# Patient Record
Sex: Female | Born: 1960 | Race: Black or African American | Hispanic: No | Marital: Married | Smoking: Former smoker
Health system: Southern US, Community
[De-identification: ages and names within clinical notes are randomized; demographics above are authoritative.]

## PROBLEM LIST (undated history)

## (undated) DIAGNOSIS — M3320 Polymyositis, organ involvement unspecified: Secondary | ICD-10-CM

## (undated) DIAGNOSIS — E785 Hyperlipidemia, unspecified: Secondary | ICD-10-CM

## (undated) DIAGNOSIS — IMO0001 Reserved for inherently not codable concepts without codable children: Secondary | ICD-10-CM

## (undated) DIAGNOSIS — I2510 Atherosclerotic heart disease of native coronary artery without angina pectoris: Secondary | ICD-10-CM

## (undated) DIAGNOSIS — M1990 Unspecified osteoarthritis, unspecified site: Secondary | ICD-10-CM

## (undated) DIAGNOSIS — F32A Depression, unspecified: Secondary | ICD-10-CM

## (undated) DIAGNOSIS — J189 Pneumonia, unspecified organism: Secondary | ICD-10-CM

## (undated) DIAGNOSIS — G7100 Muscular dystrophy, unspecified: Secondary | ICD-10-CM

## (undated) DIAGNOSIS — F329 Major depressive disorder, single episode, unspecified: Secondary | ICD-10-CM

## (undated) DIAGNOSIS — K219 Gastro-esophageal reflux disease without esophagitis: Secondary | ICD-10-CM

## (undated) DIAGNOSIS — K635 Polyp of colon: Secondary | ICD-10-CM

## (undated) DIAGNOSIS — Z8742 Personal history of other diseases of the female genital tract: Secondary | ICD-10-CM

## (undated) DIAGNOSIS — M810 Age-related osteoporosis without current pathological fracture: Secondary | ICD-10-CM

## (undated) DIAGNOSIS — R079 Chest pain, unspecified: Secondary | ICD-10-CM

## (undated) DIAGNOSIS — D649 Anemia, unspecified: Secondary | ICD-10-CM

## (undated) DIAGNOSIS — I10 Essential (primary) hypertension: Secondary | ICD-10-CM

## (undated) DIAGNOSIS — K5790 Diverticulosis of intestine, part unspecified, without perforation or abscess without bleeding: Secondary | ICD-10-CM

## (undated) DIAGNOSIS — R7303 Prediabetes: Secondary | ICD-10-CM

## (undated) HISTORY — DX: Atherosclerotic heart disease of native coronary artery without angina pectoris: I25.10

## (undated) HISTORY — PX: ENDOMETRIAL ABLATION: SHX621

## (undated) HISTORY — PX: LEEP: SHX91

## (undated) HISTORY — PX: COLONOSCOPY W/ BIOPSIES: SHX1374

## (undated) HISTORY — DX: Gastro-esophageal reflux disease without esophagitis: K21.9

## (undated) HISTORY — DX: Diverticulosis of intestine, part unspecified, without perforation or abscess without bleeding: K57.90

## (undated) HISTORY — DX: Hyperlipidemia, unspecified: E78.5

## (undated) HISTORY — DX: Personal history of other diseases of the female genital tract: Z87.42

## (undated) HISTORY — DX: Polyp of colon: K63.5

## (undated) HISTORY — DX: Prediabetes: R73.03

---

## 1998-07-06 ENCOUNTER — Ambulatory Visit (HOSPITAL_COMMUNITY): Admission: RE | Admit: 1998-07-06 | Discharge: 1998-07-06 | Payer: Self-pay | Admitting: *Deleted

## 1998-07-11 ENCOUNTER — Other Ambulatory Visit: Admission: RE | Admit: 1998-07-11 | Discharge: 1998-07-11 | Payer: Self-pay | Admitting: *Deleted

## 1998-09-02 ENCOUNTER — Other Ambulatory Visit: Admission: RE | Admit: 1998-09-02 | Discharge: 1998-09-02 | Payer: Self-pay | Admitting: *Deleted

## 1999-03-09 ENCOUNTER — Other Ambulatory Visit: Admission: RE | Admit: 1999-03-09 | Discharge: 1999-03-09 | Payer: Self-pay | Admitting: *Deleted

## 1999-03-10 ENCOUNTER — Encounter (INDEPENDENT_AMBULATORY_CARE_PROVIDER_SITE_OTHER): Payer: Self-pay

## 1999-03-10 ENCOUNTER — Other Ambulatory Visit: Admission: RE | Admit: 1999-03-10 | Discharge: 1999-03-10 | Payer: Self-pay | Admitting: *Deleted

## 1999-06-12 ENCOUNTER — Encounter (INDEPENDENT_AMBULATORY_CARE_PROVIDER_SITE_OTHER): Payer: Self-pay

## 1999-06-12 ENCOUNTER — Observation Stay (HOSPITAL_COMMUNITY): Admission: RE | Admit: 1999-06-12 | Discharge: 1999-06-13 | Payer: Self-pay | Admitting: *Deleted

## 2000-05-07 ENCOUNTER — Encounter: Admission: RE | Admit: 2000-05-07 | Discharge: 2000-05-28 | Payer: Self-pay | Admitting: Internal Medicine

## 2000-06-21 ENCOUNTER — Encounter (INDEPENDENT_AMBULATORY_CARE_PROVIDER_SITE_OTHER): Payer: Self-pay | Admitting: *Deleted

## 2000-06-21 ENCOUNTER — Ambulatory Visit (HOSPITAL_COMMUNITY): Admission: RE | Admit: 2000-06-21 | Discharge: 2000-06-21 | Payer: Self-pay | Admitting: Gastroenterology

## 2000-11-20 ENCOUNTER — Other Ambulatory Visit: Admission: RE | Admit: 2000-11-20 | Discharge: 2000-11-20 | Payer: Self-pay | Admitting: *Deleted

## 2001-12-10 ENCOUNTER — Other Ambulatory Visit: Admission: RE | Admit: 2001-12-10 | Discharge: 2001-12-10 | Payer: Self-pay | Admitting: *Deleted

## 2002-12-30 ENCOUNTER — Other Ambulatory Visit: Admission: RE | Admit: 2002-12-30 | Discharge: 2002-12-30 | Payer: Self-pay | Admitting: *Deleted

## 2003-04-18 ENCOUNTER — Encounter: Payer: Self-pay | Admitting: Hematology and Oncology

## 2003-04-18 ENCOUNTER — Encounter: Payer: Self-pay | Admitting: Emergency Medicine

## 2003-04-18 ENCOUNTER — Inpatient Hospital Stay (HOSPITAL_COMMUNITY): Admission: EM | Admit: 2003-04-18 | Discharge: 2003-04-21 | Payer: Self-pay | Admitting: Emergency Medicine

## 2003-05-17 ENCOUNTER — Encounter: Payer: Self-pay | Admitting: Emergency Medicine

## 2003-05-17 ENCOUNTER — Emergency Department (HOSPITAL_COMMUNITY): Admission: EM | Admit: 2003-05-17 | Discharge: 2003-05-18 | Payer: Self-pay | Admitting: Emergency Medicine

## 2003-07-07 ENCOUNTER — Ambulatory Visit (HOSPITAL_COMMUNITY): Admission: RE | Admit: 2003-07-07 | Discharge: 2003-07-08 | Payer: Self-pay | Admitting: General Surgery

## 2003-07-07 ENCOUNTER — Encounter (INDEPENDENT_AMBULATORY_CARE_PROVIDER_SITE_OTHER): Payer: Self-pay | Admitting: Specialist

## 2004-03-29 ENCOUNTER — Ambulatory Visit (HOSPITAL_COMMUNITY): Admission: RE | Admit: 2004-03-29 | Discharge: 2004-03-29 | Payer: Self-pay | Admitting: Gastroenterology

## 2004-03-29 ENCOUNTER — Encounter (INDEPENDENT_AMBULATORY_CARE_PROVIDER_SITE_OTHER): Payer: Self-pay | Admitting: *Deleted

## 2004-08-27 HISTORY — PX: LEEP: SHX91

## 2007-04-29 ENCOUNTER — Encounter: Admission: RE | Admit: 2007-04-29 | Discharge: 2007-04-29 | Payer: Self-pay | Admitting: Obstetrics and Gynecology

## 2007-05-04 ENCOUNTER — Encounter: Admission: RE | Admit: 2007-05-04 | Discharge: 2007-05-04 | Payer: Self-pay | Admitting: Interventional Radiology

## 2007-05-27 ENCOUNTER — Encounter: Admission: RE | Admit: 2007-05-27 | Discharge: 2007-05-27 | Payer: Self-pay | Admitting: Interventional Radiology

## 2008-05-19 ENCOUNTER — Ambulatory Visit (HOSPITAL_COMMUNITY): Admission: RE | Admit: 2008-05-19 | Discharge: 2008-05-20 | Payer: Self-pay | Admitting: Interventional Radiology

## 2008-05-19 HISTORY — PX: UTERINE ARTERY EMBOLIZATION: SHX2629

## 2008-06-09 ENCOUNTER — Encounter: Admission: RE | Admit: 2008-06-09 | Discharge: 2008-06-09 | Payer: Self-pay | Admitting: Interventional Radiology

## 2008-12-29 ENCOUNTER — Encounter: Admission: RE | Admit: 2008-12-29 | Discharge: 2008-12-29 | Payer: Self-pay | Admitting: Obstetrics and Gynecology

## 2008-12-29 ENCOUNTER — Encounter: Admission: RE | Admit: 2008-12-29 | Discharge: 2008-12-29 | Payer: Self-pay | Admitting: Interventional Radiology

## 2010-03-17 ENCOUNTER — Encounter: Admission: RE | Admit: 2010-03-17 | Discharge: 2010-03-17 | Payer: Self-pay | Admitting: Obstetrics and Gynecology

## 2010-09-17 ENCOUNTER — Encounter: Payer: Self-pay | Admitting: Interventional Radiology

## 2010-12-18 ENCOUNTER — Other Ambulatory Visit: Payer: Self-pay | Admitting: Obstetrics and Gynecology

## 2010-12-18 DIAGNOSIS — Z1231 Encounter for screening mammogram for malignant neoplasm of breast: Secondary | ICD-10-CM

## 2011-01-12 NOTE — Procedures (Signed)
[place]  Patient:    Jessica Burke, Jessica Burke                    MRN: [id number] Proc. Date: 06/21/00 Adm. Date:  [id number] Attending:  Charna Elizabeth CC:         Jenel Lucks, M.D., [facility].   Procedure Report  DATE OF BIRTH:  08-Apr-1961  PROCEDURE PERFORMED:  Colonoscopy with snare polypectomy x 2 and hot biopsy x 1.  ENDOSCOPIST:  Anselmo Rod, M.D.  INSTRUMENT USED:  Olympus video colonoscope.  INDICATIONS:  Rectal bleeding in a 50 year old black female.  Rule out colonic polyps, masses, hemorrhoids, etc.  PREPROCEDURE PREPARATION:  Informed consent was procured from the patient. The patient was fasted for 8 hours prior to the procedure and prepped with a bottle of magnesium citrate and a gallon of NuLytely the night prior to the procedure.  PREPROCEDURE PHYSICAL:  Patient has stable vital signs.  NECK: Supple.  CHEST:  Clear to auscultation. S1, S2 regular.  ABDOMEN:  Soft with normal abdominal bowel sounds.  DESCRIPTION OF PROCEDURE:  The patient was placed in left lateral decubitus position and sedated with 40 mg of Demerol and 4 mg of Versed intravenously. Once the patient was adequately sedated and maintained on low-flow oxygen and continuous cardiac monitoring, the Olympus video colonoscope was advanced from the rectum to the cecum without difficulty.  The patient has large pedunculated polyp at the hepatic flexure that was snared x 2.  Initially one-half of the polyp was removed and then the second half was removed without difficulty.  There was no bleeding from the stump.  A small sessile polyp was removed by hot biopsy from the right colon distal to the cecum.  There was a large amount of residual stool in the colon.  Very small lesions may have been missed.  IMPRESSION: 1. Large pedunculated polyp at the hepatic flexure. 2. Small sessile polyp in the right colon distal to the  cecum.  RECOMMENDATIONS: 1. Await pathology results. 2. Avoid nonsteroidals for the next two weeks. 3. Outpatient follow-up in the next two weeks. DD:  06/21/00 TD:  06/22/00 Job: [id number]

## 2011-01-12 NOTE — Consult Note (Signed)
NAME:  Jessica Burke, Jessica Burke NO.:  [id number]   MEDICAL RECORD NO.:  [id number]                   PATIENT TYPE:  INP   LOCATION:  0101                                 FACILITY:  [facility]   PHYSICIAN:  Erasmo Leventhal, M.D.         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  She slipped in the  water at home today and suffered a straddle-type injury to her groin; she  basically did a split.  She had pelvic pain with difficulty ambulating, and  was brought to the emergency room by ambulance.  There she was evaluated  initially and admitted to the hospitalist.  Initial x-rays were negative.  CT scan was suggestive of possible fracture, and I was consulted.  There was  no neck pain, loss of consciousness, or head injury with this.  It was a  simple fall.   MEDICAL AND SURGICAL HISTORY:  Please see documented.   PHYSICAL EXAMINATION:  GENERAL:  The patient is awake and alert, answers  questions appropriately, oriented to person, place, time and circumstance.  Denies any head, neck, shoulder, or upper extremity pain.  No pain in the  left lower extremity.  Mostly points to the suprapubic region as being the  area of pain, and also in the inner groin region, both sides.  NECK:  Unremarkable.  Bilateral upper extension moves unremarkable.  Left  lower extension moves well.  Right lower extension moves, but with guarding  somewhat.  PELVIC:  Compression reveals it to be stable.  No obvious ecchymosis or  swelling, with chaperone the entire exam.  The groin ridge was palpated, no  palpable defect.  No significant swelling, but mild tenderness there.  EXTREMITIES:  Distal pulses were intact.  Moves feet and ankles very well.  Good sensation to  light touch.   X-RAYS:  Plain x-rays of the pelvis unremarkable, as was her hip.   CT SCAN:  Suggestive of possible nondisplaced right pubic fracture.  Posterior elements appear to be intact.   IMPRESSION:  Bilateral groin, hip adductor strain, probable nondisplaced  right pubic fracture.   RECOMMENDATIONS:  At this point in time, these are stable injuries - the  strains and the pubic fracture.  She may be weightbearing as tolerated.  She  will be admitted for pain control to the hospitalist, physical therapy, OT  consults, weightbearing as tolerated, pain control.  Also recommend for deep  vein thrombosis prophylaxis while in the bed.  Bilateral knee-high TED hose,  and bilateral pedal PlexiPulse, and also recommend calcium at 500 mg three  times a day with food, and vitamin D 400 international units per day.  Pain  control per hospitalist.  Will follow  her in the hospital.  Once her pain is  relieved, and she can satisfactorily move about, would recommend discharge  to home.  Will follow her in the office.  All questions encouraged and  answered for patient.                                               Erasmo Leventhal, M.D.    RAC/MEDQ  D:  04/18/2003  T:  04/18/2003  Job:  [id number]

## 2011-01-12 NOTE — Op Note (Signed)
NAME:  Jessica Burke, Jessica Burke NO.:  [id number]   MEDICAL RECORD NO.:  [id number]                   PATIENT TYPE:  OIB   LOCATION:  5735                                 FACILITY:  [facility]   PHYSICIAN:  Leonie Man, M.D.                DATE OF BIRTH:  [birth year]   DATE OF PROCEDURE:  07/07/2003  DATE OF DISCHARGE:                                 OPERATIVE REPORT   PREOPERATIVE DIAGNOSIS:  Chronic calculous cholecystitis, symptomatic.   POSTOPERATIVE DIAGNOSIS:  Chronic calculous cholecystitis, symptomatic.   OPERATION PERFORMED:  Laparoscopic cholecystectomy with intraoperative  cholangiogram.   SURGEON:  Leonie Man, M.D.   ASSISTANT:  Joanne Gavel, M.D.   ANESTHESIA:  General.   INDICATIONS FOR PROCEDURE:  The patient is a 50 year old woman who presents  with abdominal pain extending from the epigastrium to the right upper  quadrant associated with nausea and vomiting.  Gallbladder ultrasound shows  stones.  She comes to the operating room now after the risks and potential  benefits of surgery have been fully discussed, all questions answered and  consent obtained.   DESCRIPTION OF PROCEDURE:  Following the induction of satisfactory general  anesthesia, the patient was positioned supinely.  The abdomen was routinely  prepped and draped to be included in the sterile operative field.  Open  laparoscopy created at the umbilicus with insertion of a Hasson cannula and  insufflation of the peritoneal cavity to 15 mmHg using carbon dioxide.  Visual exploration of the abdomen carried out.  Liver surface was smooth.  The liver edge was sharp.  Anterior gastric wall appeared to be normal.  Duodenal sweep appeared to be normal.  There were some adhesions to the  gallbladder from the duodenum.  None of the small or large intestine viewed  appeared to be abnormal.  Under direct vision, epigastric and lateral ports  were placed.  The gallbladder was grasped and  retracted cephalad and  dissection carried down to the region of the ampulla.  A fifth port had to  be placed so as to get better visualization of the ampullary region.  The  cystic duct was then isolated and was noted to be a very large, thick-walled  cystic duct with a stone wedged within it.  The cystic artery was also  identified and in the course of isolating the cystic artery, the cystic  artery was entered and the bleeding was subsequently controlled with clips.  The large cystic duct was then opened.  Multiple stones within the cystic  duct were milked out and the cystic duct was irrigated.  A cystic duct  cholangiogram was carried out by passing a Cook catheter into the abdomen  and placing it into the cystic duct. Since the duct cholangiogram was  carried out with one half strength Hypaque being injected into the  extrahepatic biliary system.  The resulting cholangiogram showed normal  caliber duct with no  filling defects identified.  The cystic duct catheter  was removed and the cystic duct was doubly clipped and then an Endoloop was  placed around the __________ very large cystic duct.  The gallbladder was  then dissected free from the liver bed by using electrocautery and  maintaining hemostasis throughout the entire course of the dissection.  During the course of the dissection, the gallbladder was opened and multiple  stones were extruded, so after the gallbladder was removed and placed in an  EndoCatch, the stones were retrieved using irrigation and stone scooper to  retrieve the stones.  At the end of the procedure, the liver bed was again  inspected and noted to be dry.  The camera was moved to the epigastric port  and the gallbladder was retrieved through the umbilical port without  difficulty.  Sponge, instrument and sharp counts were then verified.  Pneumoperitoneum allowed to deflate and the wounds closed in layers as  follows.  Umbilical wound in two layers with 0  Vicryl and 4-0 Monocryl.  Epigastric and lateral flank wounds as well as the addition ports were  closed with running 4-0 Monocryl sutures.  All wounds were reinforced with  Steri-Strips and sterile dressings applied.  Anesthetic was reversed and the  patient removed from the operating room to the recovery room in stable  condition, having tolerated the procedure well.  She tolerated the procedure  well.                                               Leonie Man, M.D.    PB/MEDQ  D:  07/07/2003  T:  07/08/2003  Job:  [id number]

## 2011-01-12 NOTE — H&P (Signed)
NAME:  Jessica Burke, Jessica Burke                       ACCOUNT NO.:  [id number]   MEDICAL RECORD NO.:  [id number]                   PATIENT TYPE:  INP   LOCATION:  0101                                 FACILITY:  [facility]   PHYSICIAN:  Alfonse Spruce, M.D.               DATE OF BIRTH:  Oct 16, 1960   DATE OF ADMISSION:  04/18/2003  DATE OF DISCHARGE:                                HISTORY & PHYSICAL   PRIMARY CARE PHYSICIAN RHEUMATOLOGIST:  Lemmie Evens, M.D.   HISTORY AND CHIEF COMPLAINT:  The patient admitted through the emergency  department, brought by ambulance to the emergency room after she had a fall  in her kitchen with split legs.  With split legs, landed on her pelvis  anteriorly.   HISTORY OF PRESENT ILLNESS:  Jessica Burke who is a 50 year old African-  American female with a longstanding history of polymyositis, has been  treated by Dr. Jimmy Footman with multiple medications and has been checking on  her every six weeks.  Her current primary care physician is Dr. Juline Patch.  She stated that she went down the stairs to the kitchen and because  of the water from the dishwasher which overflowed, she slipped and landed on  her pelvis with open split legs and she could not move or stand up, with  severe pain, for 20 minutes.  Her husband came down and her daughter helped  her and were able to carry her up to her bed.  They gave her some Tylenol  and she tried to sleep, could not with the severe pain.  She wanted to go to  urinate, and she had significant difficulty going to the bathroom, and  although she had a history of weakness of the muscles of the pelvis with  incontinence associated with longstanding history of polymyositis, for  almost a 20 year history of polymyositis.   CURRENT MEDICATIONS:  1. Medrol 4 mg.  2. Hydrochlorothiazide 25 mg, she takes 1/2 tablet q.a.m. for mild     hypertension.  3. Azathioprine 50 mg, she takes 200 mg a day, equal to 4 tablets.  4. She  also takes Enbrel 25 mg subcu twice a week, the last one was done on     Friday 04/16/2003.   I believe the  polymyositis was diagnosed in 1997.   FAMILY HISTORY:  She is married.  She is gravida 1, para 1.  She has two  brothers who are healthy.  Her mother lived a life and had some knee  problem, mild hypertension.  Her father died of IHSS heart disease at the  age of 29.   SOCIAL HISTORY:  She is married and is not a smoker and nondrinker.  She  retired with disability for the [employer].  She is gravida 1, para 1 with  the bikini incision for cesarean section.  She has one daughter.  She had  prior  to the treatment, she had apparent weakness and frequent falls, but  she went to the therapy and she got a little better.  She has no falls since  last year.  There is no history of diabetes mellitus, no history of peptic  ulcer disease.  She has a mild history of hypertension.  She had no thyroid  disease and no other problem.  Currently she is on her menses from the 2nd  day and usually lasts 3 days, moderate and 2-3 spotting, and history of  endometrial proliferation.  She had a muscle biopsy for polymyositis in the  past, and she had a colonoscopy in 2001, by Dr. Loreta Ave and removed the polyp  which was benign.  History of GERD disease, and she uses Prevacid tablets on  a p.r.n. basis for acid reflux disease.   REVIEW OF SYSTEMS:  NEUROPSYCHIATRIC:  No history of depression or stroke.  GI : She had a history of GERD with heartburn, no nausea, no vomiting, no  diarrhea, on constipation and no bloody stools and no vomiting or  hematemesis or melena.  PULMONARY:  No hemoptysis and no cough or  expectoration, and she had no wheezing, no rhonchi, no history of asthma.  GU:  She had a history of urinary incontinence intermittently.  ENDOCRINE:  No thyroid disease.  No diabetes mellitus.  MUSCULOSKELETAL:  She has  polymyositis.   PHYSICAL EXAMINATION:  GENERAL:  The patient is a  50 year old African-  American female.  She is conscious, alert and oriented x3.  Her husband is  bedside.  No nausea, no vomiting, no acute respiratory distress.  She had  however severe pain 10/10 with minimal movement of her pelvis or hip and she  is extremely tender on the 7th pubis, as well as bilateral right and left  hip.  No tenderness on the 2nd, and no tenderness on the lumbosacral spine.  HEENT:  The head was normocephalic and atraumatic.  The pupils were equal  and reactive.  NECK:  Supple.  No JVD.  No thyromegaly.  No lymphadenopathy.  Trachea  midline.  CHEST:  Clear to auscultation and percussion.  HEART:  PMI in the first intercostal space, normal S1, S2, no S3, no S4 and  no gallop, and the axilla and no lymph nodes, supraclavicular no lymph node,  and the trachea midline.  ABDOMEN:  Soft, positive bowel sounds.  In the upper quadrant no tenderness;  however on the lower quadrant suprapubic, as well as in the inguinal and the  right and left iliac fossa she has some significant tenderness with pain.  She had a bikini cesarean section in the past.  NEUROLOGIC:  Grossly intact, and the cranial nerves, and the deep tendon  reflexes were equal bilaterally.  Deep tendon reflexes 1/2.  Sensation  intact bilaterally.  VITAL SIGNS:  Temperature 98.6, pulse 86, respirations 20, pulse oximetry  100% on room air.   LABORATORY DATA:  Sodium 137, potassium 3.4 mildly below the normal range,  chloride 108, CO2 26, BUN 8, creatinine 0.4, total protein 6, albumin 3,  SGOT 10, SGPT 12, alkaline phosphatase 49, total bilirubin 06 with normal  labs.   FINAL IMPRESSION:  1. Fall with slide on water in her kitchen with splitting of wide open     pelvis with split legs, with severe intractable pain, associated with     inability to walk or carry her weight on her legs.  The initial x-ray was  negative and proceeding with the CAT scan of the pelvis and the hip, and    discussed with  the radiologist to proceed on, and if there is not     finding, we will proceed with MRI.  2. History of polymyositis, has been under treatment with steroid     azathioprine.  3. Rule out aseptic necrosis of the hips.  4. Mild hypertension.  5. Obesity.   PLAN:  The patient will be to hospital with the lower extremity weakness.  The patient will be admitted to the hospital, CAT scan of the hip and the  pelvis, and will consult rheumatology, Dr. Jimmy Footman tomorrow after  stabilization and obtaining the result, as well as obtain the labs tomorrow  as well.  Pain control with morphine as well as Vicodin, and future  rehabilitation as inpatient if needed.  We will start intravenous fluids and  a Foley catheter, and supply with potassium and intravenous fluid, and  recheck the labs in the a.m.  Her EKG also obtained and she had a left  ventricular hypertrophy by voltage only.  No blocks.  No right or left  bundle branch blocks.  Her urinalysis will be requested to be done, as from  the Foley catheter, with the culture and sensitivity.  Continuation of the  current medication with the hypothyroidism.                                                Alfonse Spruce, M.D.   Wynn Maudlin  D:  04/18/2003  T:  04/18/2003  Job:  [id number]

## 2011-01-12 NOTE — Discharge Summary (Signed)
NAME:  Jessica Burke, Jessica Burke                       ACCOUNT NO.:  [id number]   MEDICAL RECORD NO.:  [id number]                   PATIENT TYPE:  INP   LOCATION:  0446                                 FACILITY:  [facility]   PHYSICIAN:  Alfonse Spruce, M.D.               DATE OF BIRTH:  1960/10/11   DATE OF ADMISSION:  04/18/2003  DATE OF DISCHARGE:  04/21/2003                                 DISCHARGE SUMMARY   IDENTIFYING INFORMATION:  Age 50 years old African-American female.   DISCHARGE DIAGNOSES:  1. Acute nondisplaced fracture of the anterior pelvis secondary to acute     fall.  2. Polymyositis with weakness of the lower extremities.  3. Hypertension.  4. Walking disability.  5. Mild anemia secondary to chronic disease.   DISCHARGE MEDICATIONS:  1. Os-Cal 500 mg t.i.d.  2. Vicodin tablet p.o. q.8h. and p.r.n. for pain control.  3. Altace 2.5 mg p.o. b.i.d.  4. Vioxx 25 mg p.o. q. daily.  5. Imuran or azathioprine will be held until the patient is seen by Dr.     Jimmy Footman, the rheumatologist, in one to two weeks.  6. Enbrel 25 mg subcu twice a week will be held until seen by Dr. Jimmy Footman,     rheumatology, in one to two weeks.   HOSPITAL COURSE:  The patient who is a 50 years old African-American female  admitted to the hospital after severe pain in the pelvis and inability to  walk.  The patient was admitted from the emergency room with severe pain,  intractable, with walking disability.  Initially, thought that she may have  a fracture in the hips, however, the CAT scan indicating a non-displaced  acute fracture of the anterior pelvis.  The patient seen by consultation  with Dr. Erasmo Leventhal, the orthopedist, who did see her also in the  ER as well, and followed her through the hospital course.  Patient  recommendation was rehabilitation, and she preferred to go to home rehab as  well as the rehabilitation consultation recommended home rehab.  The pain  control was  improved, first with the use of Dilaudid and subsequently used  Vicodin and Vioxx, and she is gradually improving, and she will have a  limited amount of Vicodin until she will be seen by Dr. Jimmy Footman and Dr.  Ricki Miller, the PCP.  The patient will be continuing her diet, regular, and  activity as tolerated with the physical therapy, will be followed with home  health care and rehab at home.  She will be followed by Dr. Jimmy Footman, the  rheumatologist, and Dr. Ricki Miller in one to two weeks.  On discharge, her vital  signs are stable with a blood pressure of 149/85.  Her last electrolyte was  within normal limits.  The renal function also was normal with a BUN of 6  and creatinine of 0.7.  Her hemoglobin was 11.2 and hematocrit 33.6,  with  mild anemia.  She had a mild elevation of the leukocytes with the increase  of the Medrol as recommended by Dr. Jimmy Footman, rheumatology, to be increased  in her acute distress time with pain.  The patient will continue her current  medications and will be followed with the above physicians as mentioned.                                               Alfonse Spruce, M.D.   Wynn Maudlin  D:  04/21/2003  T:  04/21/2003  Job:  [id number]

## 2011-01-12 NOTE — Op Note (Signed)
NAME:  Jessica Burke, Jessica Burke                       ACCOUNT NO.:  [id number]   MEDICAL RECORD NO.:  [id number]                   PATIENT TYPE:  AMB   LOCATION:  ENDO                                 FACILITY:  [facility]   PHYSICIAN:  Anselmo Rod, M.D.               DATE OF BIRTH:  [birth year]   DATE OF PROCEDURE:  03/29/2004  DATE OF DISCHARGE:                                 OPERATIVE REPORT   PROCEDURE:  Colonoscopy with snare polypectomy x 1.   ENDOSCOPIST:  Charna Elizabeth, M.D.   INSTRUMENT USED:  Olympus video colonoscope.   INDICATIONS FOR PROCEDURE:  50 year old [place] American female with a  history of colon family in her family and personal history of adenomatous  polyps undergoing repeat colonoscopy to rule out recurrent polyps.   PREPROCEDURE PREPARATION:  Informed consent was obtained from the patient.  The patient was fasted for eight hours prior to the procedure and prepped  with a bottle of magnesium citrate and a gallon of GoLYTELY the night prior  to the procedure.   PREPROCEDURE PHYSICAL:  Patient with stable vital signs.  Neck supple.  Chest clear to auscultation.  S1 and S2 regular.  Abdomen soft with normal  bowel sounds.   DESCRIPTION OF PROCEDURE:  The patient was placed in the left lateral  decubitus position, sedated with 75 mg of Demerol and 7.5 mg Versed given in  slow incremental doses.  Once the patient was adequately sedated, maintained  on low flow oxygen and continuous cardiac monitoring, the Olympus video  colonoscope was advanced from the rectum to the cecum.  There was some  residual stool in the right colon, multiple washes were done.  A 4-5 mm  sessile polyp was snared from the distal right colon.  There were a few  early scattered diverticula in the left colon.  Retroflexion in the rectum  revealed no abnormalities.   IMPRESSION:  1. Sessile polyp snared from distal right colon.  2. A few left sided diverticula.  3. Some stool in the right  colon.   RECOMMENDATIONS:  1. Await pathology results.  2. Avoid nonsteroidals including aspirin for the next four weeks.  3. Repeat colonoscopy depending on the pathology results.  4. Outpatient follow up as need arises in the future.                                               Anselmo Rod, M.D.    JNM/MEDQ  D:  03/29/2004  T:  03/29/2004  Job:  [id number]   cc:   Juline Patch, M.D.  [address]  [place], [place] [address]  Fax: [phone]

## 2011-03-19 ENCOUNTER — Ambulatory Visit
Admission: RE | Admit: 2011-03-19 | Discharge: 2011-03-19 | Disposition: A | Discharge status: Home / Self Care | Payer: PRIVATE HEALTH INSURANCE | Source: Ambulatory Visit | Attending: Obstetrics and Gynecology | Admitting: Obstetrics and Gynecology

## 2011-03-19 DIAGNOSIS — Z1231 Encounter for screening mammogram for malignant neoplasm of breast: Secondary | ICD-10-CM

## 2011-05-28 LAB — COMPREHENSIVE METABOLIC PANEL
ALT: 20
AST: 31
Albumin: 3 — ABNORMAL LOW
Alkaline Phosphatase: 47
BUN: 12
CO2: 28
Calcium: 8.5
Chloride: 108
Creatinine, Ser: 0.31 — ABNORMAL LOW
GFR calc Af Amer: >60
GFR calc non Af Amer: >60
Glucose, Bld: 103 — ABNORMAL HIGH
Potassium: 4.5
Sodium: 142
Total Bilirubin: 0.2 — ABNORMAL LOW
Total Protein: 5.9 — ABNORMAL LOW

## 2011-05-28 LAB — CBC
HCT: 34.2 — ABNORMAL LOW
Hemoglobin: 11.2 — ABNORMAL LOW
MCHC: 32.6
MCV: 86.9
Platelets: 225
RBC: 3.93
RDW: 16.1 — ABNORMAL HIGH
WBC: 8.2

## 2011-05-28 LAB — HCG, SERUM, QUALITATIVE: Preg, Serum: NEGATIVE

## 2011-10-18 ENCOUNTER — Encounter (HOSPITAL_COMMUNITY): Payer: Self-pay | Admitting: Internal Medicine

## 2011-10-18 ENCOUNTER — Other Ambulatory Visit: Payer: Self-pay

## 2011-10-18 ENCOUNTER — Emergency Department (HOSPITAL_COMMUNITY)
Admission: EM | Admit: 2011-10-18 | Discharge: 2011-10-18 | Disposition: A | Discharge status: Home Health | Payer: No Typology Code available for payment source | Attending: Emergency Medicine | Admitting: Emergency Medicine

## 2011-10-18 ENCOUNTER — Emergency Department (HOSPITAL_COMMUNITY): Payer: No Typology Code available for payment source

## 2011-10-18 DIAGNOSIS — R6884 Jaw pain: Secondary | ICD-10-CM | POA: Insufficient documentation

## 2011-10-18 DIAGNOSIS — M25519 Pain in unspecified shoulder: Secondary | ICD-10-CM | POA: Insufficient documentation

## 2011-10-18 DIAGNOSIS — R0602 Shortness of breath: Secondary | ICD-10-CM | POA: Insufficient documentation

## 2011-10-18 DIAGNOSIS — R000 Tachycardia, unspecified: Secondary | ICD-10-CM | POA: Insufficient documentation

## 2011-10-18 DIAGNOSIS — I10 Essential (primary) hypertension: Secondary | ICD-10-CM | POA: Insufficient documentation

## 2011-10-18 DIAGNOSIS — M79609 Pain in unspecified limb: Secondary | ICD-10-CM | POA: Insufficient documentation

## 2011-10-18 DIAGNOSIS — R110 Nausea: Secondary | ICD-10-CM | POA: Insufficient documentation

## 2011-10-18 DIAGNOSIS — M542 Cervicalgia: Secondary | ICD-10-CM | POA: Insufficient documentation

## 2011-10-18 DIAGNOSIS — M3320 Polymyositis, organ involvement unspecified: Secondary | ICD-10-CM

## 2011-10-18 DIAGNOSIS — R0789 Other chest pain: Secondary | ICD-10-CM | POA: Insufficient documentation

## 2011-10-18 HISTORY — DX: Polymyositis, organ involvement unspecified: M33.20

## 2011-10-18 HISTORY — DX: Essential (primary) hypertension: I10

## 2011-10-18 LAB — POCT I-STAT, CHEM 8
BUN: 8 mg/dL (ref 6–23)
Calcium, Ion: 1.14 mmol/L (ref 1.12–1.32)
Chloride: 103 mEq/L (ref 96–112)
Creatinine, Ser: 0.3 mg/dL — ABNORMAL LOW (ref 0.50–1.10)
Glucose, Bld: 90 mg/dL (ref 70–99)
HCT: 40 % (ref 36.0–46.0)
Hemoglobin: 13.6 g/dL (ref 12.0–15.0)
Potassium: 3.8 mEq/L (ref 3.5–5.1)
Sodium: 140 mEq/L (ref 135–145)
TCO2: 28 mmol/L (ref 0–100)

## 2011-10-18 LAB — TROPONIN I: Troponin I: <0.3 ng/mL (ref <0.30)

## 2011-10-18 NOTE — ED Notes (Signed)
Patient is dressed and has gather all possessions and is in hospital wheelchair waiting for discharge. RN aware.

## 2011-10-18 NOTE — ED Notes (Signed)
Left sided chest pain, radiating to left jaw--started at 1:30am.

## 2011-10-18 NOTE — Discharge Instructions (Signed)
Chest Pain (Nonspecific) It is often hard to give a specific diagnosis for the cause of chest pain. There is always a chance that your pain could be related to something serious, such as a heart attack or a blood clot in the lungs. You need to follow up with your caregiver for further evaluation. CAUSES   Heartburn.   Pneumonia or bronchitis.   Anxiety and stress.   Inflammation around your heart (pericarditis) or lung (pleuritis or pleurisy).   A blood clot in the lung.   A collapsed lung (pneumothorax). It can develop suddenly on its own (spontaneous pneumothorax) or from injury (trauma) to the chest.  The chest wall is composed of bones, muscles, and cartilage. Any of these can be the source of the pain.  The bones can be bruised by injury.   The muscles or cartilage can be strained by coughing or overwork.   The cartilage can be affected by inflammation and become sore (costochondritis).  DIAGNOSIS  Lab tests or other studies, such as X-rays, an EKG, stress testing, or cardiac imaging, may be needed to find the cause of your pain.  TREATMENT   Treatment depends on what may be causing your chest pain. Treatment may include:   Acid blockers for heartburn.   Anti-inflammatory medicine.   Pain medicine for inflammatory conditions.   Antibiotics if an infection is present.   You may be advised to change lifestyle habits. This includes stopping smoking and avoiding caffeine and chocolate.   You may be advised to keep your head raised (elevated) when sleeping. This reduces the chance of acid going backward from your stomach into your esophagus.   Most of the time, nonspecific chest pain will improve within 2 to 3 days with rest and mild pain medicine.  HOME CARE INSTRUCTIONS   If antibiotics were prescribed, take the full amount even if you start to feel better.   For the next few days, avoid physical activities that bring on chest pain. Continue physical activities as  directed.   Do not smoke cigarettes or drink alcohol until your symptoms are gone.   Only take over-the-counter or prescription medicine for pain, discomfort, or fever as directed by your caregiver.   Follow your caregiver's suggestions for further testing if your chest pain does not go away.   Keep any follow-up appointments you made. If you do not go to an appointment, you could develop lasting (chronic) problems with pain. If there is any problem keeping an appointment, you must call to reschedule.  SEEK MEDICAL CARE IF:   You think you are having problems from the medicine you are taking. Read your medicine instructions carefully.   Your chest pain does not go away, even after treatment.   You develop a rash with blisters on your chest.  SEEK IMMEDIATE MEDICAL CARE IF:   You have increased chest pain or pain that spreads to your arm, neck, jaw, back, or belly (abdomen).   You develop shortness of breath, an increasing cough, or you are coughing up blood.   You have severe back or abdominal pain, feel sick to your stomach (nauseous) or throw up (vomit).   You develop severe weakness, fainting, or chills.   You have an oral temperature above 102 F (38.9 C), not controlled by medicine.  THIS IS AN EMERGENCY. Do not wait to see if the pain will go away. Get medical help at once. Call your local emergency services (911 in [place] Do not drive yourself to   the hospital. MAKE SURE YOU:   Understand these instructions.   Will watch your condition.   Will get help right away if you are not doing well or get worse.  Document Released: 05/23/2005 Document Revised: 04/25/2011 Document Reviewed: 03/18/2008 [employer] Patient Information 2012 [employer]. 

## 2011-10-18 NOTE — ED Provider Notes (Signed)
History     CSN: [id number]  Arrival date & time 10/18/11  1008 PCP: Dr. Hilliard Clark  First MD Initiated Contact with Patient 10/18/11 1016      Chief Complaint  Patient presents with  . Chest Pain  . polymyositis    CC: Neck/Shoulder/Jaw pain   HPI 51 yo woman with PMH significant for polymyositis p/w acute onset, intermittent, 7/10 left crampy shoulder pain that began at 1am when she awoke to urinate, and radiates up jaw and down left arm (which she describes as more achy than baseline), accompanied by nausea but no vomiting, SOB or diaphoresis.  Pain is worse with mvmt.  No alleviating factors. She tried Zantac with no relief.  She came to ED upon probing of family members.  She is currently in no pain or distress.  This episode did feel differently than the muscle pains she has at baseline, which is why this was of concern.  No recent trauma or change in activity.   Past Medical History  Diagnosis Date  . Hypertension   . Polymyositis     Past Surgical History  Procedure Date  . Endometrial ablation   . Cesarean section   . Leep     Family History  Problem Relation Age of Onset  . Hypertension Father     History  Substance Use Topics  . Smoking status: Never Smoker   . Smokeless tobacco: Not on file  . Alcohol Use: No    OB History    Grav Para Term Preterm Abortions TAB SAB Ect Mult Living   1 1              Review of Systems  Constitutional: Negative for fever, chills, diaphoresis, activity change, appetite change and fatigue.  HENT: Positive for neck pain. Negative for ear pain, congestion, facial swelling, rhinorrhea, trouble swallowing, neck stiffness, voice change and tinnitus.   Eyes: Negative for photophobia, pain, discharge, redness and visual disturbance.  Respiratory: Negative for cough, chest tightness, wheezing and stridor.        SOB at baseline, no different  Cardiovascular: Negative for chest pain, palpitations and leg swelling.    Gastrointestinal: Positive for nausea. Negative for vomiting, abdominal pain, diarrhea, constipation, blood in stool and abdominal distention.  Genitourinary: Negative.   Skin: Negative.   Neurological: Negative for dizziness, tremors, syncope, speech difficulty, weakness, light-headedness, numbness and headaches.  Psychiatric/Behavioral: Negative.     Allergies  Review of patient's allergies indicates no known allergies.  Home Medications   Current Outpatient Rx  Name Route Sig Dispense Refill  . ALENDRONATE SODIUM 70 MG PO TABS Oral Take 70 mg by mouth every 7 (seven) days. Take with a full glass of water on an empty stomach.    . AZATHIOPRINE 50 MG PO TABS Oral Take 100 mg by mouth 2 (two) times daily.    Marland Kitchen VIACTIV PO Oral Take 2 Units by mouth daily.    Marland Kitchen PREDNISONE 5 MG PO TABS Oral Take 15 mg by mouth daily.    Marland Kitchen RANITIDINE HCL 150 MG PO TABS Oral Take 150 mg by mouth 2 (two) times daily as needed.    . TELMISARTAN-HCTZ 80-12.5 MG PO TABS Oral Take 1 tablet by mouth daily.      Myocardis, Azathioprine, Prednisone, Zantac are meds that patient had with her.  She takes fosamax every Friday.  BP 145/93  Pulse 113  Temp(Src) 97.9 F (36.6 C) (Oral)  Resp 18  SpO2 100%  Physical  Exam  Vitals reviewed. Constitutional: She is oriented to person, place, and time. She appears well-developed and well-nourished. No distress.  HENT:  Mouth/Throat: Oropharynx is clear and moist. No oropharyngeal exudate.  Eyes: Conjunctivae and EOM are normal. Pupils are equal, round, and reactive to light.  Neck: Normal range of motion. Neck supple. No JVD present. No tracheal deviation present. No thyromegaly present.  Cardiovascular: Regular rhythm, normal heart sounds and intact distal pulses.  Exam reveals no gallop and no friction rub.   No murmur heard.      tachycardic  Pulmonary/Chest: Effort normal and breath sounds normal. No respiratory distress. She has no wheezes. She has no rales. She  exhibits no tenderness.  Abdominal: Soft. Bowel sounds are normal. She exhibits no distension and no mass. There is no tenderness. There is no rebound and no guarding.       obese  Musculoskeletal: Normal range of motion. She exhibits no edema and no tenderness.       Left chest & arm not TTP  Lymphadenopathy:    She has no cervical adenopathy.  Neurological: She is alert and oriented to person, place, and time. No cranial nerve deficit. Coordination normal.  Skin: Skin is warm and dry. No rash noted. She is not diaphoretic. No erythema. No pallor.  Psychiatric: She has a normal mood and affect. Her behavior is normal.    ED Course  Procedures (including critical care time)  Labs Reviewed  POCT I-STAT, CHEM 8 - Abnormal; Notable for the following:    Creatinine, Ser 0.30 (*)    All other components within normal limits  TROPONIN I   Dg Chest 2 View  10/18/2011  *RADIOLOGY REPORT*  Clinical Data: Left-sided chest pressure and tightness, hypertension  CHEST - 2 VIEW  Comparison: None.  Findings: The lungs are not optimally aerated.  No focal infiltrate or effusion is seen.  The heart is mildly enlarged on this suboptimal inspiration film.  No acute bony abnormality is seen.  IMPRESSION: Poor inspiration.  No active lung disease.  Mild cardiomegaly.  Original Report Authenticated By: Juline Patch, M.D.     Date: 10/18/2011  Rate: 99  Rhythm: normal sinus rhythm  QRS Axis: left  Intervals: normal  ST/T Wave abnormalities: normal  Conduction Disutrbances:none  Narrative Interpretation: LVH  Old EKG Reviewed: changes noted, early precordial changes   1. Non-cardiac chest pain   2. Myalgia   3. Polymyositis       MDM  51 yo F with PMH significant for polymyositis and HTN p/w acute onset CP with nausea.  R/o ACS.  EKG does not suggest STEMI.  Order troponin (>6h after onset of symptoms) and iStat to assess Hb causing demand ischemia.  CXR to assess for acute cardiopulmonary  etiology such as dissection.  12:17 PM - No significant findings on CXR or iStat Chem 8.  Troponin still pending, but unlikely cardiac origin.        Vernice Jefferson, MD 10/18/11 1218

## 2011-10-18 NOTE — ED Provider Notes (Signed)
I saw and evaluated the patient, reviewed the resident's note and I agree with the findings and plan.  I personally evaluated the ECG and agree with the interpretation of the resident  1:08 PM The patient is without discomfort at this time.  Her pain is worse with movement and palpation.  This may likely be related to polymyositis.  I doubt this is ACS.  Doubt pulmonary embolism.  EKG and troponin normal.  Facial be referred back to her primary care Dr. as well as cardiology for outpatient evaluation.  My suspicion that this is ACS is very low  1. Chest pain   2. Polymyositis      Dg Chest 2 View  10/18/2011  *RADIOLOGY REPORT*  Clinical Data: Left-sided chest pressure and tightness, hypertension  CHEST - 2 VIEW  Comparison: None.  Findings: The lungs are not optimally aerated.  No focal infiltrate or effusion is seen.  The heart is mildly enlarged on this suboptimal inspiration film.  No acute bony abnormality is seen.  IMPRESSION: Poor inspiration.  No active lung disease.  Mild cardiomegaly.  Original Report Authenticated By: Juline Patch, M.D.   I personally reviewed the x-ray   Lyanne Co, MD 10/18/11 1309

## 2012-04-10 ENCOUNTER — Other Ambulatory Visit: Payer: Self-pay | Admitting: Obstetrics & Gynecology

## 2012-04-10 DIAGNOSIS — Z1231 Encounter for screening mammogram for malignant neoplasm of breast: Secondary | ICD-10-CM

## 2012-05-02 ENCOUNTER — Ambulatory Visit
Admission: RE | Admit: 2012-05-02 | Discharge: 2012-05-02 | Disposition: A | Discharge status: Home / Self Care | Payer: Medicare Other | Source: Ambulatory Visit | Attending: Obstetrics & Gynecology | Admitting: Obstetrics & Gynecology

## 2012-05-02 DIAGNOSIS — Z1231 Encounter for screening mammogram for malignant neoplasm of breast: Secondary | ICD-10-CM

## 2012-06-26 ENCOUNTER — Ambulatory Visit: Payer: Medicare Other | Attending: Internal Medicine | Admitting: Physical Therapy

## 2012-06-26 DIAGNOSIS — R262 Difficulty in walking, not elsewhere classified: Secondary | ICD-10-CM | POA: Insufficient documentation

## 2012-06-26 DIAGNOSIS — R5381 Other malaise: Secondary | ICD-10-CM | POA: Insufficient documentation

## 2012-06-26 DIAGNOSIS — R269 Unspecified abnormalities of gait and mobility: Secondary | ICD-10-CM | POA: Insufficient documentation

## 2012-06-26 DIAGNOSIS — IMO0001 Reserved for inherently not codable concepts without codable children: Secondary | ICD-10-CM | POA: Insufficient documentation

## 2012-06-26 DIAGNOSIS — M6281 Muscle weakness (generalized): Secondary | ICD-10-CM | POA: Insufficient documentation

## 2012-07-08 ENCOUNTER — Ambulatory Visit: Payer: Medicare Other | Attending: Internal Medicine | Admitting: Physical Therapy

## 2012-07-08 ENCOUNTER — Ambulatory Visit: Payer: Medicare Other | Admitting: Occupational Therapy

## 2012-07-08 DIAGNOSIS — M6281 Muscle weakness (generalized): Secondary | ICD-10-CM | POA: Insufficient documentation

## 2012-07-08 DIAGNOSIS — R5381 Other malaise: Secondary | ICD-10-CM | POA: Insufficient documentation

## 2012-07-08 DIAGNOSIS — R269 Unspecified abnormalities of gait and mobility: Secondary | ICD-10-CM | POA: Insufficient documentation

## 2012-07-08 DIAGNOSIS — IMO0001 Reserved for inherently not codable concepts without codable children: Secondary | ICD-10-CM | POA: Insufficient documentation

## 2012-07-08 DIAGNOSIS — R262 Difficulty in walking, not elsewhere classified: Secondary | ICD-10-CM | POA: Insufficient documentation

## 2012-07-11 ENCOUNTER — Ambulatory Visit: Payer: Medicare Other | Admitting: Physical Therapy

## 2012-07-11 ENCOUNTER — Ambulatory Visit: Payer: Medicare Other | Admitting: Occupational Therapy

## 2012-07-15 ENCOUNTER — Ambulatory Visit: Payer: Medicare Other | Admitting: Physical Therapy

## 2012-07-15 ENCOUNTER — Ambulatory Visit: Payer: Medicare Other | Admitting: Occupational Therapy

## 2012-07-17 ENCOUNTER — Ambulatory Visit: Payer: Medicare Other | Admitting: Physical Therapy

## 2012-07-17 ENCOUNTER — Ambulatory Visit: Payer: Medicare Other | Admitting: Occupational Therapy

## 2012-07-21 ENCOUNTER — Ambulatory Visit: Payer: Medicare Other | Admitting: Physical Therapy

## 2012-07-21 ENCOUNTER — Ambulatory Visit: Payer: Medicare Other | Admitting: Occupational Therapy

## 2012-07-22 ENCOUNTER — Encounter: Payer: Medicare Other | Admitting: *Deleted

## 2012-07-22 ENCOUNTER — Ambulatory Visit: Payer: Medicare Other | Admitting: Physical Therapy

## 2012-07-30 ENCOUNTER — Ambulatory Visit: Payer: Medicare Other | Admitting: *Deleted

## 2012-07-30 ENCOUNTER — Ambulatory Visit: Payer: Medicare Other | Attending: Internal Medicine | Admitting: Physical Therapy

## 2012-07-30 DIAGNOSIS — IMO0001 Reserved for inherently not codable concepts without codable children: Secondary | ICD-10-CM | POA: Insufficient documentation

## 2012-07-30 DIAGNOSIS — R262 Difficulty in walking, not elsewhere classified: Secondary | ICD-10-CM | POA: Insufficient documentation

## 2012-07-30 DIAGNOSIS — R269 Unspecified abnormalities of gait and mobility: Secondary | ICD-10-CM | POA: Insufficient documentation

## 2012-07-30 DIAGNOSIS — R5381 Other malaise: Secondary | ICD-10-CM | POA: Insufficient documentation

## 2012-07-30 DIAGNOSIS — M6281 Muscle weakness (generalized): Secondary | ICD-10-CM | POA: Insufficient documentation

## 2012-08-01 ENCOUNTER — Ambulatory Visit: Payer: Medicare Other | Admitting: Physical Therapy

## 2012-08-01 ENCOUNTER — Ambulatory Visit: Payer: Medicare Other | Admitting: Occupational Therapy

## 2012-10-12 ENCOUNTER — Emergency Department (HOSPITAL_COMMUNITY)
Admission: EM | Admit: 2012-10-12 | Discharge: 2012-10-12 | Disposition: A | Discharge status: Home / Self Care | Payer: Medicare Other | Attending: Emergency Medicine | Admitting: Emergency Medicine

## 2012-10-12 ENCOUNTER — Encounter (HOSPITAL_COMMUNITY): Payer: Self-pay | Admitting: *Deleted

## 2012-10-12 DIAGNOSIS — K089 Disorder of teeth and supporting structures, unspecified: Secondary | ICD-10-CM | POA: Insufficient documentation

## 2012-10-12 DIAGNOSIS — R220 Localized swelling, mass and lump, head: Secondary | ICD-10-CM | POA: Insufficient documentation

## 2012-10-12 DIAGNOSIS — IMO0002 Reserved for concepts with insufficient information to code with codable children: Secondary | ICD-10-CM | POA: Insufficient documentation

## 2012-10-12 DIAGNOSIS — R000 Tachycardia, unspecified: Secondary | ICD-10-CM | POA: Insufficient documentation

## 2012-10-12 DIAGNOSIS — K047 Periapical abscess without sinus: Secondary | ICD-10-CM

## 2012-10-12 DIAGNOSIS — K044 Acute apical periodontitis of pulpal origin: Secondary | ICD-10-CM | POA: Insufficient documentation

## 2012-10-12 DIAGNOSIS — K029 Dental caries, unspecified: Secondary | ICD-10-CM | POA: Insufficient documentation

## 2012-10-12 DIAGNOSIS — M3320 Polymyositis, organ involvement unspecified: Secondary | ICD-10-CM | POA: Insufficient documentation

## 2012-10-12 DIAGNOSIS — Z79899 Other long term (current) drug therapy: Secondary | ICD-10-CM | POA: Insufficient documentation

## 2012-10-12 DIAGNOSIS — I10 Essential (primary) hypertension: Secondary | ICD-10-CM | POA: Insufficient documentation

## 2012-10-12 DIAGNOSIS — H9209 Otalgia, unspecified ear: Secondary | ICD-10-CM | POA: Insufficient documentation

## 2012-10-12 DIAGNOSIS — R110 Nausea: Secondary | ICD-10-CM | POA: Insufficient documentation

## 2012-10-12 MED ORDER — PENICILLIN V POTASSIUM 500 MG PO TABS: 500.0000 mg | ORAL_TABLET | Freq: Four times a day (QID) | ORAL | Status: DC

## 2012-10-12 MED ORDER — BUPIVACAINE-EPINEPHRINE PF 0.5-1:200000 % IJ SOLN
1.8000 mL | Freq: Once | INTRAMUSCULAR | Status: DC
  Filled 2012-10-12: qty 1.8

## 2012-10-12 MED ORDER — HYDROCODONE-ACETAMINOPHEN 5-325 MG PO TABS: 2.0000 | ORAL_TABLET | ORAL | Status: DC | PRN

## 2012-10-12 MED ORDER — ONDANSETRON 8 MG PO TBDP
8.0000 mg | ORAL_TABLET | Freq: Once | ORAL | Status: AC
  Administered 2012-10-12: 8 mg via ORAL
  Filled 2012-10-12: qty 1

## 2012-10-12 NOTE — ED Provider Notes (Signed)
History    This chart was scribed for non-physician practitioner working with Leonette Most B. Bernette Mayers, MD by Donne Anon, ED Scribe. This patient was seen in room WTR9/WTR9 and the patient's care was started at 1928.   CSN: [id number]  Arrival date & time 10/12/12  1709   First MD Initiated Contact with Patient 10/12/12 1928      Chief Complaint  Patient presents with  . Dental Pain     The history is provided by the patient and medical records. No language interpreter was used.   Jessica Burke is a 52 y.o. female who presents to the Emergency Department complaining of gradual onset, gradually worsening, non radiating, moderate right sided dental pain which began yesterday and is described as throbbing (8/10). She reports her tooth has been chipping off over the past few days. She reports associated facial swelling (began last night), nausea and right ear pain. She also reports that she hasn't eaten since last night due to the pain. She denies fever, chills, or any other pain. She has tried Tylenol with mild relief.     Past Medical History  Diagnosis Date  . Hypertension   . Polymyositis     Past Surgical History  Procedure Laterality Date  . Endometrial ablation    . Cesarean section    . Leep      Family History  Problem Relation Age of Onset  . Hypertension Father     History  Substance Use Topics  . Smoking status: Never Smoker   . Smokeless tobacco: Not on file  . Alcohol Use: No    OB History   Grav Para Term Preterm Abortions TAB SAB Ect Mult Living   1 1              Review of Systems  Constitutional: Negative for fever, diaphoresis, appetite change, fatigue and unexpected weight change.  HENT: Positive for ear pain, facial swelling and dental problem. Negative for mouth sores and neck stiffness.   Eyes: Negative for visual disturbance.  Respiratory: Negative for cough, chest tightness, shortness of breath and wheezing.   Cardiovascular: Negative for  chest pain.  Gastrointestinal: Positive for nausea. Negative for vomiting, abdominal pain, diarrhea and constipation.  Endocrine: Negative for polydipsia, polyphagia and polyuria.  Genitourinary: Negative for dysuria, urgency, frequency and hematuria.  Musculoskeletal: Negative for back pain.  Skin: Negative for rash.  Allergic/Immunologic: Negative for immunocompromised state.  Neurological: Negative for syncope, light-headedness and headaches.  Hematological: Does not bruise/bleed easily.  Psychiatric/Behavioral: Negative for sleep disturbance. The patient is not nervous/anxious.   All other systems reviewed and are negative.    Allergies  Review of patient's allergies indicates no known allergies.  Home Medications   Current Outpatient Rx  Name  Route  Sig  Dispense  Refill  . azaTHIOprine (IMURAN) 50 MG tablet   Oral   Take 200 mg by mouth daily.          . Calcium-Vitamin D-Vitamin K (VIACTIV PO)   Oral   Take 2 Units by mouth daily.         . predniSONE (DELTASONE) 5 MG tablet   Oral   Take 15 mg by mouth daily.         . ranitidine (ZANTAC) 150 MG tablet   Oral   Take 150 mg by mouth 2 (two) times daily as needed.         Marland Kitchen telmisartan-hydrochlorothiazide (MICARDIS HCT) 80-12.5 MG per tablet   Oral  Take 1 tablet by mouth daily.         Marland Kitchen alendronate (FOSAMAX) 70 MG tablet   Oral   Take 70 mg by mouth every 7 (seven) days. Take with a full glass of water on an empty stomach.         . ergocalciferol (VITAMIN D2) 50000 UNITS capsule   Oral   Take 50,000 Units by mouth once a week.         Marland Kitchen HYDROcodone-acetaminophen (NORCO/VICODIN) 5-325 MG per tablet   Oral   Take 2 tablets by mouth every 4 (four) hours as needed for pain.   15 tablet   0   . penicillin v potassium (VEETID) 500 MG tablet   Oral   Take 1 tablet (500 mg total) by mouth 4 (four) times daily.   40 tablet   0   . tetrahydrozoline-zinc (VISINE-AC) 0.05-0.25 % ophthalmic  solution   Both Eyes   Place 2 drops into both eyes 3 (three) times daily as needed.           Triage Vitals; BP 162/113  Pulse 115  Temp(Src) 98.5 F (36.9 C) (Oral)  Resp 18  SpO2 100%  Physical Exam  Nursing note and vitals reviewed. Constitutional: She appears well-developed and well-nourished. No distress.  HENT:  Head: Normocephalic and atraumatic.    Right Ear: Tympanic membrane, external ear and ear canal normal. Tympanic membrane is not erythematous and not bulging. No middle ear effusion.  Left Ear: Tympanic membrane, external ear and ear canal normal. Tympanic membrane is not erythematous and not bulging.  No middle ear effusion.  Nose: Nose normal. No mucosal edema or rhinorrhea.  Mouth/Throat: Uvula is midline, oropharynx is clear and moist and mucous membranes are normal. Mucous membranes are not dry and not cyanotic. Abnormal dentition. Dental caries present. No oropharyngeal exudate, posterior oropharyngeal edema, posterior oropharyngeal erythema or tonsillar abscesses.    Swelling to right side of face and jaw. Swelling and erythema to the gums surrounding a dental carry and broken tooth. No edema of the floor of the mouth. No gross abscess.  Eyes: Conjunctivae and EOM are normal. Pupils are equal, round, and reactive to light. No scleral icterus.  Neck: Normal range of motion. Neck supple.  Cardiovascular: Regular rhythm, normal heart sounds and intact distal pulses.  Tachycardia present.  Exam reveals no gallop and no friction rub.   No murmur heard. Pulmonary/Chest: Effort normal and breath sounds normal. No respiratory distress. She has no wheezes. She has no rales. She exhibits no tenderness.  Abdominal: Soft. Bowel sounds are normal. She exhibits no distension and no mass. There is no tenderness. There is no rebound and no guarding.  Musculoskeletal: Normal range of motion. She exhibits no edema.  Lymphadenopathy:       Head (right side): Tonsillar  adenopathy present. No submental, no submandibular, no preauricular, no posterior auricular and no occipital adenopathy present.       Head (left side): Tonsillar adenopathy present. No submental, no submandibular, no preauricular, no posterior auricular and no occipital adenopathy present.    She has no cervical adenopathy.  Tonsillar tenderness bilaterally.  Neurological: She is alert.  Speech is clear and goal oriented Moves extremities without ataxia  Skin: Skin is warm and dry. She is not diaphoretic. There is erythema (L lower jaw).  Psychiatric: She has a normal mood and affect.    ED Course  Dental Date/Time: 10/12/2012 9:11 PM Performed by: Dierdre Forth Authorized by: Mardene Sayer,  Suleima Ohlendorf Consent: Verbal consent obtained. Risks and benefits: risks, benefits and alternatives were discussed Consent given by: patient Patient understanding: patient states understanding of the procedure being performed Patient consent: the patient's understanding of the procedure matches consent given Procedure consent: procedure consent matches procedure scheduled Relevant documents: relevant documents present and verified Site marked: the operative site was marked Required items: required blood products, implants, devices, and special equipment available Patient identity confirmed: verbally with patient Time out: Immediately prior to procedure a "time out" was called to verify the correct patient, procedure, equipment, support staff and site/side marked as required. Preparation: Patient was prepped and draped in the usual sterile fashion. Local anesthesia used: yes Local anesthetic: bupivacaine 0.5% with epinephrine Patient sedated: no Patient tolerance: Patient tolerated the procedure well with no immediate complications. Comments: Dental block of tooth #29 without complication   (including critical care time) DIAGNOSTIC STUDIES: Oxygen Saturation is 100% on room air, normal by my  interpretation.    COORDINATION OF CARE: 8:00 PM Discussed treatment plan which includes dental block with pt at bedside and pt agreed to plan.     Labs Reviewed - No data to display No results found.   1. Pain due to dental caries   2. Dental infection       MDM  Ronny Flurry presents with dental pain.  Patient with toothache.  No gross abscess.  Exam unconcerning for Ludwig's angina or spread of infection.  Will treat with penicillin and pain medicine.  Urged patient to follow-up with dentist.    1. Medications: vicodin, penicillin, usual home medications 2. Treatment: rest, drink plenty of fluids, take medications as prescribed 3. Follow Up: Please followup with your primary doctor for discussion of your diagnoses and further evaluation after today's visit; if you do not have a primary care doctor use the resource guide provided to find one; f/u with dentistry as discussed  I personally performed the services described in this documentation, which was scribed in my presence. The recorded information has been reviewed and is accurate.         Dahlia Client Romaine Maciolek, PA-C 10/12/12 2112

## 2012-10-12 NOTE — ED Notes (Signed)
Pt in c/o right sided toothache since yesterday, swelling noted

## 2012-10-12 NOTE — ED Provider Notes (Signed)
Medical screening examination/treatment/procedure(s) were performed by non-physician practitioner and as supervising physician I was immediately available for consultation/collaboration.  Wesson Stith B. Kendra Grissett, MD 10/12/12 2332 

## 2013-03-17 ENCOUNTER — Observation Stay (HOSPITAL_COMMUNITY)
Admission: EM | Admit: 2013-03-17 | Discharge: 2013-03-18 | Disposition: A | Discharge status: Home / Self Care | Payer: Medicare Other | Attending: Internal Medicine | Admitting: Internal Medicine

## 2013-03-17 ENCOUNTER — Emergency Department (HOSPITAL_COMMUNITY): Payer: Medicare Other

## 2013-03-17 ENCOUNTER — Encounter (HOSPITAL_COMMUNITY): Payer: Self-pay | Admitting: Emergency Medicine

## 2013-03-17 DIAGNOSIS — IMO0002 Reserved for concepts with insufficient information to code with codable children: Secondary | ICD-10-CM | POA: Insufficient documentation

## 2013-03-17 DIAGNOSIS — I10 Essential (primary) hypertension: Secondary | ICD-10-CM | POA: Diagnosis present

## 2013-03-17 DIAGNOSIS — R079 Chest pain, unspecified: Principal | ICD-10-CM | POA: Diagnosis present

## 2013-03-17 DIAGNOSIS — R0602 Shortness of breath: Secondary | ICD-10-CM | POA: Insufficient documentation

## 2013-03-17 DIAGNOSIS — D649 Anemia, unspecified: Secondary | ICD-10-CM | POA: Diagnosis present

## 2013-03-17 DIAGNOSIS — E876 Hypokalemia: Secondary | ICD-10-CM | POA: Diagnosis present

## 2013-03-17 DIAGNOSIS — Z79899 Other long term (current) drug therapy: Secondary | ICD-10-CM | POA: Insufficient documentation

## 2013-03-17 DIAGNOSIS — D72829 Elevated white blood cell count, unspecified: Secondary | ICD-10-CM | POA: Diagnosis present

## 2013-03-17 LAB — COMPREHENSIVE METABOLIC PANEL
ALT: 12 U/L (ref 0–35)
AST: 15 U/L (ref 0–37)
Albumin: 3.5 g/dL (ref 3.5–5.2)
Alkaline Phosphatase: 63 U/L (ref 39–117)
BUN: 12 mg/dL (ref 6–23)
CO2: 27 mEq/L (ref 19–32)
Calcium: 9.5 mg/dL (ref 8.4–10.5)
Chloride: 98 mEq/L (ref 96–112)
Creatinine, Ser: 0.5 mg/dL (ref 0.50–1.10)
GFR calc Af Amer: >90 mL/min (ref >90)
GFR calc non Af Amer: >90 mL/min (ref >90)
Glucose, Bld: 108 mg/dL — ABNORMAL HIGH (ref 70–99)
Potassium: 3.2 mEq/L — ABNORMAL LOW (ref 3.5–5.1)
Sodium: 137 mEq/L (ref 135–145)
Total Bilirubin: 0.2 mg/dL — ABNORMAL LOW (ref 0.3–1.2)
Total Protein: 7.3 g/dL (ref 6.0–8.3)

## 2013-03-17 LAB — CBC WITH DIFFERENTIAL/PLATELET
Basophils Absolute: 0 10*3/uL (ref 0.0–0.1)
Basophils Relative: 0 % (ref 0–1)
Eosinophils Absolute: 0.1 10*3/uL (ref 0.0–0.7)
Eosinophils Relative: 1 % (ref 0–5)
HCT: 36.3 % (ref 36.0–46.0)
Hemoglobin: 11.1 g/dL — ABNORMAL LOW (ref 12.0–15.0)
Lymphocytes Relative: 18 % (ref 12–46)
Lymphs Abs: 2.1 10*3/uL (ref 0.7–4.0)
MCH: 26.3 pg (ref 26.0–34.0)
MCHC: 30.6 g/dL (ref 30.0–36.0)
MCV: 86 fL (ref 78.0–100.0)
Monocytes Absolute: 0.9 10*3/uL (ref 0.1–1.0)
Monocytes Relative: 8 % (ref 3–12)
Neutro Abs: 8.5 10*3/uL — ABNORMAL HIGH (ref 1.7–7.7)
Neutrophils Relative %: 73 % (ref 43–77)
Platelets: 296 10*3/uL (ref 150–400)
RBC: 4.22 MIL/uL (ref 3.87–5.11)
RDW: 17.6 % — ABNORMAL HIGH (ref 11.5–15.5)
WBC: 11.7 10*3/uL — ABNORMAL HIGH (ref 4.0–10.5)

## 2013-03-17 LAB — TROPONIN I
Troponin I: <0.3 ng/mL (ref <0.30)
Troponin I: <0.3 ng/mL (ref <0.30)

## 2013-03-17 LAB — LIPID PANEL
Cholesterol: 179 mg/dL (ref 0–200)
HDL: 44 mg/dL (ref >39)
LDL Cholesterol: 105 mg/dL — ABNORMAL HIGH (ref 0–99)
Total CHOL/HDL Ratio: 4.1 RATIO
Triglycerides: 151 mg/dL — ABNORMAL HIGH (ref <150)
VLDL: 30 mg/dL (ref 0–40)

## 2013-03-17 LAB — PRO B NATRIURETIC PEPTIDE: Pro B Natriuretic peptide (BNP): 11.9 pg/mL (ref 0–125)

## 2013-03-17 MED ORDER — NAPHAZOLINE HCL 0.1 % OP SOLN
1.0000 [drp] | Freq: Four times a day (QID) | OPHTHALMIC | Status: DC | PRN
  Filled 2013-03-17: qty 15

## 2013-03-17 MED ORDER — ONDANSETRON HCL 4 MG PO TABS: 4.0000 mg | ORAL_TABLET | Freq: Four times a day (QID) | ORAL | Status: DC | PRN

## 2013-03-17 MED ORDER — ERGOCALCIFEROL 1.25 MG (50000 UT) PO CAPS: 50000.0000 [IU] | ORAL_CAPSULE | ORAL | Status: DC

## 2013-03-17 MED ORDER — ALENDRONATE SODIUM 70 MG PO TABS: 70.0000 mg | ORAL_TABLET | ORAL | Status: DC

## 2013-03-17 MED ORDER — MORPHINE SULFATE 2 MG/ML IJ SOLN: 1.0000 mg | INTRAMUSCULAR | Status: DC | PRN

## 2013-03-17 MED ORDER — SODIUM CHLORIDE 0.9 % IV SOLN
INTRAVENOUS | Status: AC
  Administered 2013-03-17: 21:00:00 via INTRAVENOUS

## 2013-03-17 MED ORDER — POTASSIUM CHLORIDE CRYS ER 20 MEQ PO TBCR
40.0000 meq | EXTENDED_RELEASE_TABLET | Freq: Once | ORAL | Status: AC
  Administered 2013-03-17: 40 meq via ORAL
  Filled 2013-03-17: qty 2

## 2013-03-17 MED ORDER — ASPIRIN 81 MG PO CHEW
324.0000 mg | CHEWABLE_TABLET | Freq: Once | ORAL | Status: AC
  Administered 2013-03-17: 324 mg via ORAL
  Filled 2013-03-17: qty 4

## 2013-03-17 MED ORDER — ACETAMINOPHEN 325 MG PO TABS: 650.0000 mg | ORAL_TABLET | Freq: Four times a day (QID) | ORAL | Status: DC | PRN

## 2013-03-17 MED ORDER — ACETAMINOPHEN 650 MG RE SUPP: 650.0000 mg | Freq: Four times a day (QID) | RECTAL | Status: DC | PRN

## 2013-03-17 MED ORDER — AZATHIOPRINE 50 MG PO TABS
200.0000 mg | ORAL_TABLET | Freq: Every day | ORAL | Status: DC
  Administered 2013-03-17 – 2013-03-18 (×2): 200 mg via ORAL
  Filled 2013-03-17 (×3): qty 4

## 2013-03-17 MED ORDER — ONDANSETRON HCL 4 MG/2ML IJ SOLN: 4.0000 mg | Freq: Four times a day (QID) | INTRAMUSCULAR | Status: DC | PRN

## 2013-03-17 MED ORDER — HYDROCODONE-ACETAMINOPHEN 5-325 MG PO TABS: 1.0000 | ORAL_TABLET | ORAL | Status: DC | PRN

## 2013-03-17 MED ORDER — NITROGLYCERIN 0.4 MG SL SUBL
0.4000 mg | SUBLINGUAL_TABLET | SUBLINGUAL | Status: DC | PRN
  Filled 2013-03-17: qty 25

## 2013-03-17 MED ORDER — VITAMIN D (ERGOCALCIFEROL) 1.25 MG (50000 UNIT) PO CAPS
50000.0000 [IU] | ORAL_CAPSULE | ORAL | Status: DC
  Administered 2013-03-17: 50000 [IU] via ORAL
  Filled 2013-03-17 (×2): qty 1

## 2013-03-17 MED ORDER — SODIUM CHLORIDE 0.9 % IJ SOLN
3.0000 mL | Freq: Two times a day (BID) | INTRAMUSCULAR | Status: DC
  Administered 2013-03-17 – 2013-03-18 (×2): 3 mL via INTRAVENOUS

## 2013-03-17 MED ORDER — FAMOTIDINE 20 MG PO TABS
20.0000 mg | ORAL_TABLET | Freq: Every day | ORAL | Status: DC
  Administered 2013-03-18: 20 mg via ORAL
  Filled 2013-03-17: qty 1

## 2013-03-17 MED ORDER — PREDNISONE 5 MG PO TABS
15.0000 mg | ORAL_TABLET | Freq: Every day | ORAL | Status: DC
  Administered 2013-03-17 – 2013-03-18 (×2): 15 mg via ORAL
  Filled 2013-03-17 (×3): qty 1

## 2013-03-17 NOTE — Progress Notes (Signed)
PHARMACIST - PHYSICIAN COMMUNICATION  CONCERNING: P&T Medication Policy Regarding Oral Bisphosphonates  RECOMMENDATION: Your order for alendronate (Fosamax), ibandronate (Boniva), or risedronate (Actonel) has been discontinued at this time.  If the patient's post-hospital medical condition warrants safe use of this class of drugs, please resume the pre-hospital regimen upon discharge.  DESCRIPTION:  Alendronate (Fosamax), ibandronate (Boniva), and risedronate (Actonel) can cause severe esophageal erosions in patients who are unable to remain upright at least 30 minutes after taking this medication.   Since brief interruptions in therapy are thought to have minimal impact on bone mineral density, the Pharmacy & Therapeutics Committee has established that bisphosphonate orders should be routinely discontinued during hospitalization.   To override this safety policy and permit administration of Boniva, Fosamax, or Actonel in the hospital, prescribers must write "DO NOT HOLD" in the comments section when placing the order for this class of medications.  Darrol Angel, PharmD Pager: [phone] 03/17/2013 8:47 PM

## 2013-03-17 NOTE — ED Provider Notes (Signed)
History    CSN: [id number] Arrival date & time 03/17/13  1647  First MD Initiated Contact with Patient 03/17/13 1651     No chief complaint on file.  (Consider location/radiation/quality/duration/timing/severity/associated sxs/prior Treatment) HPI Comments: Comes to the ER for evaluation of chest discomfort. Patient reports that she has been having intermittent pain on the left side of her neck and jaw for approximately a week. 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 Today, however, she started to notice heaviness and pressure in the left eye her chest in conjunction with these symptoms. She has slight shortness of breath. No nausea or diaphoresis.  Patient reports that her father died at age 66 from IHSS.  Past Medical History  Diagnosis Date  . Hypertension   . Polymyositis    Past Surgical History  Procedure Laterality Date  . Endometrial ablation    . Cesarean section    . Leep     Family History  Problem Relation Age of Onset  . Hypertension Father    History  Substance Use Topics  . Smoking status: Never Smoker   . Smokeless tobacco: Not on file  . Alcohol Use: No   OB History   Grav Para Term Preterm Abortions TAB SAB Ect Mult Living   1 1             Review of Systems  Constitutional: Negative for fever.  Respiratory: Positive for shortness of breath.   Cardiovascular: Positive for chest pain.  All other systems reviewed and are negative.    Allergies  Review of patient's allergies indicates no known allergies.  Home Medications   Current Outpatient Rx  Name  Route  Sig  Dispense  Refill  . alendronate (FOSAMAX) 70 MG tablet   Oral   Take 70 mg by mouth every 7 (seven) days. Take with a full glass of water on an empty stomach.         Marland Kitchen azaTHIOprine (IMURAN) 50 MG tablet   Oral   Take 200 mg by mouth daily.          . Calcium-Vitamin D-Vitamin K (VIACTIV PO)   Oral   Take 2 Units by mouth daily.         .  ergocalciferol (VITAMIN D2) 50000 UNITS capsule   Oral   Take 50,000 Units by mouth once a week.         Marland Kitchen HYDROcodone-acetaminophen (NORCO/VICODIN) 5-325 MG per tablet   Oral   Take 2 tablets by mouth every 4 (four) hours as needed for pain.   15 tablet   0   . penicillin v potassium (VEETID) 500 MG tablet   Oral   Take 1 tablet (500 mg total) by mouth 4 (four) times daily.   40 tablet   0   . predniSONE (DELTASONE) 5 MG tablet   Oral   Take 15 mg by mouth daily.         . ranitidine (ZANTAC) 150 MG tablet   Oral   Take 150 mg by mouth 2 (two) times daily as needed.         Marland Kitchen telmisartan-hydrochlorothiazide (MICARDIS HCT) 80-12.5 MG per tablet   Oral   Take 1 tablet by mouth daily.         Marland Kitchen tetrahydrozoline-zinc (VISINE-AC) 0.05-0.25 % ophthalmic solution   Both Eyes   Place 2 drops into both eyes 3 (three) times daily as needed.  There were no vitals taken for this visit. Physical Exam  Constitutional: She is oriented to person, place, and time. She appears well-developed and well-nourished. No distress.  HENT:  Head: Normocephalic and atraumatic.  Right Ear: Hearing normal.  Left Ear: Hearing normal.  Nose: Nose normal.  Mouth/Throat: Oropharynx is clear and moist and mucous membranes are normal.  Eyes: Conjunctivae and EOM are normal. Pupils are equal, round, and reactive to light.  Neck: Normal range of motion. Neck supple.  Cardiovascular: Regular rhythm, S1 normal and S2 normal.  Exam reveals no gallop and no friction rub.   No murmur heard. Pulmonary/Chest: Effort normal and breath sounds normal. No respiratory distress. She exhibits no tenderness.  Abdominal: Soft. Normal appearance and bowel sounds are normal. There is no hepatosplenomegaly. There is no tenderness. There is no rebound, no guarding, no tenderness at McBurney's point and negative Murphy's sign. No hernia.  Musculoskeletal: Normal range of motion.  Neurological: She is alert  and oriented to person, place, and time. She has normal strength. No cranial nerve deficit or sensory deficit. Coordination normal. GCS eye subscore is 4. GCS verbal subscore is 5. GCS motor subscore is 6.  Skin: Skin is warm, dry and intact. No rash noted. No cyanosis.  Psychiatric: She has a normal mood and affect. Her speech is normal and behavior is normal. Thought content normal.    ED Course  Procedures (including critical care time)  EKG:  Date: 03/17/2013  Rate: 99  Rhythm: normal sinus rhythm  QRS Axis: left  Intervals: normal  ST/T Wave abnormalities: nonspecific ST/T changes  Conduction Disutrbances:none  Narrative Interpretation:   Old EKG Reviewed: unchanged   Labs Reviewed  CBC WITH DIFFERENTIAL - Abnormal; Notable for the following:    WBC 11.7 (*)    Hemoglobin 11.1 (*)    RDW 17.6 (*)    Neutro Abs 8.5 (*)    All other components within normal limits  COMPREHENSIVE METABOLIC PANEL - Abnormal; Notable for the following:    Potassium 3.2 (*)    Glucose, Bld 108 (*)    Total Bilirubin 0.2 (*)    All other components within normal limits  PRO B NATRIURETIC PEPTIDE  TROPONIN I   Dg Chest Port 1 View  03/17/2013   *RADIOLOGY REPORT*  Clinical Data: Chest pain, nausea and shortness of breath.  PORTABLE CHEST - 1 VIEW  Comparison: 10/18/2011  Findings: Lung volumes are very low bilaterally.  No edema, pulmonary consolidation or pleural fluid is identified.  Heart size is stable and at the upper limits of normal.  IMPRESSION: Low lung volumes.  No active disease.   Original Report Authenticated By: Irish Lack, M.D.   Diagnoses: Chest pain   MDM  Patient presents to the ER with complete chest pain. Patient has been having pain in the left side of her neck and jaw for several days, today she has had these symptoms in conjunction with a left sided chest heaviness. Patient's EKG shows LVH but no acute changes. Troponin was negative. Based on her cardiac risk factors,  will be placed in hospital for observation.  Gilda Crease, MD 03/17/13 [phone]

## 2013-03-17 NOTE — H&P (Signed)
[facility] History and Physical  Jessica Burke ZOX:[id number] DOB: 09-29-1960 DOA: 03/17/2013  Referring physician: ER physician PCP: No primary provider on file.   Chief Complaint: chest pain  HPI:  52 year old female with past medical history of hypertension and polymyositis who presented to [facility] ED with left sided chest pain started on the day of this admission radiating to the left jaw, 8/10 in intensity and started at rest. Pain is sharp and constant and was somewhat relieved with nitroglycerin given in ED. Patient also reported associated nausea but no vomiting or sweating. No shortness of breath and no palpitations. No reports of fever or chills or cough. No orthopnea. No reports of blood in stool or urine. No diarrhea or constipation. In ED, vital are stable with BP of 90/62 which recovered to 109/96 with IV fluids. HR was 111. CBC revealed mild leukocytosis of 11.7 and hemoglobin of 11.1 BMP revealed potassium of 3.2 which was repleted with potassium chloride 40 meq PO ine dose given in ED.Marland Kitchen CXR showed low lung volumes.   Assessment and Plan:  Principal Problem:   Chest pain - rule out ACS - cycle cardiac enzymes for total of 3 sets - obtain 2 D ECHO, 12 lead EKG, lipid panel, TSH - analgesia with morphine 1 mg Q 2 hours IV PRN severe pain - nitroglycerin PRN chest pain - continue IV fluids for next 12 hours and then reassess if still needed to give IV fluids  Active Problems:   Hypertension - Micardis on hold due to BP on soft side   Leukocytosis - likely reactive - no obvious source of infection   Anemia - hemoglobin stable at 11.1 - no indications for transfusion   Hypokalemia - repleted in ED with potassium chloride 40 meq one dose PO  Code Status: Full Family Communication: Pt at bedside Disposition Plan: Admit for further evaluation  Manson Passey, MD  [facility] Pager [phone]  If 7PM-7AM, please contact night-coverage [email or web address] Password  [facility] 03/17/2013, 7:43 PM   Review of Systems:  Constitutional: Negative for fever, chills and malaise/fatigue. Negative for diaphoresis.  HENT: Negative for hearing loss, ear pain, nosebleeds, congestion, sore throat, neck pain, tinnitus and ear discharge.   Eyes: Negative for blurred vision, double vision, photophobia, pain, discharge and redness.  Respiratory: Negative for cough, hemoptysis, sputum production, shortness of breath, wheezing and stridor.   Cardiovascular: positive for chest pain, no palpitations, orthopnea, claudication and leg swelling.  Gastrointestinal: positive for nausea, no vomiting and abdominal pain. Negative for heartburn, constipation, blood in stool and melena.  Genitourinary: Negative for dysuria, urgency, frequency, hematuria and flank pain.  Musculoskeletal: Negative for myalgias, back pain, joint pain and falls.  Skin: Negative for itching and rash.  Neurological: Negative for dizziness and weakness. Negative for tingling, tremors, sensory change, speech change, focal weakness, loss of consciousness and headaches.  Endo/Heme/Allergies: Negative for environmental allergies and polydipsia. Does not bruise/bleed easily.  Psychiatric/Behavioral: Negative for suicidal ideas. The patient is not nervous/anxious.      Past Medical History  Diagnosis Date  . Hypertension   . Polymyositis    Past Surgical History  Procedure Laterality Date  . Endometrial ablation    . Cesarean section    . Leep     Social History:  reports that she has never smoked. She does not have any smokeless tobacco history on file. She reports that she does not drink alcohol or use illicit drugs.  No Known Allergies    Family  History  Problem Relation Age of Onset  . Hypertension Father      Prior to Admission medications   Medication Sig Start Date End Date Taking? Authorizing Provider  alendronate (FOSAMAX) 70 MG tablet Take 70 mg by mouth every 7 (seven) days. Take with a full  glass of water on an empty stomach.   Yes Historical Provider, MD  azaTHIOprine (IMURAN) 50 MG tablet Take 200 mg by mouth daily.    Yes Historical Provider, MD  Calcium-Vitamin D-Vitamin K (VIACTIV PO) Take 2 Units by mouth daily.   Yes Historical Provider, MD  ergocalciferol (VITAMIN D2) 50000 UNITS capsule Take 50,000 Units by mouth once a week.   Yes Historical Provider, MD  predniSONE (DELTASONE) 5 MG tablet Take 15 mg by mouth daily.   Yes Historical Provider, MD  ranitidine (ZANTAC) 150 MG tablet Take 150 mg by mouth 2 (two) times daily as needed.   Yes Historical Provider, MD  telmisartan-hydrochlorothiazide (MICARDIS HCT) 80-12.5 MG per tablet Take 1 tablet by mouth daily.   Yes Historical Provider, MD  tetrahydrozoline-zinc (VISINE-AC) 0.05-0.25 % ophthalmic solution Place 2 drops into both eyes 3 (three) times daily as needed.   Yes Historical Provider, MD   Physical Exam: Filed Vitals:   03/17/13 1656 03/17/13 1730 03/17/13 1830 03/17/13 1900  BP: 109/96 90/62 91/50  94/52  Pulse: 111     Temp: 97.8 F (36.6 C)     TempSrc: Oral     Resp: 17 19 13 18   SpO2: 100%       Physical Exam  Constitutional: Appears well-developed and well-nourished. No distress.  HENT: Normocephalic. External right and left ear normal. Oropharynx is clear and moist.  Eyes: Conjunctivae and EOM are normal. PERRLA, no scleral icterus.  Neck: Normal ROM. Neck supple. No JVD. No tracheal deviation. No thyromegaly.  CVS: RRR, S1/S2 +, no murmurs, no gallops, no carotid bruit.  Pulmonary: Effort and breath sounds normal, no stridor, rhonchi, wheezes, rales.  Abdominal: Soft. BS +,  no distension, tenderness, rebound or guarding.  Musculoskeletal: Normal range of motion. No edema and no tenderness.  Lymphadenopathy: No lymphadenopathy noted, cervical, inguinal. Neuro: Alert. Normal reflexes, muscle tone coordination. No cranial nerve deficit. Skin: Skin is warm and dry. No rash noted. Not diaphoretic. No  erythema. No pallor.  Psychiatric: Normal mood and affect. Behavior, judgment, thought content normal.   Labs on Admission:  Basic Metabolic Panel:  Recent Labs Lab 03/17/13 1720  NA 137  K 3.2*  CL 98  CO2 27  GLUCOSE 108*  BUN 12  CREATININE 0.50  CALCIUM 9.5   Liver Function Tests:  Recent Labs Lab 03/17/13 1720  AST 15  ALT 12  ALKPHOS 63  BILITOT 0.2*  PROT 7.3  ALBUMIN 3.5   No results found for this basename: LIPASE, AMYLASE,  in the last 168 hours No results found for this basename: AMMONIA,  in the last 168 hours CBC:  Recent Labs Lab 03/17/13 1720  WBC 11.7*  NEUTROABS 8.5*  HGB 11.1*  HCT 36.3  MCV 86.0  PLT 296   Cardiac Enzymes:  Recent Labs Lab 03/17/13 1720  TROPONINI <0.30   BNP: No components found with this basename: POCBNP,  CBG: No results found for this basename: GLUCAP,  in the last 168 hours  Radiological Exams on Admission: Dg Chest Port 1 View 03/17/2013   * IMPRESSION: Low lung volumes.  No active disease.       Time spent: 75 minutes

## 2013-03-17 NOTE — ED Notes (Signed)
Patient with chest pressure which started today around 1230.  Rates pressure as a 5/10.  Denies SOB, radiation of pain, vomiting, or dizziness.  She does report feeling a little nauseated.  No prior history of heart problems according to patient.

## 2013-03-18 DIAGNOSIS — I517 Cardiomegaly: Secondary | ICD-10-CM

## 2013-03-18 DIAGNOSIS — I10 Essential (primary) hypertension: Secondary | ICD-10-CM

## 2013-03-18 LAB — CBC
HCT: 34.6 % — ABNORMAL LOW (ref 36.0–46.0)
Hemoglobin: 10.5 g/dL — ABNORMAL LOW (ref 12.0–15.0)
MCH: 26.1 pg (ref 26.0–34.0)
MCHC: 30.3 g/dL (ref 30.0–36.0)
MCV: 86.1 fL (ref 78.0–100.0)
Platelets: 272 10*3/uL (ref 150–400)
RBC: 4.02 MIL/uL (ref 3.87–5.11)
RDW: 17.6 % — ABNORMAL HIGH (ref 11.5–15.5)
WBC: 11 10*3/uL — ABNORMAL HIGH (ref 4.0–10.5)

## 2013-03-18 LAB — COMPREHENSIVE METABOLIC PANEL
ALT: 11 U/L (ref 0–35)
AST: 14 U/L (ref 0–37)
Albumin: 3 g/dL — ABNORMAL LOW (ref 3.5–5.2)
Alkaline Phosphatase: 57 U/L (ref 39–117)
BUN: 13 mg/dL (ref 6–23)
CO2: 29 mEq/L (ref 19–32)
Calcium: 9.3 mg/dL (ref 8.4–10.5)
Chloride: 99 mEq/L (ref 96–112)
Creatinine, Ser: 0.43 mg/dL — ABNORMAL LOW (ref 0.50–1.10)
GFR calc Af Amer: >90 mL/min (ref >90)
GFR calc non Af Amer: >90 mL/min (ref >90)
Glucose, Bld: 129 mg/dL — ABNORMAL HIGH (ref 70–99)
Potassium: 3.7 mEq/L (ref 3.5–5.1)
Sodium: 135 mEq/L (ref 135–145)
Total Bilirubin: 0.3 mg/dL (ref 0.3–1.2)
Total Protein: 6.4 g/dL (ref 6.0–8.3)

## 2013-03-18 LAB — GLUCOSE, CAPILLARY: Glucose-Capillary: 117 mg/dL — ABNORMAL HIGH (ref 70–99)

## 2013-03-18 LAB — TROPONIN I: Troponin I: <0.3 ng/mL (ref <0.30)

## 2013-03-18 LAB — TSH: TSH: 0.815 u[IU]/mL (ref 0.350–4.500)

## 2013-03-18 MED ORDER — HYDROCODONE-ACETAMINOPHEN 5-325 MG PO TABS: 1.0000 | ORAL_TABLET | ORAL | Status: DC | PRN

## 2013-03-18 NOTE — Progress Notes (Signed)
  Echocardiogram 2D Echocardiogram has been performed.  Quamir Willemsen 03/18/2013, 9:28 AM 

## 2013-03-18 NOTE — Progress Notes (Signed)
Discharge instructions explained with teach back and prescriptions given. Patient verbalizes understanding of same, stable on discharge.

## 2013-03-18 NOTE — Discharge Summary (Signed)
Physician Discharge Summary  Jessica Burke JXB:[id number] DOB: Dec 12, 1960 DOA: 03/17/2013  PCP: No primary provider on file.  Admit date: 03/17/2013 Discharge date: 03/18/2013  Recommendations for Outpatient Follow-up:  1. Pt will need to follow up with PCP in 2-3 weeks post discharge 2. Please obtain BMP to evaluate electrolytes and kidney function 3. Please also check CBC to evaluate Hg and Hct levels 4. Pt advised to follow up with [facility] cardiology to determine need for stress test in an outpatient setting  5. [facility] called and they will also attempt to call pt to schedule an appointment   Discharge Diagnoses: Chest pain  Principal Problem:   Chest pain Active Problems:   Leukocytosis   Anemia   Hypokalemia   HTN (hypertension)  Discharge Condition: Stable  Diet recommendation: Heart healthy diet discussed in details   History of present illness:  52 year old female with past medical history of hypertension and polymyositis who presented to [facility] ED with left sided chest pain started on the day of this admission radiating to the left jaw, 8/10 in intensity and started at rest. Pain is sharp and constant and was somewhat relieved with nitroglycerin given in ED. Patient also reported associated nausea but no vomiting or sweating. No shortness of breath and no palpitations. No reports of fever or chills or cough. No orthopnea. No reports of blood in stool or urine. No diarrhea or constipation.   In ED, vital are stable with BP of 90/62 which recovered to 109/96 with IV fluids. HR was 111. CBC revealed mild leukocytosis of 11.7 and hemoglobin of 11.1 BMP revealed potassium of 3.2 which was repleted with potassium chloride 40 meq PO ine dose given in ED.Marland Kitchen CXR showed low lung volumes.   Assessment and Plan:  Principal Problem:  Chest pain  - ruled out ACS  - cycled cardiac enzymes for total of 3 sets and all negative - 2 D ECHO unremarkable, 12 lead EKG with no acute  findings, TSH within normal limits   - analgesia with morphine 1 mg Q 2 hours IV PRN severe pain provided and pt has responded well, chest pain resolved  - nitroglycerin PRN chest pain provided inpatient  Active Problems:  Hypertension  - reasonable inpatient control  Leukocytosis  - likely reactive and secondary to Prednisone use  - no obvious source of infection  Anemia  - hemoglobin stable and at pt's baseline  - no indications for transfusion  Hypokalemia  - repleted in ED with potassium chloride 40 meq one dose PO, within normal limits this AM    Procedures/Studies: Dg Chest Port 1 View 03/17/2013  Low lung volumes.  No active disease.  Consultations:  None Antibiotics:  None  Discharge Exam: Filed Vitals:   03/18/13 0512  BP: 105/77  Pulse: 77  Temp: 98 F (36.7 C)  Resp: 28   Filed Vitals:   03/17/13 1900 03/17/13 2051 03/18/13 0218 03/18/13 0512  BP: 94/52 149/97  105/77  Pulse:  103  77  Temp:  97.8 F (36.6 C)  98 F (36.7 C)  TempSrc:  Oral  Oral  Resp: 18 24  28   Height:  5' 3.5" (1.613 m)    Weight:  85.049 kg (187 lb 8 oz) 85.004 kg (187 lb 6.4 oz)   SpO2:  100%  99%    General: Pt is alert, follows commands appropriately, not in acute distress Cardiovascular: Regular rate and rhythm, S1/S2 +, no murmurs, no rubs, no gallops  Respiratory: Clear to auscultation bilaterally, no wheezing, no crackles, no rhonchi Abdominal: Soft, non tender, non distended, bowel sounds +, no guarding Extremities: no edema, no cyanosis, pulses palpable bilaterally DP and PT Neuro: Grossly nonfocal  Discharge Instructions  Discharge Orders   Future Orders Complete By Expires     Diet - low sodium heart healthy  As directed     Increase activity slowly  As directed         Medication List         alendronate 70 MG tablet  Commonly known as:  FOSAMAX  Take 70 mg by mouth every 7 (seven) days. Take with a full glass of water on an empty stomach.      azaTHIOprine 50 MG tablet  Commonly known as:  IMURAN  Take 200 mg by mouth daily.     ergocalciferol 50000 UNITS capsule  Commonly known as:  VITAMIN D2  Take 50,000 Units by mouth once a week.     HYDROcodone-acetaminophen 5-325 MG per tablet  Commonly known as:  NORCO/VICODIN  Take 1-2 tablets by mouth every 4 (four) hours as needed.     predniSONE 5 MG tablet  Commonly known as:  DELTASONE  Take 15 mg by mouth daily.     ranitidine 150 MG tablet  Commonly known as:  ZANTAC  Take 150 mg by mouth 2 (two) times daily as needed.     telmisartan-hydrochlorothiazide 80-12.5 MG per tablet  Commonly known as:  MICARDIS HCT  Take 1 tablet by mouth daily.     tetrahydrozoline-zinc 0.05-0.25 % ophthalmic solution  Commonly known as:  VISINE-AC  Place 2 drops into both eyes 3 (three) times daily as needed.     VIACTIV PO  Take 2 Units by mouth daily.           Follow-up Information   Follow up with MAGICK-Rashanda Magloire, MD. (As needed if symptoms worsen)    Contact information:   [address] [place] [address] [phone] (cell)       Follow up with [facility] In 2 weeks.   Contact information:   [address] [place] [address]        The results of significant diagnostics from this hospitalization (including imaging, microbiology, ancillary and laboratory) are listed below for reference.     Microbiology: No results found for this or any previous visit (from the past 240 hour(s)).   Labs: Basic Metabolic Panel:  Recent Labs Lab 03/17/13 1720 03/18/13 0230  NA 137 135  K 3.2* 3.7  CL 98 99  CO2 27 29  GLUCOSE 108* 129*  BUN 12 13  CREATININE 0.50 0.43*  CALCIUM 9.5 9.3   Liver Function Tests:  Recent Labs Lab 03/17/13 1720 03/18/13 0230  AST 15 14  ALT 12 11  ALKPHOS 63 57  BILITOT 0.2* 0.3  PROT 7.3 6.4  ALBUMIN 3.5 3.0*   CBC:  Recent Labs Lab 03/17/13 1720 03/18/13 0230  WBC 11.7*  11.0*  NEUTROABS 8.5*  --   HGB 11.1* 10.5*  HCT 36.3 34.6*  MCV 86.0 86.1  PLT 296 272   Cardiac Enzymes:  Recent Labs Lab 03/17/13 1720 03/17/13 2109 03/18/13 0230  TROPONINI <0.30 <0.30 <0.30   BNP: BNP (last 3 results)  Recent Labs  03/17/13 1720  PROBNP 11.9   CBG:  Recent Labs Lab 03/18/13 0732  GLUCAP 117*   SIGNED: Time coordinating discharge: Over 30 minutes  MAGICK-Isais Klipfel,  Sherlon Handing, MD  [facility] 03/18/2013, 12:32 PM Pager [phone]  If 7PM-7AM, please contact night-coverage [email or web address] Password [email or web address]

## 2013-03-18 NOTE — Care Management Note (Addendum)
    Page 1 of 1   03/19/2013     11:29:03 AM   CARE MANAGEMENT NOTE 03/19/2013  Patient:  Jessica Burke, Jessica Burke   Account Number:  [id number]  Date Initiated:  03/18/2013  Documentation initiated by:  Lanier Clam  Subjective/Objective Assessment:   ADMITTED W/CHESTPAIN.WU:[id number].     Action/Plan:   FROM HOME ALONE.HAS PCP,PHARMACY.HAS RW.   Anticipated DC Date:  03/19/2013   Anticipated DC Plan:  HOME/SELF CARE      DC Planning Services  CM consult      Choice offered to / List presented to:             Status of service:  Completed, signed off Medicare Important Message given?   (If response is "NO", the following Medicare IM given date fields will be blank) Date Medicare IM given:   Date Additional Medicare IM given:    Discharge Disposition:  HOME/SELF CARE  Per UR Regulation:  Reviewed for med. necessity/level of care/duration of stay  If discussed at Long Length of Stay Meetings, dates discussed:    Comments:  03/19/13 Kejon Feild RN,BSN NCM [phone] D/C HOME YESTERDAY NO ORDERS OR NEEDS.  03/18/13 Sariah Henkin RN,BSN NCM [phone]

## 2013-05-08 ENCOUNTER — Other Ambulatory Visit: Payer: Self-pay

## 2013-05-08 DIAGNOSIS — Z1231 Encounter for screening mammogram for malignant neoplasm of breast: Secondary | ICD-10-CM

## 2013-06-05 ENCOUNTER — Ambulatory Visit
Admission: RE | Admit: 2013-06-05 | Discharge: 2013-06-05 | Disposition: A | Discharge status: Home / Self Care | Payer: Medicare Other | Source: Ambulatory Visit

## 2013-06-05 DIAGNOSIS — Z1231 Encounter for screening mammogram for malignant neoplasm of breast: Secondary | ICD-10-CM

## 2014-06-02 ENCOUNTER — Other Ambulatory Visit: Payer: Self-pay

## 2014-06-02 DIAGNOSIS — Z1231 Encounter for screening mammogram for malignant neoplasm of breast: Secondary | ICD-10-CM

## 2014-06-25 ENCOUNTER — Ambulatory Visit
Admission: RE | Admit: 2014-06-25 | Discharge: 2014-06-25 | Disposition: A | Discharge status: Home / Self Care | Payer: Medicare Other | Source: Ambulatory Visit

## 2014-06-25 DIAGNOSIS — Z1231 Encounter for screening mammogram for malignant neoplasm of breast: Secondary | ICD-10-CM

## 2014-06-28 ENCOUNTER — Encounter (HOSPITAL_COMMUNITY): Payer: Self-pay | Admitting: Emergency Medicine

## 2014-11-19 ENCOUNTER — Inpatient Hospital Stay (HOSPITAL_COMMUNITY)
Admission: EM | Admit: 2014-11-19 | Discharge: 2014-11-22 | DRG: 193 | Disposition: A | Discharge status: Home / Self Care | Payer: Medicare HMO | Attending: Internal Medicine | Admitting: Internal Medicine

## 2014-11-19 ENCOUNTER — Encounter (HOSPITAL_COMMUNITY): Payer: Self-pay | Admitting: Emergency Medicine

## 2014-11-19 DIAGNOSIS — J9601 Acute respiratory failure with hypoxia: Secondary | ICD-10-CM | POA: Diagnosis present

## 2014-11-19 DIAGNOSIS — M359 Systemic involvement of connective tissue, unspecified: Secondary | ICD-10-CM | POA: Diagnosis present

## 2014-11-19 DIAGNOSIS — J159 Unspecified bacterial pneumonia: Secondary | ICD-10-CM | POA: Diagnosis present

## 2014-11-19 DIAGNOSIS — J1008 Influenza due to other identified influenza virus with other specified pneumonia: Secondary | ICD-10-CM | POA: Diagnosis not present

## 2014-11-19 DIAGNOSIS — E876 Hypokalemia: Secondary | ICD-10-CM | POA: Diagnosis present

## 2014-11-19 DIAGNOSIS — Z79899 Other long term (current) drug therapy: Secondary | ICD-10-CM

## 2014-11-19 DIAGNOSIS — M3320 Polymyositis, organ involvement unspecified: Secondary | ICD-10-CM | POA: Diagnosis present

## 2014-11-19 DIAGNOSIS — Z79891 Long term (current) use of opiate analgesic: Secondary | ICD-10-CM

## 2014-11-19 DIAGNOSIS — Z7952 Long term (current) use of systemic steroids: Secondary | ICD-10-CM

## 2014-11-19 DIAGNOSIS — I10 Essential (primary) hypertension: Secondary | ICD-10-CM | POA: Diagnosis present

## 2014-11-19 DIAGNOSIS — R05 Cough: Secondary | ICD-10-CM | POA: Diagnosis not present

## 2014-11-19 DIAGNOSIS — J9600 Acute respiratory failure, unspecified whether with hypoxia or hypercapnia: Secondary | ICD-10-CM

## 2014-11-19 DIAGNOSIS — J189 Pneumonia, unspecified organism: Secondary | ICD-10-CM

## 2014-11-19 DIAGNOSIS — J101 Influenza due to other identified influenza virus with other respiratory manifestations: Secondary | ICD-10-CM | POA: Diagnosis present

## 2014-11-19 DIAGNOSIS — G710 Muscular dystrophy: Secondary | ICD-10-CM | POA: Diagnosis present

## 2014-11-19 DIAGNOSIS — R059 Cough, unspecified: Secondary | ICD-10-CM

## 2014-11-19 DIAGNOSIS — Z8249 Family history of ischemic heart disease and other diseases of the circulatory system: Secondary | ICD-10-CM

## 2014-11-19 HISTORY — DX: Muscular dystrophy, unspecified: G71.00

## 2014-11-19 NOTE — ED Notes (Signed)
Patient arrives c/o flu like symptoms. Patient states she is cold, has a headache, nausea, and generalized body aches. Patient states she did receive a flu shot this season. Patient reports decreased PO intake r/t the nausea. Patient states she took Theraflu at 1500 and 1930, denies any symptom relief.

## 2014-11-20 ENCOUNTER — Emergency Department (HOSPITAL_COMMUNITY): Payer: Medicare HMO

## 2014-11-20 DIAGNOSIS — R05 Cough: Secondary | ICD-10-CM | POA: Diagnosis present

## 2014-11-20 DIAGNOSIS — J101 Influenza due to other identified influenza virus with other respiratory manifestations: Secondary | ICD-10-CM | POA: Diagnosis present

## 2014-11-20 DIAGNOSIS — G710 Muscular dystrophy: Secondary | ICD-10-CM | POA: Diagnosis present

## 2014-11-20 DIAGNOSIS — M359 Systemic involvement of connective tissue, unspecified: Secondary | ICD-10-CM

## 2014-11-20 DIAGNOSIS — Z79899 Other long term (current) drug therapy: Secondary | ICD-10-CM | POA: Diagnosis not present

## 2014-11-20 DIAGNOSIS — J159 Unspecified bacterial pneumonia: Secondary | ICD-10-CM | POA: Diagnosis present

## 2014-11-20 DIAGNOSIS — J1008 Influenza due to other identified influenza virus with other specified pneumonia: Secondary | ICD-10-CM | POA: Diagnosis present

## 2014-11-20 DIAGNOSIS — J111 Influenza due to unidentified influenza virus with other respiratory manifestations: Secondary | ICD-10-CM

## 2014-11-20 DIAGNOSIS — Z7952 Long term (current) use of systemic steroids: Secondary | ICD-10-CM | POA: Diagnosis not present

## 2014-11-20 DIAGNOSIS — J189 Pneumonia, unspecified organism: Secondary | ICD-10-CM | POA: Diagnosis not present

## 2014-11-20 DIAGNOSIS — I10 Essential (primary) hypertension: Secondary | ICD-10-CM | POA: Diagnosis present

## 2014-11-20 DIAGNOSIS — Z8249 Family history of ischemic heart disease and other diseases of the circulatory system: Secondary | ICD-10-CM | POA: Diagnosis not present

## 2014-11-20 DIAGNOSIS — J9601 Acute respiratory failure with hypoxia: Secondary | ICD-10-CM | POA: Diagnosis present

## 2014-11-20 DIAGNOSIS — M3320 Polymyositis, organ involvement unspecified: Secondary | ICD-10-CM | POA: Diagnosis present

## 2014-11-20 DIAGNOSIS — E876 Hypokalemia: Secondary | ICD-10-CM | POA: Diagnosis present

## 2014-11-20 DIAGNOSIS — Z79891 Long term (current) use of opiate analgesic: Secondary | ICD-10-CM | POA: Diagnosis not present

## 2014-11-20 LAB — INFLUENZA PANEL BY PCR (TYPE A & B)
H1N1 flu by pcr: DETECTED — AB
Influenza A By PCR: POSITIVE — AB
Influenza B By PCR: NEGATIVE

## 2014-11-20 LAB — CBC WITH DIFFERENTIAL/PLATELET
Basophils Absolute: 0 10*3/uL (ref 0.0–0.1)
Basophils Relative: 0 % (ref 0–1)
Eosinophils Absolute: 0 10*3/uL (ref 0.0–0.7)
Eosinophils Relative: 0 % (ref 0–5)
HCT: 38.4 % (ref 36.0–46.0)
Hemoglobin: 11.6 g/dL — ABNORMAL LOW (ref 12.0–15.0)
Lymphocytes Relative: 10 % — ABNORMAL LOW (ref 12–46)
Lymphs Abs: 1.1 10*3/uL (ref 0.7–4.0)
MCH: 27 pg (ref 26.0–34.0)
MCHC: 30.2 g/dL (ref 30.0–36.0)
MCV: 89.3 fL (ref 78.0–100.0)
Monocytes Absolute: 0.5 10*3/uL (ref 0.1–1.0)
Monocytes Relative: 4 % (ref 3–12)
Neutro Abs: 9.5 10*3/uL — ABNORMAL HIGH (ref 1.7–7.7)
Neutrophils Relative %: 86 % — ABNORMAL HIGH (ref 43–77)
Platelets: 218 10*3/uL (ref 150–400)
RBC: 4.3 MIL/uL (ref 3.87–5.11)
RDW: 16.7 % — ABNORMAL HIGH (ref 11.5–15.5)
WBC: 11 10*3/uL — ABNORMAL HIGH (ref 4.0–10.5)

## 2014-11-20 LAB — I-STAT CHEM 8, ED
BUN: 9 mg/dL (ref 6–23)
Calcium, Ion: 1.02 mmol/L — ABNORMAL LOW (ref 1.12–1.23)
Chloride: 97 mmol/L (ref 96–112)
Creatinine, Ser: 0.4 mg/dL — ABNORMAL LOW (ref 0.50–1.10)
Glucose, Bld: 102 mg/dL — ABNORMAL HIGH (ref 70–99)
HCT: 41 % (ref 36.0–46.0)
Hemoglobin: 13.9 g/dL (ref 12.0–15.0)
Potassium: 3.3 mmol/L — ABNORMAL LOW (ref 3.5–5.1)
Sodium: 132 mmol/L — ABNORMAL LOW (ref 135–145)
TCO2: 22 mmol/L (ref 0–100)

## 2014-11-20 LAB — STREP PNEUMONIAE URINARY ANTIGEN: Strep Pneumo Urinary Antigen: NEGATIVE

## 2014-11-20 MED ORDER — AZITHROMYCIN 500 MG IV SOLR
500.0000 mg | INTRAVENOUS | Status: DC
  Administered 2014-11-21 – 2014-11-22 (×2): 500 mg via INTRAVENOUS
  Filled 2014-11-20 (×2): qty 500

## 2014-11-20 MED ORDER — IPRATROPIUM BROMIDE 0.02 % IN SOLN
0.5000 mg | Freq: Once | RESPIRATORY_TRACT | Status: AC
  Administered 2014-11-20: 0.5 mg via RESPIRATORY_TRACT
  Filled 2014-11-20: qty 2.5

## 2014-11-20 MED ORDER — CEFTRIAXONE SODIUM 1 G IJ SOLR
1.0000 g | INTRAMUSCULAR | Status: DC
  Administered 2014-11-21 – 2014-11-22 (×2): 1 g via INTRAVENOUS
  Filled 2014-11-20 (×2): qty 10

## 2014-11-20 MED ORDER — CALCIUM-VITAMIN D-VITAMIN K 500-500-40 MG-UNT-MCG PO CHEW: CHEWABLE_TABLET | Freq: Every day | ORAL | Status: DC

## 2014-11-20 MED ORDER — GUAIFENESIN-CODEINE 100-10 MG/5ML PO SOLN
5.0000 mL | Freq: Four times a day (QID) | ORAL | Status: DC | PRN
  Administered 2014-11-20 – 2014-11-21 (×2): 5 mL via ORAL
  Filled 2014-11-20 (×2): qty 5

## 2014-11-20 MED ORDER — AZATHIOPRINE 50 MG PO TABS
200.0000 mg | ORAL_TABLET | Freq: Every day | ORAL | Status: DC
  Administered 2014-11-20 – 2014-11-22 (×3): 200 mg via ORAL
  Filled 2014-11-20 (×3): qty 4

## 2014-11-20 MED ORDER — FAMOTIDINE 20 MG PO TABS
20.0000 mg | ORAL_TABLET | Freq: Two times a day (BID) | ORAL | Status: DC
  Administered 2014-11-20 – 2014-11-22 (×5): 20 mg via ORAL
  Filled 2014-11-20 (×6): qty 1

## 2014-11-20 MED ORDER — HYDROCODONE-ACETAMINOPHEN 5-325 MG PO TABS
1.0000 | ORAL_TABLET | ORAL | Status: DC | PRN
  Administered 2014-11-20: 1 via ORAL
  Filled 2014-11-20: qty 2

## 2014-11-20 MED ORDER — IRBESARTAN 300 MG PO TABS
300.0000 mg | ORAL_TABLET | Freq: Every day | ORAL | Status: DC
  Administered 2014-11-20 – 2014-11-21 (×2): 300 mg via ORAL
  Filled 2014-11-20 (×2): qty 1

## 2014-11-20 MED ORDER — HEPARIN SODIUM (PORCINE) 5000 UNIT/ML IJ SOLN
5000.0000 [IU] | Freq: Three times a day (TID) | INTRAMUSCULAR | Status: DC
  Administered 2014-11-20 – 2014-11-22 (×8): 5000 [IU] via SUBCUTANEOUS
  Filled 2014-11-20 (×10): qty 1

## 2014-11-20 MED ORDER — ALBUTEROL SULFATE (2.5 MG/3ML) 0.083% IN NEBU
5.0000 mg | INHALATION_SOLUTION | Freq: Once | RESPIRATORY_TRACT | Status: AC
Start: 2014-11-20 — End: 2014-11-20
  Administered 2014-11-20: 5 mg via RESPIRATORY_TRACT
  Filled 2014-11-20: qty 6

## 2014-11-20 MED ORDER — DEXTROSE 5 % IV SOLN
500.0000 mg | Freq: Once | INTRAVENOUS | Status: AC
  Administered 2014-11-20: 500 mg via INTRAVENOUS
  Filled 2014-11-20: qty 500

## 2014-11-20 MED ORDER — HYDROCODONE-ACETAMINOPHEN 5-325 MG PO TABS
1.0000 | ORAL_TABLET | Freq: Once | ORAL | Status: AC
  Administered 2014-11-20: 1 via ORAL
  Filled 2014-11-20: qty 1

## 2014-11-20 MED ORDER — ONDANSETRON HCL 4 MG/2ML IJ SOLN
4.0000 mg | Freq: Four times a day (QID) | INTRAMUSCULAR | Status: DC | PRN
  Administered 2014-11-20: 4 mg via INTRAVENOUS
  Filled 2014-11-20: qty 2

## 2014-11-20 MED ORDER — METOCLOPRAMIDE HCL 10 MG PO TABS
10.0000 mg | ORAL_TABLET | Freq: Once | ORAL | Status: AC
  Administered 2014-11-20: 10 mg via ORAL
  Filled 2014-11-20: qty 1

## 2014-11-20 MED ORDER — POTASSIUM CHLORIDE CRYS ER 20 MEQ PO TBCR
40.0000 meq | EXTENDED_RELEASE_TABLET | Freq: Once | ORAL | Status: AC
  Administered 2014-11-20: 40 meq via ORAL
  Filled 2014-11-20: qty 2

## 2014-11-20 MED ORDER — PREDNISONE 10 MG PO TABS
10.0000 mg | ORAL_TABLET | Freq: Every day | ORAL | Status: DC
  Administered 2014-11-20 – 2014-11-22 (×3): 10 mg via ORAL
  Filled 2014-11-20 (×3): qty 1

## 2014-11-20 MED ORDER — NAPROXEN 500 MG PO TABS: 500.0000 mg | ORAL_TABLET | Freq: Once | ORAL | Status: DC

## 2014-11-20 MED ORDER — ACETAMINOPHEN 325 MG PO TABS: 650.0000 mg | ORAL_TABLET | Freq: Four times a day (QID) | ORAL | Status: DC | PRN

## 2014-11-20 MED ORDER — TELMISARTAN-HCTZ 80-12.5 MG PO TABS: 1.0000 | ORAL_TABLET | Freq: Every day | ORAL | Status: DC

## 2014-11-20 MED ORDER — OSELTAMIVIR PHOSPHATE 75 MG PO CAPS
75.0000 mg | ORAL_CAPSULE | Freq: Two times a day (BID) | ORAL | Status: DC
  Administered 2014-11-20 – 2014-11-22 (×5): 75 mg via ORAL
  Filled 2014-11-20 (×6): qty 1

## 2014-11-20 MED ORDER — HYDROCHLOROTHIAZIDE 12.5 MG PO CAPS
12.5000 mg | ORAL_CAPSULE | Freq: Every day | ORAL | Status: DC
  Administered 2014-11-20 – 2014-11-21 (×2): 12.5 mg via ORAL
  Filled 2014-11-20 (×2): qty 1

## 2014-11-20 MED ORDER — POTASSIUM CHLORIDE 10 MEQ/100ML IV SOLN
10.0000 meq | Freq: Once | INTRAVENOUS | Status: AC
  Administered 2014-11-20: 10 meq via INTRAVENOUS
  Filled 2014-11-20: qty 100

## 2014-11-20 MED ORDER — NAPHAZOLINE HCL 0.1 % OP SOLN
2.0000 [drp] | Freq: Four times a day (QID) | OPHTHALMIC | Status: DC | PRN
  Filled 2014-11-20: qty 15

## 2014-11-20 MED ORDER — METHOTREXATE 2.5 MG PO TABS: 10.0000 mg | ORAL_TABLET | ORAL | Status: DC

## 2014-11-20 MED ORDER — DEXTROSE 5 % IV SOLN
1.0000 g | Freq: Once | INTRAVENOUS | Status: AC
  Administered 2014-11-20: 1 g via INTRAVENOUS
  Filled 2014-11-20: qty 10

## 2014-11-20 MED ORDER — FOLIC ACID 1 MG PO TABS
1.0000 mg | ORAL_TABLET | Freq: Every day | ORAL | Status: DC
  Administered 2014-11-20 – 2014-11-22 (×3): 1 mg via ORAL
  Filled 2014-11-20 (×3): qty 1

## 2014-11-20 MED ORDER — CALCIUM CARBONATE-VITAMIN D 500-200 MG-UNIT PO TABS
1.0000 | ORAL_TABLET | Freq: Every day | ORAL | Status: DC
  Administered 2014-11-20 – 2014-11-22 (×3): 1 via ORAL
  Filled 2014-11-20 (×4): qty 1

## 2014-11-20 NOTE — ED Notes (Signed)
Notified RN,Kari pt. I-stat Chem 8 results potassium 3.3

## 2014-11-20 NOTE — Progress Notes (Signed)
This shift pt arrived to unit via stretcher room 1520. Alert and oriented x3. pain 3/10. Oriented to room and callbell w/ no complications. Patient guide at bedside. VS  Taken. Oxygen saturation 85% RA. Placed on 2L, increased to 98%. Initial assessment completed. Will continue to monitor pt.

## 2014-11-20 NOTE — ED Notes (Signed)
Pt ambulated to bathroom w/o O2, O2 sats dropped to 85% on RA.  While at rest pt's O2 sat went between 90-94% on RA.  Claiborne Billings, [place] notified.

## 2014-11-20 NOTE — H&P (Signed)
[facility] History and Physical  KASUMI DITULLIO XTK:[id number] DOB: [birth year] DOA: 11/19/2014  Referring physician: EDP PCP: Tommy Medal, MD   Chief Complaint: ILI   HPI: Jessica Burke is a 54 y.o. female who presents to the ED with 24 hour history of headache, body aches, fever, nausea, non-productive cough, SOB.  Symptoms onset yesterday suddenly and have persisted throughout the day today.  Theraflu she took at home provided minimal relief.  Did have a grandchild with "strep throat" symptoms last week but no other sick contacts.  Review of Systems: Systems reviewed.  As above, otherwise negative  Past Medical History  Diagnosis Date  . Hypertension   . Polymyositis   . MD (muscular dystrophy)    Past Surgical History  Procedure Laterality Date  . Endometrial ablation    . Cesarean section    . Leep     Social History:  reports that she has never smoked. She does not have any smokeless tobacco history on file. She reports that she does not drink alcohol or use illicit drugs.  No Known Allergies  Family History  Problem Relation Age of Onset  . Hypertension Father      Prior to Admission medications   Medication Sig Start Date End Date Taking? Authorizing Provider  alendronate (FOSAMAX) 70 MG tablet Take 70 mg by mouth every 7 (seven) days. Take with a full glass of water on an empty stomach.   Yes Historical Provider, MD  azaTHIOprine (IMURAN) 50 MG tablet Take 200 mg by mouth daily.    Yes Historical Provider, MD  Calcium-Vitamin D-Vitamin K (VIACTIV PO) Take 2 Units by mouth daily.   Yes Historical Provider, MD  ergocalciferol (VITAMIN D2) 50000 UNITS capsule Take 50,000 Units by mouth once a week.    Historical Provider, MD  folic acid (FOLVITE) 1 MG tablet Take 1 mg by mouth daily.   Yes Historical Provider, MD  HYDROcodone-acetaminophen (NORCO/VICODIN) 5-325 MG per tablet Take 1-2 tablets by mouth every 4 (four) hours as needed. Patient not taking:  Reported on 11/20/2014 03/18/13   Theodis Blaze, MD  methotrexate (RHEUMATREX) 2.5 MG tablet Take 10 mg by mouth once a week. Caution:Chemotherapy. Protect from light.   Yes Historical Provider, MD  predniSONE (DELTASONE) 5 MG tablet Take 10 mg by mouth daily.    Yes Historical Provider, MD  ranitidine (ZANTAC) 150 MG tablet Take 150 mg by mouth 2 (two) times daily as needed for heartburn.    Yes Historical Provider, MD  telmisartan-hydrochlorothiazide (MICARDIS HCT) 80-12.5 MG per tablet Take 1 tablet by mouth daily.   Yes Historical Provider, MD  tetrahydrozoline-zinc (VISINE-AC) 0.05-0.25 % ophthalmic solution Place 2 drops into both eyes 3 (three) times daily as needed. For dry eyes    Historical Provider, MD   Physical Exam: Filed Vitals:   11/20/14 0415  BP: 121/66  Pulse: 100  Temp:   Resp:     BP 121/66 mmHg  Pulse 100  Temp(Src) 98.4 F (36.9 C) (Oral)  Resp 22  Ht 5\' 3"  (1.6 m)  Wt 82.555 kg (182 lb)  BMI 32.25 kg/m2  SpO2 98%  General Appearance:    Alert, oriented, no distress, appears stated age  Head:    Normocephalic, atraumatic  Eyes:    PERRL, EOMI, sclera non-icteric        Nose:   Nares without drainage or epistaxis. Mucosa, turbinates normal  Throat:   Moist mucous membranes. Oropharynx without erythema or exudate.  Neck:  Supple. No carotid bruits.  No thyromegaly.  No lymphadenopathy.   Back:     No CVA tenderness, no spinal tenderness  Lungs:     Clear to auscultation bilaterally, without wheezes, rhonchi or rales  Chest wall:    No tenderness to palpitation  Heart:    Regular rate and rhythm without murmurs, gallops, rubs  Abdomen:     Soft, non-tender, nondistended, normal bowel sounds, no organomegaly  Genitalia:    deferred  Rectal:    deferred  Extremities:   No clubbing, cyanosis or edema.  Pulses:   2+ and symmetric all extremities  Skin:   Skin color, texture, turgor normal, no rashes or lesions  Lymph nodes:   Cervical, supraclavicular, and  axillary nodes normal  Neurologic:   CNII-XII intact. Normal strength, sensation and reflexes      throughout    Labs on Admission:  Basic Metabolic Panel:  Recent Labs Lab 11/20/14 0403  NA 132*  K 3.3*  CL 97  GLUCOSE 102*  BUN 9  CREATININE 0.40*   Liver Function Tests: No results for input(s): AST, ALT, ALKPHOS, BILITOT, PROT, ALBUMIN in the last 168 hours. No results for input(s): LIPASE, AMYLASE in the last 168 hours. No results for input(s): AMMONIA in the last 168 hours. CBC:  Recent Labs Lab 11/20/14 0353 11/20/14 0403  WBC 11.0*  --   NEUTROABS 9.5*  --   HGB 11.6* 13.9  HCT 38.4 41.0  MCV 89.3  --   PLT 218  --    Cardiac Enzymes: No results for input(s): CKTOTAL, CKMB, CKMBINDEX, TROPONINI in the last 168 hours.  BNP (last 3 results) No results for input(s): PROBNP in the last 8760 hours. CBG: No results for input(s): GLUCAP in the last 168 hours.  Radiological Exams on Admission: Dg Chest 2 View  11/20/2014   CLINICAL DATA:  Cough, nausea, generalized body aches for several days.  EXAM: CHEST  2 VIEW  COMPARISON:  03/17/2013  FINDINGS: Very low lung volumes. Patchy opacity at both lung bases, atelectasis versus pneumonia. There is bronchial thickening. The heart size is normal for technique. No large pleural effusion or pneumothorax. No acute osseous abnormalities are seen.  IMPRESSION: Hypoventilatory chest. Patchy opacity at both lung bases, atelectasis versus pneumonia.   Electronically Signed   By: Jeb Levering M.D.   On: 11/20/2014 02:25    EKG: Independently reviewed.  Assessment/Plan Principal Problem:   CAP (community acquired pneumonia) Active Problems:   Influenza-like illness   Autoimmune disease   1. CAP with ILI - possible influenza virus vs bacterial CAP 1. PNA pathway 2. Rocephin and azithromycin 3. Cultures pending 4. Flu PCR pending 5. Empiric tamiflu 2. 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 1. Chronic and stable 2. Continue home prednisone, MTX, and imuran    Code Status: Full Code  Family Communication: No family in room Disposition Plan: admit to inpatient   Time spent: 70 min  Brynleigh Sequeira M. [facility] Pager [phone]  If 7AM-7PM, please contact the day team taking care of the patient [email or web address] Password [facility] 11/20/2014, 4:42 AM

## 2014-11-20 NOTE — ED Provider Notes (Signed)
CSN: [id number]     Arrival date & time 11/19/14  2317 History   First MD Initiated Contact with Patient 11/20/14 0112     Chief Complaint  Patient presents with  . flu like symptoms     HA, body aches, "cold", nausea     (Consider location/radiation/quality/duration/timing/severity/associated sxs/prior Treatment) HPI Comments: 54 year old female with a history of hypertension, polymyositis, and muscular dystrophy presents to the emergency department for further evaluation of a cough. Patient states that cough began last night. She describes the cough is dry. Symptoms have been associated with nausea, headaches, myalgias, and dry heaves without emesis. Patient denies fever, but does endorse chills. She had a few episodes of watery diarrhea yesterday. Patient reports that her grandson was sick with a viral infection last week. She has tried taking TheraFlu for symptoms without improvement. Patient does state that she had her flu shot this ear. She denies syncope, vomiting, melena or hematochezia, and urinary symptoms.  The history is provided by the patient. No language interpreter was used.    Past Medical History  Diagnosis Date  . Hypertension   . Polymyositis   . MD (muscular dystrophy)    Past Surgical History  Procedure Laterality Date  . Endometrial ablation    . Cesarean section    . Leep     Family History  Problem Relation Age of Onset  . Hypertension Father    History  Substance Use Topics  . Smoking status: Never Smoker   . Smokeless tobacco: Not on file  . Alcohol Use: No   OB History    Gravida Para Term Preterm AB TAB SAB Ectopic Multiple Living   1 1              Review of Systems  Constitutional: Positive for chills and fatigue. Negative for fever.  Respiratory: Positive for cough.   Gastrointestinal: Positive for nausea.  Musculoskeletal: Positive for myalgias.  Neurological: Positive for headaches.  All other systems reviewed and are  negative.   Allergies  Review of patient's allergies indicates no known allergies.  Home Medications   Prior to Admission medications   Medication Sig Start Date End Date Taking? Authorizing Provider  alendronate (FOSAMAX) 70 MG tablet Take 70 mg by mouth every 7 (seven) days. Take with a full glass of water on an empty stomach.   Yes Historical Provider, MD  azaTHIOprine (IMURAN) 50 MG tablet Take 200 mg by mouth daily.    Yes Historical Provider, MD  Calcium-Vitamin D-Vitamin K (VIACTIV PO) Take 2 Units by mouth daily.   Yes Historical Provider, MD  ergocalciferol (VITAMIN D2) 50000 UNITS capsule Take 50,000 Units by mouth once a week.    Historical Provider, MD  folic acid (FOLVITE) 1 MG tablet Take 1 mg by mouth daily.   Yes Historical Provider, MD  HYDROcodone-acetaminophen (NORCO/VICODIN) 5-325 MG per tablet Take 1-2 tablets by mouth every 4 (four) hours as needed. Patient not taking: Reported on 11/20/2014 03/18/13   Theodis Blaze, MD  methotrexate (RHEUMATREX) 2.5 MG tablet Take 10 mg by mouth once a week. Caution:Chemotherapy. Protect from light.   Yes Historical Provider, MD  predniSONE (DELTASONE) 5 MG tablet Take 10 mg by mouth daily.    Yes Historical Provider, MD  ranitidine (ZANTAC) 150 MG tablet Take 150 mg by mouth 2 (two) times daily as needed for heartburn.    Yes Historical Provider, MD  telmisartan-hydrochlorothiazide (MICARDIS HCT) 80-12.5 MG per tablet Take 1 tablet by mouth daily.  Yes Historical Provider, MD  tetrahydrozoline-zinc (VISINE-AC) 0.05-0.25 % ophthalmic solution Place 2 drops into both eyes 3 (three) times daily as needed. For dry eyes    Historical Provider, MD   BP 109/76 mmHg  Pulse 111  Temp(Src) 98.4 F (36.9 C) (Oral)  Resp 22  Ht 5\' 3"  (1.6 m)  Wt 182 lb (82.555 kg)  BMI 32.25 kg/m2  SpO2 96%   Physical Exam  Constitutional: She is oriented to person, place, and time. She appears well-developed and well-nourished. No distress.   Nontoxic/nonseptic appearing  HENT:  Head: Normocephalic and atraumatic.  Dry mm  Eyes: Conjunctivae and EOM are normal. No scleral icterus.  Neck: Normal range of motion.  Cardiovascular: Normal rate, regular rhythm and intact distal pulses.   Pulmonary/Chest: Effort normal. No respiratory distress. She has wheezes. She has no rales.  Decreased BSs in b/l bases. Faint expiratory wheezing diffusely. No tachypnea or dyspnea.  Abdominal: Soft. She exhibits no distension. There is no tenderness.  Soft, nontender  Musculoskeletal: Normal range of motion.  Neurological: She is alert and oriented to person, place, and time. She exhibits normal muscle tone. Coordination normal.  GCS 15. Speech is goal oriented.  Skin: Skin is warm and dry. No rash noted. She is not diaphoretic. No erythema. No pallor.  Psychiatric: She has a normal mood and affect. Her behavior is normal.  Nursing note and vitals reviewed.   ED Course  Procedures (including critical care time) Labs Review Labs Reviewed  CBC WITH DIFFERENTIAL/PLATELET - Abnormal; Notable for the following:    WBC 11.0 (*)    Hemoglobin 11.6 (*)    RDW 16.7 (*)    Neutrophils Relative % 86 (*)    Neutro Abs 9.5 (*)    Lymphocytes Relative 10 (*)    All other components within normal limits  I-STAT CHEM 8, ED - Abnormal; Notable for the following:    Sodium 132 (*)    Potassium 3.3 (*)    Creatinine, Ser 0.40 (*)    Glucose, Bld 102 (*)    Calcium, Ion 1.02 (*)    All other components within normal limits  CULTURE, BLOOD (ROUTINE X 2)  CULTURE, BLOOD (ROUTINE X 2)  CULTURE, EXPECTORATED SPUTUM-ASSESSMENT  GRAM STAIN  INFLUENZA PANEL BY PCR (TYPE A & B, H1N1)  HIV ANTIBODY (ROUTINE TESTING)  LEGIONELLA ANTIGEN, URINE  STREP PNEUMONIAE URINARY ANTIGEN    Imaging Review Dg Chest 2 View  11/20/2014   CLINICAL DATA:  Cough, nausea, generalized body aches for several days.  EXAM: CHEST  2 VIEW  COMPARISON:  03/17/2013  FINDINGS:  Very low lung volumes. Patchy opacity at both lung bases, atelectasis versus pneumonia. There is bronchial thickening. The heart size is normal for technique. No large pleural effusion or pneumothorax. No acute osseous abnormalities are seen.  IMPRESSION: Hypoventilatory chest. Patchy opacity at both lung bases, atelectasis versus pneumonia.   Electronically Signed   By: Jeb Levering M.D.   On: 11/20/2014 02:25     EKG Interpretation None      MDM   Final diagnoses:  Cough  CAP (community acquired pneumonia)    54 year old female presents to the emergency department for further evaluation of cough and upper respiratory symptoms with myalgias and headache. Patient is on methotrexate and chornic prednisone, though is afebrile with only a mild leukocytosis which is reassuring. CXR shows atelectasis vs PNA. Will cover with abx given immunosuppression. Patient also tx with DuoNeb. She felt improvement in her symptoms  with this, but did desat to 85% on RA with ambulation. Given chronic immunosuppression with hypoxia and upper respiratory symptoms, will admit for further monitoring and management. Flu swab sent. Case discussed with Dr. Alcario Drought who will admit.   Filed Vitals:   11/20/14 0316 11/20/14 0345 11/20/14 0400 11/20/14 0415  BP: 109/76 126/76 131/66 121/66  Pulse: 111 110 106 100  Temp:      TempSrc:      Resp: 22     Height:      Weight:      SpO2: 96% 99% 97% 98%     Antonietta Breach, PA-C 11/20/14 0530  Everlene Balls, MD 11/20/14 [phone]

## 2014-11-20 NOTE — Progress Notes (Signed)
[facility] PROGRESS NOTE  Jessica Burke QMV:[id number] DOB: 10/03/1960 DOA: 11/19/2014 PCP: Tommy Medal, MD  Patient seen and examined. Admission H&P from early this morning reviewed. The 54 year old female with history of hypertension, muscular dystrophy and polymyositis presented to the ED with 1 day history of generalized body aches, fevers, nausea, nonproductive cough headache and shortness of breath.  Chest x-ray showing bilateral lung base opacity. Patient admitted for community-acquired pneumonia and was positive for influenza A.   Assessment/Plan: Community acquired pneumonia Continue empiric Rocephin and azithromycin. Follow blood culture, HIV antibody, urine for strep antigen and Legionella. Supportive care with antitussives and Tylenol. Patient feels her symptoms to have markedly improved since admission.  Influenza A Patient had flulike symptoms on presentation. Placed on empiric Tamiflu. Flu PCR positive. Continue supportive care. Continue droplet precautions.  Polymyositis Continue home dose prednisone, azothioprim and methotrexate.  Essential hypertension Resume home medications.  Hypokalemia Replenish potassium  Diet: Low sodium  DVT prophylaxis: Subcutaneous heparin  Code Status: Full code Family Communication: Husband at bedside Disposition Plan: Possibly tomorrow if symptoms continue to improve   Consultants:  None  Procedures: None    Antibiotics/ antiviral:  IV Rocephin and azithromycin since 3/26  Tamiflu 3/26--  HPI/Subjective: Seen and examined. Reports her headache and body aches to have significantly improved.  Objective: Filed Vitals:   11/20/14 0954  BP: 128/72  Pulse: 100  Temp:   Resp:    No intake or output data in the 24 hours ending 11/20/14 1128 Filed Weights   11/19/14 2332 11/20/14 0554  Weight: 82.555 kg (182 lb) 82.736 kg (182 lb 6.4 oz)    Exam:   General:  Elderly female in no acute  distress  HEENT: Moist oral mucosa  Cardiovascular: S1 and S2, no murmurs  Respiratory: clear b/l  Abdomen:, Nondistended, nontender  Musculoskeletal: warm, no edema  Data Reviewed: Basic Metabolic Panel:  Recent Labs Lab 11/20/14 0403  NA 132*  K 3.3*  CL 97  GLUCOSE 102*  BUN 9  CREATININE 0.40*   Liver Function Tests: No results for input(s): AST, ALT, ALKPHOS, BILITOT, PROT, ALBUMIN in the last 168 hours. No results for input(s): LIPASE, AMYLASE in the last 168 hours. No results for input(s): AMMONIA in the last 168 hours. CBC:  Recent Labs Lab 11/20/14 0353 11/20/14 0403  WBC 11.0*  --   NEUTROABS 9.5*  --   HGB 11.6* 13.9  HCT 38.4 41.0  MCV 89.3  --   PLT 218  --    Cardiac Enzymes: No results for input(s): CKTOTAL, CKMB, CKMBINDEX, TROPONINI in the last 168 hours. BNP (last 3 results) No results for input(s): BNP in the last 8760 hours.  ProBNP (last 3 results) No results for input(s): PROBNP in the last 8760 hours.  CBG: No results for input(s): GLUCAP in the last 168 hours.  No results found for this or any previous visit (from the past 240 hour(s)).   Studies: Dg Chest 2 View  11/20/2014   CLINICAL DATA:  Cough, nausea, generalized body aches for several days.  EXAM: CHEST  2 VIEW  COMPARISON:  03/17/2013  FINDINGS: Very low lung volumes. Patchy opacity at both lung bases, atelectasis versus pneumonia. There is bronchial thickening. The heart size is normal for technique. No large pleural effusion or pneumothorax. No acute osseous abnormalities are seen.  IMPRESSION: Hypoventilatory chest. Patchy opacity at both lung bases, atelectasis versus pneumonia.   Electronically Signed   By: Jeb Levering M.D.   On:  11/20/2014 02:25    Scheduled Meds: . azaTHIOprine  200 mg Oral Daily  . [START ON 11/21/2014] azithromycin  500 mg Intravenous Q24H  . calcium-vitamin D  1 tablet Oral Q breakfast  . [START ON 11/21/2014] cefTRIAXone (ROCEPHIN)  IV  1 g  Intravenous Q24H  . famotidine  20 mg Oral BID  . folic acid  1 mg Oral Daily  . heparin  5,000 Units Subcutaneous 3 times per day  . irbesartan  300 mg Oral Daily   And  . hydrochlorothiazide  12.5 mg Oral Daily  . oseltamivir  75 mg Oral BID  . potassium chloride  40 mEq Oral Once  . predniSONE  10 mg Oral Daily   Continuous Infusions:     Time spent: 20 minutes    [name], [place]  [facility] Pager [phone]. If 7PM-7AM, please contact night-coverage at [email or web address], password [facility] 11/20/2014, 11:28 AM  LOS: 0 days

## 2014-11-21 DIAGNOSIS — J9601 Acute respiratory failure with hypoxia: Secondary | ICD-10-CM

## 2014-11-21 DIAGNOSIS — J101 Influenza due to other identified influenza virus with other respiratory manifestations: Secondary | ICD-10-CM

## 2014-11-21 DIAGNOSIS — J9600 Acute respiratory failure, unspecified whether with hypoxia or hypercapnia: Secondary | ICD-10-CM

## 2014-11-21 LAB — HIV ANTIBODY (ROUTINE TESTING W REFLEX): HIV Screen 4th Generation wRfx: NONREACTIVE

## 2014-11-21 MED ORDER — GUAIFENESIN-CODEINE 100-10 MG/5ML PO SOLN: 5.0000 mL | Freq: Four times a day (QID) | ORAL | Status: DC | PRN

## 2014-11-21 MED ORDER — POTASSIUM CHLORIDE CRYS ER 20 MEQ PO TBCR
40.0000 meq | EXTENDED_RELEASE_TABLET | ORAL | Status: AC
  Administered 2014-11-21 (×2): 40 meq via ORAL
  Filled 2014-11-21 (×2): qty 2

## 2014-11-21 MED ORDER — HYDROCHLOROTHIAZIDE 12.5 MG PO CAPS
12.5000 mg | ORAL_CAPSULE | Freq: Every day | ORAL | Status: DC
  Filled 2014-11-21: qty 1

## 2014-11-21 MED ORDER — LEVOFLOXACIN 750 MG PO TABS: 750.0000 mg | ORAL_TABLET | Freq: Every day | ORAL | Status: DC

## 2014-11-21 MED ORDER — CETYLPYRIDINIUM CHLORIDE 0.05 % MT LIQD
7.0000 mL | Freq: Two times a day (BID) | OROMUCOSAL | Status: DC
Start: 2014-11-21 — End: 2014-11-22
  Administered 2014-11-21 – 2014-11-22 (×3): 7 mL via OROMUCOSAL

## 2014-11-21 MED ORDER — IRBESARTAN 300 MG PO TABS
300.0000 mg | ORAL_TABLET | Freq: Every day | ORAL | Status: DC
  Filled 2014-11-21: qty 1

## 2014-11-21 MED ORDER — OSELTAMIVIR PHOSPHATE 75 MG PO CAPS: 75.0000 mg | ORAL_CAPSULE | Freq: Two times a day (BID) | ORAL | Status: DC

## 2014-11-21 NOTE — Progress Notes (Signed)
Pt ambulated about 50 feet from room to hall. O2 saturation dropped to 82% on room air with ambulation and as high as 86% on room air. Vwilliams,rn.

## 2014-11-21 NOTE — Progress Notes (Addendum)
[facility] Progress Note   Jessica Burke FXT:[id number] DOB: 12-25-1960 DOA: 11/19/2014 PCP: Tommy Medal, MD  Brief narrative: Jessica Burke is a 54 y.o. female with HTN,  presenting with body aches, fevers, nausea, dry cough and found to have influenza and b/l pulmonary infiltrates   Subjective: Feels much better. Wants to go home.   Assessment/Plan: Principal Problem:   Acute respiratory failure--  CAP (community acquired pneumonia), Influenza A in immunocompromised patient - cont Tamiflu, Rocephin and Zithromax - hypoxic on room air  Active Problems: Muscular dystrophy, polymyositis  - Imuran, Prednisone  Hypokalemia - replace- recheck tomorrow  HTN - cont home meds as tolerated    Appt with PCP: Code Status: full code Family Communication:  Disposition Plan:  Home in 1-2 days DVT prophylaxis: heparin Consultants: Procedures:  Antibiotics: Anti-infectives    Start     Dose/Rate Route Frequency Ordered Stop   11/21/14 0400  cefTRIAXone (ROCEPHIN) 1 g in dextrose 5 % 50 mL IVPB     1 g 100 mL/hr over 30 Minutes Intravenous Every 24 hours 11/20/14 0439 11/28/14 0359   11/21/14 0400  azithromycin (ZITHROMAX) 500 mg in dextrose 5 % 250 mL IVPB     500 mg 250 mL/hr over 60 Minutes Intravenous Every 24 hours 11/20/14 0439 11/28/14 0359   11/21/14 0000  oseltamivir (TAMIFLU) 75 MG capsule     75 mg Oral 2 times daily 11/21/14 0831     11/21/14 0000  levofloxacin (LEVAQUIN) 750 MG tablet     750 mg Oral Daily 11/21/14 0831     11/20/14 0445  oseltamivir (TAMIFLU) capsule 75 mg     75 mg Oral 2 times daily 11/20/14 0435 11/25/14 0959   11/20/14 0415  cefTRIAXone (ROCEPHIN) 1 g in dextrose 5 % 50 mL IVPB     1 g 100 mL/hr over 30 Minutes Intravenous  Once 11/20/14 0406 11/20/14 0503   11/20/14 0415  azithromycin (ZITHROMAX) 500 mg in dextrose 5 % 250 mL IVPB     500 mg 250 mL/hr over 60 Minutes Intravenous  Once 11/20/14 0406 11/20/14 0602       Objective: Filed Weights   11/19/14 2332 11/20/14 0554  Weight: 82.555 kg (182 lb) 82.736 kg (182 lb 6.4 oz)    Intake/Output Summary (Last 24 hours) at 11/21/14 1506 Last data filed at 11/21/14 1342  Gross per 24 hour  Intake    480 ml  Output   1100 ml  Net   -620 ml     Vitals Filed Vitals:   11/20/14 0954 11/20/14 1300 11/20/14 2158 11/21/14 0455  BP: 128/72 128/64 107/65 91/57  Pulse: 100 92 88 77  Temp:  99.1 F (37.3 C) 98 F (36.7 C) 98.9 F (37.2 C)  TempSrc:  Oral Oral Oral  Resp:  18 18 18   Height:      Weight:      SpO2:  98% 99% 100%    Exam:  General:  Pt is alert, not in acute distress  HEENT: No icterus, No thrush  Cardiovascular: regular rate and rhythm, S1/S2 No murmur  Respiratory: clear to auscultation bilaterally   Abdomen: Soft, +Bowel sounds, non tender, non distended, no guarding  MSK: No LE edema, cyanosis or clubbing  Data Reviewed: Basic Metabolic Panel:  Recent Labs Lab 11/20/14 0403  NA 132*  K 3.3*  CL 97  GLUCOSE 102*  BUN 9  CREATININE 0.40*   Liver Function Tests: No results for input(s): AST, ALT,  ALKPHOS, BILITOT, PROT, ALBUMIN in the last 168 hours. No results for input(s): LIPASE, AMYLASE in the last 168 hours. No results for input(s): AMMONIA in the last 168 hours. CBC:  Recent Labs Lab 11/20/14 0353 11/20/14 0403  WBC 11.0*  --   NEUTROABS 9.5*  --   HGB 11.6* 13.9  HCT 38.4 41.0  MCV 89.3  --   PLT 218  --    Cardiac Enzymes: No results for input(s): CKTOTAL, CKMB, CKMBINDEX, TROPONINI in the last 168 hours. BNP (last 3 results) No results for input(s): BNP in the last 8760 hours.  ProBNP (last 3 results) No results for input(s): PROBNP in the last 8760 hours.  CBG: No results for input(s): GLUCAP in the last 168 hours.  Recent Results (from the past 240 hour(s))  Culture, blood (routine x 2) Call MD if unable to obtain prior to antibiotics being given     Status: None (Preliminary  result)   Collection Time: 11/20/14  5:04 AM  Result Value Ref Range Status   Specimen Description BLOOD RIGHT ARM  Final   Special Requests BOTTLES DRAWN AEROBIC AND ANAEROBIC 2CC  Final   Culture   Final           BLOOD CULTURE RECEIVED NO GROWTH TO DATE CULTURE WILL BE HELD FOR 5 DAYS BEFORE ISSUING A FINAL NEGATIVE REPORT Performed at [facility]    Report Status PENDING  Incomplete  Culture, blood (routine x 2) Call MD if unable to obtain prior to antibiotics being given     Status: None (Preliminary result)   Collection Time: 11/20/14  6:40 AM  Result Value Ref Range Status   Specimen Description BLOOD RIGHT ARM  Final   Special Requests   Final    BOTTLES DRAWN AEROBIC AND ANAEROBIC 10CC BOTH BOTTLES   Culture   Final           BLOOD CULTURE RECEIVED NO GROWTH TO DATE CULTURE WILL BE HELD FOR 5 DAYS BEFORE ISSUING A FINAL NEGATIVE REPORT Note: Culture results may be compromised due to an excessive volume of blood received in culture bottles. Performed at [facility]    Report Status PENDING  Incomplete     Studies:  Recent x-ray studies have been reviewed in detail by the Attending Physician  Scheduled Meds:  Scheduled Meds: . antiseptic oral rinse  7 mL Mouth Rinse BID  . azaTHIOprine  200 mg Oral Daily  . azithromycin  500 mg Intravenous Q24H  . calcium-vitamin D  1 tablet Oral Q breakfast  . cefTRIAXone (ROCEPHIN)  IV  1 g Intravenous Q24H  . famotidine  20 mg Oral BID  . folic acid  1 mg Oral Daily  . heparin  5,000 Units Subcutaneous 3 times per day  . irbesartan  300 mg Oral Daily   And  . hydrochlorothiazide  12.5 mg Oral Daily  . oseltamivir  75 mg Oral BID  . predniSONE  10 mg Oral Daily   Continuous Infusions:   Time spent on care of this patient: 35 min  [place], MD 11/21/2014, 3:06 PM  LOS: 1 day   [facility] Office  [phone] Pager - Text Page per [email or web address]  If 7PM-7AM, please contact  night-coverage [email or web address]

## 2014-11-22 LAB — BASIC METABOLIC PANEL
Anion gap: 10 (ref 5–15)
BUN: 8 mg/dL (ref 6–23)
CO2: 28 mmol/L (ref 19–32)
Calcium: 8.7 mg/dL (ref 8.4–10.5)
Chloride: 101 mmol/L (ref 96–112)
Creatinine, Ser: 0.34 mg/dL — ABNORMAL LOW (ref 0.50–1.10)
GFR calc Af Amer: >90 mL/min (ref >90)
GFR calc non Af Amer: >90 mL/min (ref >90)
Glucose, Bld: 119 mg/dL — ABNORMAL HIGH (ref 70–99)
Potassium: 4.4 mmol/L (ref 3.5–5.1)
Sodium: 139 mmol/L (ref 135–145)

## 2014-11-22 LAB — CBC
HCT: 37 % (ref 36.0–46.0)
Hemoglobin: 11 g/dL — ABNORMAL LOW (ref 12.0–15.0)
MCH: 26.9 pg (ref 26.0–34.0)
MCHC: 29.7 g/dL — ABNORMAL LOW (ref 30.0–36.0)
MCV: 90.5 fL (ref 78.0–100.0)
Platelets: 236 10*3/uL (ref 150–400)
RBC: 4.09 MIL/uL (ref 3.87–5.11)
RDW: 16.7 % — ABNORMAL HIGH (ref 11.5–15.5)
WBC: 6.8 10*3/uL (ref 4.0–10.5)

## 2014-11-22 LAB — LEGIONELLA ANTIGEN, URINE

## 2014-11-22 MED ORDER — OSELTAMIVIR PHOSPHATE 75 MG PO CAPS: 75.0000 mg | ORAL_CAPSULE | Freq: Two times a day (BID) | ORAL | Status: DC

## 2014-11-22 MED ORDER — LEVOFLOXACIN 750 MG PO TABS: 750.0000 mg | ORAL_TABLET | Freq: Every day | ORAL | Status: DC

## 2014-11-22 NOTE — Care Management Note (Signed)
    Page 1 of 1   11/22/2014     3:10:45 PM CARE MANAGEMENT NOTE 11/22/2014  Patient:  Jessica Burke, Jessica Burke   Account Number:  [id number]  Date Initiated:  11/22/2014  Documentation initiated by:  Sunday Spillers  Subjective/Objective Assessment:   54 yo female admitted with PNA, hypoxia. PTA lived at home with spouse.     Action/Plan:   Home when stable   Anticipated DC Date:  11/23/2014   Anticipated DC Plan:  [place]  CM consult      Choice offered to / List presented to:             Status of service:  Completed, signed off Medicare Important Message given?   (If response is "NO", the following Medicare IM given date fields will be blank) Date Medicare IM given:   Medicare IM given by:   Date Additional Medicare IM given:   Additional Medicare IM given by:    Discharge Disposition:  HOME/SELF CARE  Per UR Regulation:  Reviewed for med. necessity/level of care/duration of stay  If discussed at [place] of Stay Meetings, dates discussed:    Comments:

## 2014-11-22 NOTE — Progress Notes (Addendum)
SATURATION QUALIFICATIONS: (This note is used to comply with regulatory documentation for home oxygen)  Patient Saturations on Room Air at Rest = 100%  Patient Saturations on Room Air while Ambulating = 98-93%  Patient Saturations on 0 Liters of oxygen while Ambulating = 98-93%  Please briefly explain why patient needs home oxygen: Pt stayed above 88% while ambulating  Chimaobi Casebolt, Barbee Shropshire, RN

## 2014-11-22 NOTE — Discharge Summary (Addendum)
Physician Discharge Summary  MONTASIA CHISENHALL IDP:[id number] DOB: July 27, 1961 DOA: 11/19/2014  PCP: Tommy Medal, MD  Admit date: 11/19/2014 Discharge date: 11/22/2014  Time spent: 55 minutes  Follow up - BP in 90s - she checks her BP at home and knows to hold Micardis when low  - needs to see PCP in 1 wk  Discharge Condition: stable Diet recommendation: heart healthy, low sodium  Discharge Diagnoses:  Principal Problem:   Acute respiratory failure Active Problems:   CAP (community acquired pneumonia)   Influenza A (H1N1)   Autoimmune disease   HTN  History of present illness:  Jessica Burke is a 54 y.o. female with HTN, presenting with body aches, fevers, nausea, dry cough and found to have influenza and b/l pulmonary infiltrates  Hospital Course:  Principal Problem:  Acute respiratory failure-- CAP (community acquired pneumonia), Influenza A in immunocompromised patient  - as she is immune compromised, treating for flu and bacterial pneumonia - cont Tamiflu-  Rocephin and Zithromax changed to Levaquin- will complete a 7 day course  - hypoxic on room air yesterday and therefore was kept overnight- today is 93% or greater on rest and exertion on room air and can go home  Active Problems:  Muscular dystrophy, polymyositis  - Imuran, Prednisone   Hypokalemia  -adequately replaced  HTN  - held Micardis today as SBP in 90s  Procedures:  none  Consultations:    Discharge Exam: Filed Weights   11/19/14 2332 11/20/14 0554  Weight: 82.555 kg (182 lb) 82.736 kg (182 lb 6.4 oz)   Filed Vitals:   11/22/14 1435  BP: 91/62  Pulse: 111  Temp: 98.2 F (36.8 C)  Resp: 20    General: AAO x 3, no distress Cardiovascular: RRR, no murmurs  Respiratory: clear to auscultation bilaterally GI: soft, non-tender, non-distended, bowel sound positive  Discharge Instructions You were cared for by a hospitalist during your hospital stay. If you have any questions about  your discharge medications or the care you received while you were in the hospital after you are discharged, you can call the unit and asked to speak with the hospitalist on call if the hospitalist that took care of you is not available. Once you are discharged, your primary care physician will handle any further medical issues. Please note that NO REFILLS for any discharge medications will be authorized once you are discharged, as it is imperative that you return to your primary care physician (or establish a relationship with a primary care physician if you do not have one) for your aftercare needs so that they can reassess your need for medications and monitor your lab values.  Discharge Instructions    Diet - low sodium heart healthy    Complete by:  As directed      Diet - low sodium heart healthy    Complete by:  As directed      Discharge instructions    Complete by:  As directed   Resume Micardis when top number on you BP goes above 130     Increase activity slowly    Complete by:  As directed      Increase activity slowly    Complete by:  As directed             Medication List    STOP taking these medications        telmisartan-hydrochlorothiazide 80-12.5 MG per tablet  Commonly known as:  MICARDIS HCT      TAKE these  medications        alendronate 70 MG tablet  Commonly known as:  FOSAMAX  Take 70 mg by mouth every 7 (seven) days. Take with a full glass of water on an empty stomach.     azaTHIOprine 50 MG tablet  Commonly known as:  IMURAN  Take 200 mg by mouth daily.     ergocalciferol 50000 UNITS capsule  Commonly known as:  VITAMIN D2  Take 50,000 Units by mouth once a week.     folic acid 1 MG tablet  Commonly known as:  FOLVITE  Take 1 mg by mouth daily.     HYDROcodone-acetaminophen 5-325 MG per tablet  Commonly known as:  NORCO/VICODIN  Take 1-2 tablets by mouth every 4 (four) hours as needed.     levofloxacin 750 MG tablet  Commonly known as:   LEVAQUIN  Take 1 tablet (750 mg total) by mouth daily.     methotrexate 2.5 MG tablet  Commonly known as:  RHEUMATREX  Take 10 mg by mouth once a week. Caution:Chemotherapy. Protect from light.     oseltamivir 75 MG capsule  Commonly known as:  TAMIFLU  Take 1 capsule (75 mg total) by mouth 2 (two) times daily.     predniSONE 5 MG tablet  Commonly known as:  DELTASONE  Take 10 mg by mouth daily.     ranitidine 150 MG tablet  Commonly known as:  ZANTAC  Take 150 mg by mouth 2 (two) times daily as needed for heartburn.     tetrahydrozoline-zinc 0.05-0.25 % ophthalmic solution  Commonly known as:  VISINE-AC  Place 2 drops into both eyes 3 (three) times daily as needed. For dry eyes     VIACTIV PO  Take 2 Units by mouth daily.       No Known Allergies    The results of significant diagnostics from this hospitalization (including imaging, microbiology, ancillary and laboratory) are listed below for reference.    Significant Diagnostic Studies: Dg Chest 2 View  11/20/2014   CLINICAL DATA:  Cough, nausea, generalized body aches for several days.  EXAM: CHEST  2 VIEW  COMPARISON:  03/17/2013  FINDINGS: Very low lung volumes. Patchy opacity at both lung bases, atelectasis versus pneumonia. There is bronchial thickening. The heart size is normal for technique. No large pleural effusion or pneumothorax. No acute osseous abnormalities are seen.  IMPRESSION: Hypoventilatory chest. Patchy opacity at both lung bases, atelectasis versus pneumonia.   Electronically Signed   By: Jeb Levering M.D.   On: 11/20/2014 02:25    Microbiology: Recent Results (from the past 240 hour(s))  Culture, blood (routine x 2) Call MD if unable to obtain prior to antibiotics being given     Status: None (Preliminary result)   Collection Time: 11/20/14  5:04 AM  Result Value Ref Range Status   Specimen Description BLOOD RIGHT ARM  Final   Special Requests BOTTLES DRAWN AEROBIC AND ANAEROBIC 2CC  Final    Culture   Final           BLOOD CULTURE RECEIVED NO GROWTH TO DATE CULTURE WILL BE HELD FOR 5 DAYS BEFORE ISSUING A FINAL NEGATIVE REPORT Performed at [facility]    Report Status PENDING  Incomplete  Culture, blood (routine x 2) Call MD if unable to obtain prior to antibiotics being given     Status: None (Preliminary result)   Collection Time: 11/20/14  6:40 AM  Result Value Ref Range Status  Specimen Description BLOOD RIGHT ARM  Final   Special Requests   Final    BOTTLES DRAWN AEROBIC AND ANAEROBIC 10CC BOTH BOTTLES   Culture   Final           BLOOD CULTURE RECEIVED NO GROWTH TO DATE CULTURE WILL BE HELD FOR 5 DAYS BEFORE ISSUING A FINAL NEGATIVE REPORT Note: Culture results may be compromised due to an excessive volume of blood received in culture bottles. Performed at [facility]    Report Status PENDING  Incomplete     Labs: Basic Metabolic Panel:  Recent Labs Lab 11/20/14 0403 11/22/14 0550  NA 132* 139  K 3.3* 4.4  CL 97 101  CO2  --  28  GLUCOSE 102* 119*  BUN 9 8  CREATININE 0.40* 0.34*  CALCIUM  --  8.7   Liver Function Tests: No results for input(s): AST, ALT, ALKPHOS, BILITOT, PROT, ALBUMIN in the last 168 hours. No results for input(s): LIPASE, AMYLASE in the last 168 hours. No results for input(s): AMMONIA in the last 168 hours. CBC:  Recent Labs Lab 11/20/14 0353 11/20/14 0403 11/22/14 0550  WBC 11.0*  --  6.8  NEUTROABS 9.5*  --   --   HGB 11.6* 13.9 11.0*  HCT 38.4 41.0 37.0  MCV 89.3  --  90.5  PLT 218  --  236   Cardiac Enzymes: No results for input(s): CKTOTAL, CKMB, CKMBINDEX, TROPONINI in the last 168 hours. BNP: BNP (last 3 results) No results for input(s): BNP in the last 8760 hours.  ProBNP (last 3 results) No results for input(s): PROBNP in the last 8760 hours.  CBG: No results for input(s): GLUCAP in the last 168 hours.     SignedDebbe Odea, MD [facility] 11/22/2014, 3:47 PM

## 2014-11-26 LAB — CULTURE, BLOOD (ROUTINE X 2)
Culture: NO GROWTH
Culture: NO GROWTH

## 2015-04-17 ENCOUNTER — Encounter (HOSPITAL_COMMUNITY): Payer: Self-pay | Admitting: Emergency Medicine

## 2015-04-17 ENCOUNTER — Emergency Department (HOSPITAL_COMMUNITY): Payer: Medicare HMO

## 2015-04-17 ENCOUNTER — Emergency Department (HOSPITAL_COMMUNITY)
Admission: EM | Admit: 2015-04-17 | Discharge: 2015-04-17 | Disposition: A | Discharge status: Home / Self Care | Payer: Medicare HMO | Attending: Emergency Medicine | Admitting: Emergency Medicine

## 2015-04-17 DIAGNOSIS — Y9389 Activity, other specified: Secondary | ICD-10-CM | POA: Diagnosis not present

## 2015-04-17 DIAGNOSIS — Z8669 Personal history of other diseases of the nervous system and sense organs: Secondary | ICD-10-CM | POA: Insufficient documentation

## 2015-04-17 DIAGNOSIS — Z8739 Personal history of other diseases of the musculoskeletal system and connective tissue: Secondary | ICD-10-CM | POA: Diagnosis not present

## 2015-04-17 DIAGNOSIS — I10 Essential (primary) hypertension: Secondary | ICD-10-CM | POA: Diagnosis not present

## 2015-04-17 DIAGNOSIS — W1839XA Other fall on same level, initial encounter: Secondary | ICD-10-CM | POA: Insufficient documentation

## 2015-04-17 DIAGNOSIS — T148XXA Other injury of unspecified body region, initial encounter: Secondary | ICD-10-CM

## 2015-04-17 DIAGNOSIS — S3992XA Unspecified injury of lower back, initial encounter: Secondary | ICD-10-CM | POA: Insufficient documentation

## 2015-04-17 DIAGNOSIS — Z79899 Other long term (current) drug therapy: Secondary | ICD-10-CM | POA: Insufficient documentation

## 2015-04-17 DIAGNOSIS — Y9289 Other specified places as the place of occurrence of the external cause: Secondary | ICD-10-CM | POA: Insufficient documentation

## 2015-04-17 DIAGNOSIS — Y998 Other external cause status: Secondary | ICD-10-CM | POA: Insufficient documentation

## 2015-04-17 DIAGNOSIS — W19XXXA Unspecified fall, initial encounter: Secondary | ICD-10-CM

## 2015-04-17 LAB — URINALYSIS, ROUTINE W REFLEX MICROSCOPIC
Bilirubin Urine: NEGATIVE
Glucose, UA: NEGATIVE mg/dL
Ketones, ur: NEGATIVE mg/dL
Leukocytes, UA: NEGATIVE
Nitrite: NEGATIVE
Protein, ur: NEGATIVE mg/dL
Specific Gravity, Urine: 1.008 (ref 1.005–1.030)
Urobilinogen, UA: 0.2 mg/dL (ref 0.0–1.0)
pH: 6 (ref 5.0–8.0)

## 2015-04-17 LAB — BASIC METABOLIC PANEL
Anion gap: 9 (ref 5–15)
BUN: 13 mg/dL (ref 6–20)
CO2: 28 mmol/L (ref 22–32)
Calcium: 9 mg/dL (ref 8.9–10.3)
Chloride: 105 mmol/L (ref 101–111)
Creatinine, Ser: <0.3 mg/dL — ABNORMAL LOW (ref 0.44–1.00)
Glucose, Bld: 96 mg/dL (ref 65–99)
Potassium: 3.7 mmol/L (ref 3.5–5.1)
Sodium: 142 mmol/L (ref 135–145)

## 2015-04-17 LAB — CBC WITH DIFFERENTIAL/PLATELET
Basophils Absolute: 0 10*3/uL (ref 0.0–0.1)
Basophils Relative: 0 % (ref 0–1)
Eosinophils Absolute: 0.1 10*3/uL (ref 0.0–0.7)
Eosinophils Relative: 1 % (ref 0–5)
HCT: 38.4 % (ref 36.0–46.0)
Hemoglobin: 11.4 g/dL — ABNORMAL LOW (ref 12.0–15.0)
Lymphocytes Relative: 15 % (ref 12–46)
Lymphs Abs: 1.7 10*3/uL (ref 0.7–4.0)
MCH: 26.6 pg (ref 26.0–34.0)
MCHC: 29.7 g/dL — ABNORMAL LOW (ref 30.0–36.0)
MCV: 89.5 fL (ref 78.0–100.0)
Monocytes Absolute: 0.7 10*3/uL (ref 0.1–1.0)
Monocytes Relative: 6 % (ref 3–12)
Neutro Abs: 8.4 10*3/uL — ABNORMAL HIGH (ref 1.7–7.7)
Neutrophils Relative %: 78 % — ABNORMAL HIGH (ref 43–77)
Platelets: 266 10*3/uL (ref 150–400)
RBC: 4.29 MIL/uL (ref 3.87–5.11)
RDW: 17.5 % — ABNORMAL HIGH (ref 11.5–15.5)
WBC: 10.8 10*3/uL — ABNORMAL HIGH (ref 4.0–10.5)

## 2015-04-17 LAB — URINE MICROSCOPIC-ADD ON

## 2015-04-17 MED ORDER — DIAZEPAM 5 MG PO TABS
5.0000 mg | ORAL_TABLET | Freq: Once | ORAL | Status: AC
  Administered 2015-04-17: 5 mg via ORAL
  Filled 2015-04-17: qty 1

## 2015-04-17 NOTE — ED Notes (Signed)
Went back to collect labs and patient is on her way to xray

## 2015-04-17 NOTE — ED Notes (Signed)
Bed: [facility] Expected date:  Expected time:  Means of arrival:  Comments: 

## 2015-04-17 NOTE — ED Notes (Signed)
Unable to collect labs at this time Nurse Tech working with patient. 

## 2015-04-17 NOTE — Discharge Instructions (Signed)
Contusion °A contusion is a deep bruise. Contusions are the result of an injury that caused bleeding under the skin. The contusion may turn blue, purple, or yellow. Minor injuries will give you a painless contusion, but more severe contusions may stay painful and swollen for a few weeks.  °CAUSES  °A contusion is usually caused by a blow, trauma, or direct force to an area of the body. °SYMPTOMS  °· Swelling and redness of the injured area. °· Bruising of the injured area. °· Tenderness and soreness of the injured area. °· Pain. °DIAGNOSIS  °The diagnosis can be made by taking a history and physical exam. An X-ray, CT scan, or MRI may be needed to determine if there were any associated injuries, such as fractures. °TREATMENT  °Specific treatment will depend on what area of the body was injured. In general, the best treatment for a contusion is resting, icing, elevating, and applying cold compresses to the injured area. Over-the-counter medicines may also be recommended for pain control. Ask your caregiver what the best treatment is for your contusion. °HOME CARE INSTRUCTIONS  °· Put ice on the injured area. °¨ Put ice in a plastic bag. °¨ Place a towel between your skin and the bag. °¨ Leave the ice on for 15-20 minutes, 3-4 times a day, or as directed by your health care provider. °· Only take over-the-counter or prescription medicines for pain, discomfort, or fever as directed by your caregiver. Your caregiver may recommend avoiding anti-inflammatory medicines (aspirin, ibuprofen, and naproxen) for 48 hours because these medicines may increase bruising. °· Rest the injured area. °· If possible, elevate the injured area to reduce swelling. °SEEK IMMEDIATE MEDICAL CARE IF:  °· You have increased bruising or swelling. °· You have pain that is getting worse. °· Your swelling or pain is not relieved with medicines. °MAKE SURE YOU:  °· Understand these instructions. °· Will watch your condition. °· Will get help right  away if you are not doing well or get worse. °Document Released: 05/23/2005 Document Revised: 08/18/2013 Document Reviewed: 06/18/2011 °ExitCare® Patient Information ©2015 [employer]. This information is not intended to replace advice given to you by your health care provider. Make sure you discuss any questions you have with your health care provider. ° °

## 2015-04-17 NOTE — ED Provider Notes (Signed)
CSN: [id number]     Arrival date & time 04/17/15  1033 History   First MD Initiated Contact with Patient 04/17/15 1046     Chief Complaint  Patient presents with  . Fall  . Back Pain     (Consider location/radiation/quality/duration/timing/severity/associated sxs/prior Treatment) Patient is a 54 y.o. female presenting with fall and back pain.  Fall This is a recurrent problem. The current episode started 1 to 2 hours ago. Episode frequency: once. The problem has not changed since onset.Pertinent negatives include no chest pain, no abdominal pain, no headaches and no shortness of breath. Exacerbated by: movement, palpation. Nothing relieves the symptoms.  Back Pain Location:  Lumbar spine (L flank) Quality:  Aching Radiates to:  Does not radiate Pain severity:  Moderate Onset quality:  Sudden Duration:  1 day Timing:  Constant Progression:  Unchanged Chronicity:  New Context: falling   Relieved by:  Nothing Worsened by:  Nothing tried Associated symptoms: no abdominal pain, no chest pain, no headaches, no numbness, no perianal numbness, no tingling and no weakness     Past Medical History  Diagnosis Date  . Hypertension   . Polymyositis   . MD (muscular dystrophy)    Past Surgical History  Procedure Laterality Date  . Endometrial ablation    . Cesarean section    . Leep     Family History  Problem Relation Age of Onset  . Hypertension Father    Social History  Substance Use Topics  . Smoking status: Never Smoker   . Smokeless tobacco: None  . Alcohol Use: No   OB History    Gravida Para Term Preterm AB TAB SAB Ectopic Multiple Living   1 1             Review of Systems  Respiratory: Negative for shortness of breath.   Cardiovascular: Negative for chest pain.  Gastrointestinal: Negative for abdominal pain.  Musculoskeletal: Positive for back pain.  Neurological: Negative for tingling, weakness, numbness and headaches.  All other systems reviewed and are  negative.     Allergies  Review of patient's allergies indicates no known allergies.  Home Medications   Prior to Admission medications   Medication Sig Start Date End Date Taking? Authorizing Provider  azaTHIOprine (IMURAN) 50 MG tablet Take 200 mg by mouth daily.    Yes Historical Provider, MD  Calcium-Vitamin D-Vitamin K (VIACTIV PO) Take 2 Units by mouth daily.   Yes Historical Provider, MD  folic acid (FOLVITE) 1 MG tablet Take 1 mg by mouth daily.   Yes Historical Provider, MD  methotrexate (RHEUMATREX) 2.5 MG tablet Take 10 mg by mouth once a week. Caution:Chemotherapy. Protect from light.   Yes Historical Provider, MD  predniSONE (DELTASONE) 5 MG tablet Take 10 mg by mouth daily.    Yes Historical Provider, MD  ranitidine (ZANTAC) 150 MG tablet Take 150 mg by mouth 2 (two) times daily as needed for heartburn.    Yes Historical Provider, MD  telmisartan-hydrochlorothiazide (MICARDIS HCT) 80-12.5 MG per tablet Take 1 tablet by mouth daily.   Yes Historical Provider, MD  alendronate (FOSAMAX) 70 MG tablet Take 70 mg by mouth every 7 (seven) days. Take with a full glass of water on an empty stomach.    Historical Provider, MD  ergocalciferol (VITAMIN D2) 50000 UNITS capsule Take 50,000 Units by mouth once a week.    Historical Provider, MD  HYDROcodone-acetaminophen (NORCO/VICODIN) 5-325 MG per tablet Take 1-2 tablets by mouth every 4 (four) hours  as needed. Patient not taking: Reported on 11/20/2014 03/18/13   Theodis Blaze, MD  levofloxacin (LEVAQUIN) 750 MG tablet Take 1 tablet (750 mg total) by mouth daily. Patient not taking: Reported on 04/17/2015 11/22/14   Debbe Odea, MD  oseltamivir (TAMIFLU) 75 MG capsule Take 1 capsule (75 mg total) by mouth 2 (two) times daily. Patient not taking: Reported on 04/17/2015 11/22/14   Debbe Odea, MD   BP 143/91 mmHg  Pulse 90  Temp(Src) 97.7 F (36.5 C) (Oral)  Resp 14  SpO2 91% Physical Exam  Constitutional: She is oriented to person,  place, and time. She appears well-developed and well-nourished.  HENT:  Head: Normocephalic and atraumatic.  Right Ear: External ear normal.  Left Ear: External ear normal.  Eyes: Conjunctivae and EOM are normal. Pupils are equal, round, and reactive to light.  Neck: Normal range of motion. Neck supple.  Cardiovascular: Normal rate, regular rhythm, normal heart sounds and intact distal pulses.   Pulmonary/Chest: Effort normal and breath sounds normal.  Abdominal: Soft. Bowel sounds are normal. There is no tenderness.  Musculoskeletal: Normal range of motion.       Lumbar back: She exhibits tenderness. She exhibits no bony tenderness.  Primarily L flank, inferior costal  Neurological: She is alert and oriented to person, place, and time.  Skin: Skin is warm and dry.  Vitals reviewed.   ED Course  Procedures (including critical care time) Labs Review Labs Reviewed  CBC WITH DIFFERENTIAL/PLATELET - Abnormal; Notable for the following:    WBC 10.8 (*)    Hemoglobin 11.4 (*)    MCHC 29.7 (*)    RDW 17.5 (*)    Neutrophils Relative % 78 (*)    Neutro Abs 8.4 (*)    All other components within normal limits  BASIC METABOLIC PANEL - Abnormal; Notable for the following:    Creatinine, Ser <0.30 (*)    All other components within normal limits  URINALYSIS, ROUTINE W REFLEX MICROSCOPIC (NOT AT [facility]) - Abnormal; Notable for the following:    APPearance CLOUDY (*)    Hgb urine dipstick TRACE (*)    All other components within normal limits  URINE MICROSCOPIC-ADD ON    Imaging Review Dg Ribs Unilateral W/chest Left  04/17/2015   CLINICAL DATA:  54 year old with current history of muscular dystrophy who lost her balance while at home earlier today and fell, injuring the posterior lower left ribs and the low back. Initial encounter.  EXAM: LEFT RIBS AND CHEST - 3+ VIEW  COMPARISON:  No prior rib images. Chest x-rays 11/20/2014 dating back to 10/18/2011.  FINDINGS: No fractures identified  involving the left ribs. Prominent costal cartilage calcification.  Included AP semi-erect chest demonstrates a markedly suboptimal inspiration with low lung volumes, accounting for atelectasis in the bases and prominent bronchovascular markings diffusely, unchanged. No new pulmonary parenchymal abnormalities. Cardiac silhouette mildly enlarged but stable.  IMPRESSION: 1. No left rib fractures identified. 2. Markedly suboptimal inspiration accounting for bibasilar atelectasis, unchanged when compared to prior examinations. No new/acute cardiopulmonary disease.   Electronically Signed   By: Evangeline Dakin M.D.   On: 04/17/2015 13:33   Dg Lumbar Spine Complete  04/17/2015   CLINICAL DATA:  Fall today at home. Low back injury and pain. Initial encounter.  EXAM: LUMBAR SPINE - COMPLETE 4+ VIEW  COMPARISON:  None.  FINDINGS: There is no evidence of lumbar spine fracture. Alignment is normal. Mild vertebral endplate osteophyte formation seen at several lumbar levels, however there  is no evidence of disc space narrowing. No focal lytic sclerotic bone lesions identified.  Multiple calcified uterine fibroids noted in the pelvis. A metal coin resembling a dime is seen in the left posterior lower back/buttock subcutaneous tissues.  IMPRESSION: No acute findings.  Multiple calcified uterine fibroids.  Metal coin in left posterior low back/buttock subcutaneous tissues. Recommend clinical correlation to localize foreign body.   Electronically Signed   By: Earle Gell M.D.   On: 04/17/2015 13:32   I have personally reviewed and evaluated these images and lab results as part of my medical decision-making.   EKG Interpretation None      MDM   Final diagnoses:  Fall, initial encounter  Contusion    54 y.o. female with pertinent PMH of Polymyositis, MD presents with MSK back pain after mechanical fall.  Pt has a ho recurrent falls secondary to her polymyositis.  Physical exam with reproduction of symptoms with  palpation and movement.  No historical features of cauda equina.  No recent illness, but potentially more generally weak than normal.    Wu as above unremarkable.  Symptoms improved with valium.  DC home in stable condition  I have reviewed all laboratory and imaging studies if ordered as above  1. Fall, initial encounter   2. Contusion         Debby Freiberg, MD 04/19/15 0700

## 2015-04-17 NOTE — ED Notes (Signed)
PER EMS- pt picked up from home c/o fall x1 day and 4/10 back pain.  Denies LOC, head injury, or any deformities.  Pt arrived to arrived to ED alert and oriented x4.  EMS reports upon arrival pt was lying on r side.

## 2015-06-08 DIAGNOSIS — M8580 Other specified disorders of bone density and structure, unspecified site: Secondary | ICD-10-CM | POA: Diagnosis not present

## 2015-06-08 DIAGNOSIS — M3320 Polymyositis, organ involvement unspecified: Secondary | ICD-10-CM | POA: Diagnosis not present

## 2015-06-08 DIAGNOSIS — E559 Vitamin D deficiency, unspecified: Secondary | ICD-10-CM | POA: Diagnosis not present

## 2015-06-08 DIAGNOSIS — M3322 Polymyositis with myopathy: Secondary | ICD-10-CM | POA: Diagnosis not present

## 2015-06-23 DIAGNOSIS — E7800 Pure hypercholesterolemia, unspecified: Secondary | ICD-10-CM | POA: Diagnosis not present

## 2015-06-23 DIAGNOSIS — I10 Essential (primary) hypertension: Secondary | ICD-10-CM | POA: Diagnosis not present

## 2015-06-23 DIAGNOSIS — E559 Vitamin D deficiency, unspecified: Secondary | ICD-10-CM | POA: Diagnosis not present

## 2015-06-23 DIAGNOSIS — Z79899 Other long term (current) drug therapy: Secondary | ICD-10-CM | POA: Diagnosis not present

## 2015-06-28 DIAGNOSIS — M8580 Other specified disorders of bone density and structure, unspecified site: Secondary | ICD-10-CM | POA: Diagnosis not present

## 2015-06-28 DIAGNOSIS — E559 Vitamin D deficiency, unspecified: Secondary | ICD-10-CM | POA: Diagnosis not present

## 2015-06-28 DIAGNOSIS — Z0000 Encounter for general adult medical examination without abnormal findings: Secondary | ICD-10-CM | POA: Diagnosis not present

## 2015-06-28 DIAGNOSIS — Z23 Encounter for immunization: Secondary | ICD-10-CM | POA: Diagnosis not present

## 2015-06-28 DIAGNOSIS — M3320 Polymyositis, organ involvement unspecified: Secondary | ICD-10-CM | POA: Diagnosis not present

## 2015-07-06 DIAGNOSIS — M3320 Polymyositis, organ involvement unspecified: Secondary | ICD-10-CM | POA: Diagnosis not present

## 2015-07-06 DIAGNOSIS — R6889 Other general symptoms and signs: Secondary | ICD-10-CM | POA: Diagnosis not present

## 2015-07-06 DIAGNOSIS — Z79899 Other long term (current) drug therapy: Secondary | ICD-10-CM | POA: Diagnosis not present

## 2015-07-06 DIAGNOSIS — M8580 Other specified disorders of bone density and structure, unspecified site: Secondary | ICD-10-CM | POA: Diagnosis not present

## 2015-07-27 ENCOUNTER — Ambulatory Visit: Payer: Medicare HMO | Admitting: Neurology

## 2015-08-03 ENCOUNTER — Encounter: Payer: Self-pay | Admitting: Neurology

## 2015-08-03 ENCOUNTER — Ambulatory Visit (INDEPENDENT_AMBULATORY_CARE_PROVIDER_SITE_OTHER): Payer: Medicare HMO | Admitting: Neurology

## 2015-08-03 ENCOUNTER — Encounter (INDEPENDENT_AMBULATORY_CARE_PROVIDER_SITE_OTHER): Payer: Self-pay

## 2015-08-03 VITALS — BP 143/99 | HR 94 | Ht 63.0 in | Wt 182.5 lb

## 2015-08-03 DIAGNOSIS — M3320 Polymyositis, organ involvement unspecified: Secondary | ICD-10-CM | POA: Diagnosis not present

## 2015-08-03 NOTE — Patient Instructions (Signed)
Polymyositis Polymyositis is a disease that causes inflammation and weakness of your muscles. It can also affect your skin tissues. Polymyositis is also known as idiopathic inflammatory myopathy. It is often associated with diseases in which the body attacks its own cells (autoimmune diseases).  CAUSES The cause of polymyositis is not known. RISK FACTORS  Gender. Polymyositis is more common in women.  Age. The disease is more common in adults. SIGNS AND SYMPTOMS Symptoms may include:  Weakness in your:  Neck.  Shoulder.  Upper arm.  Hip.  Thigh.  Having difficulty:  Rising from a seated position.  Climbing stairs.  Lifting objects.  Reaching overhead.  Swallowing (dysphagia).  Sore muscles.  Fatigue.  Fever.  Hard bumps under your skin.  Unexplained weight loss. DIAGNOSIS  To diagnose polymyositis, your health care provider will take your medical history and do a physical exam. Various tests may also be done, including:   Blood tests.  MRI.  A test to check electrical activity of the muscle (electromyography).  An exam of a sample of muscle tissue (biopsy). TREATMENT There is no cure for polymyositis, but treatment can improve muscle strength and function. The earlier treatment that is started, the more effective it is. Treatment may include:  Corticosteroid medicines. These help with inflammation.  Immunoglobulin medicines. These contain healthy antibodies from blood donors.  Immunosuppressive medicines.  Physical therapy.  Speech and language therapy. HOME CARE INSTRUCTIONS  Take medicines only as directed by your health care provider.  Work closely with all of your therapists, if necessary.  Protect your skin from the sun. Wear sunscreen or protective clothes when you are outside.  Stay active. An exercise routine can help you to build and maintain muscle strength. Talk with your health care provider or your physical therapist before you  start any exercise program.  Rest when you are tired.  Keep all follow-up visits as directed by your health care provider. This is important. SEEK MEDICAL CARE IF: You develop new or worsening muscle weakness. SEEK IMMEDIATE MEDICAL CARE IF:  You have trouble swallowing or speaking.  You have shortness of breath.   This information is not intended to replace advice given to you by your health care provider. Make sure you discuss any questions you have with your health care provider.   Document Released: 08/03/2002 Document Revised: 09/03/2014 Document Reviewed: 01/26/2014 Elsevier Interactive Patient Education [employer].

## 2015-08-03 NOTE — Progress Notes (Signed)
Reason for visit: Polymyositis  Referring physician: Dr. Hector Brunswick is a 54 y.o. 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 In the past, she has been seen by Dr. Vallarie Mare at [facility]. The patient indicates that she had a muscle biopsy sometime around 1998, this confirmed the diagnosis of polymyositis. The patient has been on medical therapy since that time, but unfortunately she has continued to progress. The patient indicates that at times during her medical therapy she has had good stability of her muscle strength, but within the last 6-8 months, the strength has declined. The patient has distal and proximal weakness at this time. Muscle enzyme levels have remained elevated. The patient has fallen on occasion, the last fall was in September 2016. The patient can no longer climb stairs, she has difficulty arising from a seated position, she requires assistance with this. She cannot get up off of her bedside commode. The patient does report some shortness of breath, she denies any issues with swallowing. She uses a cane or a walker for ambulation. She reports some incontinence of the bowels and the bladder. She does have some numbness on the anterolateral aspect of the left thigh. In the past she has been treated with Imuran and prednisone, she recently was switched to methotrexate and prednisone. The prednisone dose was increased to 60 mg a day, then tapered down to the 10 mg maintenance dose. Several years ago, she was on Enbrel as she was diagnosed with rheumatoid arthritis. She is off Enbrel at this point. She does have contact with the [facility] clinic at [facility]. The patient is sent to this office for an opinion regarding the myositis process. The patient has had EMG and nerve conduction study done in 2011 by Dr. Vallarie Mare. The report was accessed online, and it appears to reveal  findings consistent with a myositis. The patient denies any family history of myopathy. In the past, the anti-Jo-1 antibody has been negative.  Past Medical History  Diagnosis Date  . Hypertension   . Polymyositis ([place])   . MD (muscular dystrophy) [facility])     Past Surgical History  Procedure Laterality Date  . Endometrial ablation    . Cesarean section    . Leep      Family History  Problem Relation Age of Onset  . Hypertension Father   . Heart disease Father   . Hypertension Mother   . Diabetes Maternal Aunt   . Breast cancer Maternal Aunt     Social history:  reports that she quit smoking about 26 years ago. She has never used smokeless tobacco. She reports that she does not drink alcohol or use illicit drugs.  Medications:  Prior to Admission medications   Medication Sig Start Date End Date Taking? Authorizing Provider  alendronate (FOSAMAX) 70 MG tablet Take 70 mg by mouth every 7 (seven) days. Take with a full glass of water on an empty stomach.   Yes Historical Provider, MD  Calcium-Vitamin D-Vitamin K (VIACTIV PO) Take 2 Units by mouth daily.   Yes Historical Provider, MD  ergocalciferol (VITAMIN D2) 50000 UNITS capsule Take 50,000 Units by mouth once a week.   Yes Historical Provider, MD  folic acid (FOLVITE) 1 MG tablet Take 1 mg by mouth daily.   Yes Historical Provider, MD  methotrexate (RHEUMATREX) 2.5 MG tablet Take 10 mg by mouth once  a week. Caution:Chemotherapy. Protect from light.   Yes Historical Provider, MD  predniSONE (DELTASONE) 5 MG tablet Take 10 mg by mouth daily.    Yes Historical Provider, MD  ranitidine (ZANTAC) 150 MG tablet Take 150 mg by mouth 2 (two) times daily as needed for heartburn.    Yes Historical Provider, MD  telmisartan-hydrochlorothiazide (MICARDIS HCT) 80-12.5 MG per tablet Take 1 tablet by mouth daily.   Yes Historical Provider, MD     No Known Allergies  ROS:  Out of a complete 14 system review of symptoms, the patient complains  only of the following symptoms, and all other reviewed systems are negative.  Itching, moles Shortness of breath Incontinence Achy muscles Numbness, weakness  Blood pressure 143/99, pulse 94, height 5\' 3"  (1.6 m), weight 182 lb 8 oz (82.781 kg).  Physical Exam  General: The patient is alert and cooperative at the time of the examination. The patient is moderately obese.  Eyes: Pupils are equal, round, and reactive to light. Discs are flat bilaterally.  Neck: The neck is supple, no carotid bruits are noted.  Respiratory: The respiratory examination is clear.  Cardiovascular: The cardiovascular examination reveals a regular rate and rhythm, no obvious murmurs or rubs are noted.  Skin: Extremities are without significant edema.  Neurologic Exam  Mental status: The patient is alert and oriented x 3 at the time of the examination. The patient has apparent normal recent and remote memory, with an apparently normal attention span and concentration ability.  Cranial nerves: Facial symmetry is present. There is good sensation of the face to pinprick and soft touch bilaterally. The strength of the facial muscles and the muscles to head turning and shoulder shrug are normal bilaterally. Speech is well enunciated, no aphasia or dysarthria is noted. Extraocular movements are full. Visual fields are full. The tongue is midline, and the patient has symmetric elevation of the soft palate. No obvious hearing deficits are noted.  Motor: The motor testing reveals 4/5 strength of the biceps muscles bilaterally and with neck flexion. There is 4 minus/5 strength with the triceps muscles bilaterally, good distal strength in the hands. 3/5 strength of the deltoid muscles is noted, no facial weakness is seen. With the lower extremities, there is 2/5 strength with the flexion and with dorsiflexion of the feet, 3/5 strength with the hamstrings and 4/5 strength with quadriceps muscles bilaterally. Good symmetry of  strength is seen. Good symmetric motor tone is noted throughout.  Sensory: Sensory testing is intact to pinprick, soft touch, vibration sensation, and position sense on all 4 extremities. No evidence of extinction is noted.  Coordination: Cerebellar testing reveals that the patient has difficulty performing finger-nose-finger and heel-to-shin bilaterally.  Gait and station: The patient requires assistance with standing, once up, she can walk with a cane with some assistance, tandem gait was not attempted.  Reflexes: Deep tendon reflexes are symmetric, but are decreased bilaterally. Toes are downgoing bilaterally.   Assessment/Plan:  1. Polymyositis  2. Gait disorder  The patient has had regression of her underlying myopathic process. CK enzyme elevations have been persistent apparently. The patient currently has been switched to methotrexate, and she is on low-dose prednisone. She has had a relatively recent EMG study confirming a myositis process. The time course and EMG evaluation is not consistent with ALS. The patient has biopsy-proven myositis. I see no indication for a repeat EMG at this time. Given the ongoing progression, I would consider IVIG as adjunctive therapy over the next 12-18  months. If ongoing progression is noted, switching to rituximab may be indicated. The patient will follow-up through this office if needed.  Jill Alexanders MD 08/03/2015 7:45 PM  [facility] [address] [place], [place] [address]  Phone [phone] Fax [phone]

## 2015-09-16 ENCOUNTER — Other Ambulatory Visit: Payer: Self-pay

## 2015-09-16 DIAGNOSIS — Z1231 Encounter for screening mammogram for malignant neoplasm of breast: Secondary | ICD-10-CM

## 2015-10-07 ENCOUNTER — Ambulatory Visit
Admission: RE | Admit: 2015-10-07 | Discharge: 2015-10-07 | Disposition: A | Discharge status: Home / Self Care | Payer: Medicare HMO | Source: Ambulatory Visit

## 2015-10-07 DIAGNOSIS — Z1231 Encounter for screening mammogram for malignant neoplasm of breast: Secondary | ICD-10-CM | POA: Diagnosis not present

## 2015-10-12 ENCOUNTER — Encounter (HOSPITAL_COMMUNITY): Payer: Self-pay

## 2015-10-12 ENCOUNTER — Emergency Department (HOSPITAL_COMMUNITY): Payer: Medicare HMO

## 2015-10-12 ENCOUNTER — Emergency Department (HOSPITAL_COMMUNITY)
Admission: EM | Admit: 2015-10-12 | Discharge: 2015-10-12 | Disposition: A | Discharge status: Home / Self Care | Payer: Medicare HMO | Attending: Emergency Medicine | Admitting: Emergency Medicine

## 2015-10-12 DIAGNOSIS — I10 Essential (primary) hypertension: Secondary | ICD-10-CM | POA: Insufficient documentation

## 2015-10-12 DIAGNOSIS — Z8739 Personal history of other diseases of the musculoskeletal system and connective tissue: Secondary | ICD-10-CM | POA: Insufficient documentation

## 2015-10-12 DIAGNOSIS — S4992XA Unspecified injury of left shoulder and upper arm, initial encounter: Secondary | ICD-10-CM | POA: Diagnosis not present

## 2015-10-12 DIAGNOSIS — Z79899 Other long term (current) drug therapy: Secondary | ICD-10-CM | POA: Diagnosis not present

## 2015-10-12 DIAGNOSIS — Z87891 Personal history of nicotine dependence: Secondary | ICD-10-CM | POA: Insufficient documentation

## 2015-10-12 DIAGNOSIS — Z7952 Long term (current) use of systemic steroids: Secondary | ICD-10-CM | POA: Insufficient documentation

## 2015-10-12 DIAGNOSIS — S42202A Unspecified fracture of upper end of left humerus, initial encounter for closed fracture: Secondary | ICD-10-CM | POA: Diagnosis not present

## 2015-10-12 DIAGNOSIS — R079 Chest pain, unspecified: Secondary | ICD-10-CM | POA: Diagnosis not present

## 2015-10-12 DIAGNOSIS — T148 Other injury of unspecified body region: Secondary | ICD-10-CM | POA: Diagnosis not present

## 2015-10-12 DIAGNOSIS — Y998 Other external cause status: Secondary | ICD-10-CM | POA: Diagnosis not present

## 2015-10-12 DIAGNOSIS — Y9289 Other specified places as the place of occurrence of the external cause: Secondary | ICD-10-CM | POA: Diagnosis not present

## 2015-10-12 DIAGNOSIS — S42292A Other displaced fracture of upper end of left humerus, initial encounter for closed fracture: Secondary | ICD-10-CM | POA: Diagnosis not present

## 2015-10-12 DIAGNOSIS — Y9301 Activity, walking, marching and hiking: Secondary | ICD-10-CM | POA: Diagnosis not present

## 2015-10-12 DIAGNOSIS — Z8669 Personal history of other diseases of the nervous system and sense organs: Secondary | ICD-10-CM | POA: Insufficient documentation

## 2015-10-12 DIAGNOSIS — W1839XA Other fall on same level, initial encounter: Secondary | ICD-10-CM | POA: Insufficient documentation

## 2015-10-12 DIAGNOSIS — S42302A Unspecified fracture of shaft of humerus, left arm, initial encounter for closed fracture: Secondary | ICD-10-CM

## 2015-10-12 MED ORDER — OXYCODONE-ACETAMINOPHEN 5-325 MG PO TABS
1.0000 | ORAL_TABLET | Freq: Once | ORAL | Status: AC
  Administered 2015-10-12: 1 via ORAL
  Filled 2015-10-12: qty 1

## 2015-10-12 MED ORDER — ONDANSETRON 4 MG PO TBDP
4.0000 mg | ORAL_TABLET | Freq: Once | ORAL | Status: AC
  Administered 2015-10-12: 4 mg via ORAL
  Filled 2015-10-12: qty 1

## 2015-10-12 MED ORDER — OXYCODONE-ACETAMINOPHEN 5-325 MG PO TABS
1.0000 | ORAL_TABLET | ORAL | Status: DC | PRN
Start: 2015-10-12 — End: 2019-04-30

## 2015-10-12 MED ORDER — ONDANSETRON HCL 4 MG PO TABS: 4.0000 mg | ORAL_TABLET | Freq: Three times a day (TID) | ORAL | Status: DC | PRN

## 2015-10-12 NOTE — ED Notes (Addendum)
Pt brought from EMS - home.  Pt fell today while walking with walker up incline today.  Someone was helping her.  Knocked her off balance. Pt landed on left shoulder.  Pain radiates from left shoulder down to elbow.  Minimal ability to move.  No obvious deformity noted. No head injury.  No LOC.  Vitals:  138 80, hr 88, resp 18, 102 CBG

## 2015-10-12 NOTE — ED Provider Notes (Signed)
CSN: [id number]     Arrival date & time 10/12/15  1104 History  By signing my name below, I, Evelene Croon, attest that this documentation has been prepared under the direction and in the presence of non-physician practitioner, Margarita Mail, PA-C. Electronically Signed: Evelene Croon, Scribe. 10/12/2015. 1:08 PM.    Chief Complaint  Patient presents with  . Fall  . Shoulder Pain    The history is provided by the patient. No language interpreter was used.     HPI Comments:  Jessica Burke is a 55 y.o. female with a history of polymyositis and muscular dystrophy, who presents to the Emergency Department s/p fall ~ 2 hours PTA complaining of constant moderate LUE pain following the incident. Pt was being assisted going up an incline with her walker when she lost her balance and fell, landing on her left shoulder. She denies LOC and head injury. No alleviating factors noted. Pt has no other acute complaints or symptoms at this time.   Past Medical History  Diagnosis Date  . Hypertension   . Polymyositis ([place])   . MD (muscular dystrophy) [facility])    Past Surgical History  Procedure Laterality Date  . Endometrial ablation    . Cesarean section    . Leep     Family History  Problem Relation Age of Onset  . Hypertension Father   . Heart disease Father   . Hypertension Mother   . Diabetes Maternal Aunt   . Breast cancer Maternal Aunt    Social History  Substance Use Topics  . Smoking status: Former Smoker    Quit date: 12/25/1988  . Smokeless tobacco: Never Used  . Alcohol Use: No   OB History    Gravida Para Term Preterm AB TAB SAB Ectopic Multiple Living   1 1             Review of Systems  Constitutional: Negative for fever and chills.  Respiratory: Negative for shortness of breath.   Cardiovascular: Negative for chest pain.  Musculoskeletal: Positive for myalgias and arthralgias.  Neurological: Negative for syncope, numbness and headaches.    Allergies  Review of  patient's allergies indicates no known allergies.  Home Medications   Prior to Admission medications   Medication Sig Start Date End Date Taking? Authorizing Provider  alendronate (FOSAMAX) 70 MG tablet Take 70 mg by mouth every 7 (seven) days. Take with a full glass of water on an empty stomach.    Historical Provider, MD  Calcium-Vitamin D-Vitamin K (VIACTIV PO) Take 2 Units by mouth daily.    Historical Provider, MD  ergocalciferol (VITAMIN D2) 50000 UNITS capsule Take 50,000 Units by mouth once a week.    Historical Provider, MD  folic acid (FOLVITE) 1 MG tablet Take 1 mg by mouth daily.    Historical Provider, MD  methotrexate (RHEUMATREX) 2.5 MG tablet Take 10 mg by mouth once a week. Caution:Chemotherapy. Protect from light.    Historical Provider, MD  predniSONE (DELTASONE) 5 MG tablet Take 10 mg by mouth daily.     Historical Provider, MD  ranitidine (ZANTAC) 150 MG tablet Take 150 mg by mouth 2 (two) times daily as needed for heartburn.     Historical Provider, MD  telmisartan-hydrochlorothiazide (MICARDIS HCT) 80-12.5 MG per tablet Take 1 tablet by mouth daily.    Historical Provider, MD   BP 121/80 mmHg  Pulse 103  Temp(Src) 97.5 F (36.4 C) (Oral)  Resp 20  SpO2 100% Physical Exam  Constitutional:  She is oriented to person, place, and time. She appears well-developed and well-nourished. No distress.  HENT:  Head: Normocephalic and atraumatic.  Eyes: Conjunctivae are normal.  Cardiovascular: Normal rate.   Pulmonary/Chest: Effort normal.  Abdominal: She exhibits no distension.  Musculoskeletal:  No exquisite tenderness on exam  Pt is unable to move left arm. She is complaing of pain in axila She has strong grip strength and is NVI   Neurological: She is alert and oriented to person, place, and time.  Skin: Skin is warm and dry.  Psychiatric: She has a normal mood and affect.  Nursing note and vitals reviewed.   ED Course  Procedures   DIAGNOSTIC  STUDIES:  Oxygen Saturation is 100% on RA, normal by my interpretation.    COORDINATION OF CARE:  1:14 PM Discussed treatment plan with pt at bedside and pt agreed to plan.  Imaging Review Dg Shoulder Left  10/12/2015  CLINICAL DATA:  [place] in our ago landing on the left side. Shoulder and humeral pain. EXAM: LEFT SHOULDER - 2+ VIEW COMPARISON:  None. FINDINGS: Humeral head is subluxed inferiorly but not apparently completely dislocated. There is a spiral fracture of the proximal humerus, extending up the through the greater tuberosity. This does not appear displaced. Regional ribs appear normal. IMPRESSION: Inferior subluxation of the humeral head. Spiral fracture the proximal humerus extending up through the greater tuberosity. Electronically Signed   By: Nelson Chimes M.D.   On: 10/12/2015 12:39   Dg Humerus Left  10/12/2015  CLINICAL DATA:  Pain owing fall EXAM: LEFT HUMERUS - 2+ VIEW COMPARISON:  None. FINDINGS: Frontal and lateral views were obtained. There is an obliquely oriented fracture in the proximal humeral diaphysis which appears incomplete and nondisplaced. No other evidence of fracture. No dislocation. Joint spaces appear intact. IMPRESSION: Nondisplaced obliquely oriented fracture proximal humeral diaphysis which may be incomplete. No other fracture evident. No dislocation. No appreciable arthropathic change. Electronically Signed   By: Lowella Grip III M.D.   On: 10/12/2015 12:40   Results for orders placed or performed during the hospital encounter of 04/17/15  CBC with Differential  Result Value Ref Range   WBC 10.8 (H) 4.0 - 10.5 K/uL   RBC 4.29 3.87 - 5.11 MIL/uL   Hemoglobin 11.4 (L) 12.0 - 15.0 g/dL   HCT 38.4 36.0 - 46.0 %   MCV 89.5 78.0 - 100.0 fL   MCH 26.6 26.0 - 34.0 pg   MCHC 29.7 (L) 30.0 - 36.0 g/dL   RDW 17.5 (H) 11.5 - 15.5 %   Platelets 266 150 - 400 K/uL   Neutrophils Relative % 78 (H) 43 - 77 %   Neutro Abs 8.4 (H) 1.7 - 7.7 K/uL   Lymphocytes  Relative 15 12 - 46 %   Lymphs Abs 1.7 0.7 - 4.0 K/uL   Monocytes Relative 6 3 - 12 %   Monocytes Absolute 0.7 0.1 - 1.0 K/uL   Eosinophils Relative 1 0 - 5 %   Eosinophils Absolute 0.1 0.0 - 0.7 K/uL   Basophils Relative 0 0 - 1 %   Basophils Absolute 0.0 0.0 - 0.1 K/uL  Basic metabolic panel  Result Value Ref Range   Sodium 142 135 - 145 mmol/L   Potassium 3.7 3.5 - 5.1 mmol/L   Chloride 105 101 - 111 mmol/L   CO2 28 22 - 32 mmol/L   Glucose, Bld 96 65 - 99 mg/dL   BUN 13 6 - 20 mg/dL  Creatinine, Ser <0.30 (L) 0.44 - 1.00 mg/dL   Calcium 9.0 8.9 - 10.3 mg/dL   GFR calc non Af Amer NOT CALCULATED >60 mL/min   GFR calc Af Amer NOT CALCULATED >60 mL/min   Anion gap 9 5 - 15  Urinalysis, Routine w reflex microscopic (not at [facility])  Result Value Ref Range   Color, Urine YELLOW YELLOW   APPearance CLOUDY (A) CLEAR   Specific Gravity, Urine 1.008 1.005 - 1.030   pH 6.0 5.0 - 8.0   Glucose, UA NEGATIVE NEGATIVE mg/dL   Hgb urine dipstick TRACE (A) NEGATIVE   Bilirubin Urine NEGATIVE NEGATIVE   Ketones, ur NEGATIVE NEGATIVE mg/dL   Protein, ur NEGATIVE NEGATIVE mg/dL   Urobilinogen, UA 0.2 0.0 - 1.0 mg/dL   Nitrite NEGATIVE NEGATIVE   Leukocytes, UA NEGATIVE NEGATIVE  Urine microscopic-add on  Result Value Ref Range   RBC / HPF 0-2 <3 RBC/hpf   I have personally reviewed and evaluated these images as part of my medical decision-making.   MDM   Final diagnoses:  Humeral fracture, left, closed, initial encounter      Patient with nondisplaced fracture of the proximal humeral diaphysis, fracture of the humeral head, fracture of the inferior glenoid and a minimally displaced fracture of the coracoid process. I spoke with Dr. Tamera Punt, who is on call for orthopedics who asked for the CT scan. The patient will follow up with Dr. Tamera Punt in an immobilizer. The patient is been seen by care management. She is set up with home health care and left to assist the patient and out of  bed. She is follow-up with her primary care physician as soon as possible. Pt advised to follow up with orthopedics. Patient given I personally performed the services described in this documentation, which was scribed in my presence. The recorded information has been reviewed and is accurate.    while in ED, conservative therapy recommended and discussed. Patient will be discharged home & is agreeable with above plan. Returns precautions discussed. Pt appears safe for discharge.      Margarita Mail, PA-C 10/12/15 1619  Carmin Muskrat, MD 10/13/15 [phone]

## 2015-10-12 NOTE — ED Notes (Signed)
Pt and husband had a sandwich supplied by husband

## 2015-10-12 NOTE — ED Notes (Signed)
Pt advised that [facility] via [facility] was called for transport

## 2015-10-12 NOTE — ED Notes (Signed)
Will call [facility] to transport patient when her husband returns to the room. He needs to arrive at the house before [facility]. Pt advised of delay in transport.

## 2015-10-12 NOTE — Progress Notes (Addendum)
   10/12/15 0000  CM Assessment  Expected Discharge [place]  In-house Referral NA  Discharge Planning Services CM Consult  [facility] Choice Durable Medical Equipment;Home Health  Choice offered to / list presented to  Patient  DME Arranged Other see comment  DME [place] RN;PT;OT;Nurse's Aide;Social Work  [place]  Discharge [place]   Pt with muscular dystrophy, pcp dr Maudie Mercury [facility] Cm spoke with Santiago Glad & Pura Spice of [facility] to provide [facility], HHPT/OT/SW and aide - DME hoyer lift Cm showed pt ED's Clarise Cruz Stedy difference than hoyer lift  Cm discussed motorized DME processes Pt states she is working with an independent company to get a Artist Cm discussed general cost of motor and partial base is paid for by pt for these types of DME items and generally the pcp assist vs EDP/PA/NPs Pt voiced understanding CM reviewed in details medicare guidelines, home health ([place]) (length of stay in home, types of [facility] staff available, coverage, primary caregiver, up to 24 hrs before services may be started) and Private duty nursing (PDN-coverage, length of stay in the home types of staff available). CM reviewed availability of [place] SW to assist pcp to get pt to snf (if desired disposition) from the community level. CM provided pt/family with a list of [place] home health agencies and PDN.  Discussed with pt that [facility] generally only provides standard hoyer lifts not sara stedy nor motorized seat elevators Pt has DMe at home w/c, elevated commode seat, shower chair,

## 2015-10-12 NOTE — Discharge Instructions (Signed)
Humerus Fracture Treated With Immobilization °The humerus is the large bone in your upper arm. You have a broken (fractured) humerus. These fractures are easily diagnosed with X-rays. °TREATMENT  °Simple fractures which will heal without disability are treated with simple immobilization. Immobilization means you will wear a cast, splint, or sling. You have a fracture which will do well with immobilization. The fracture will heal well simply by being held in a good position until it is stable enough to begin range of motion exercises. Do not take part in activities which would further injure your arm.  °HOME CARE INSTRUCTIONS  °· Put ice on the injured area. °¨ Put ice in a plastic bag. °¨ Place a towel between your skin and the bag. °¨ Leave the ice on for 15-20 minutes, 03-04 times a day. °· If you have a cast: °¨ Do not scratch the skin under the cast using sharp or pointed objects. °¨ Check the skin around the cast every day. You may put lotion on any red or sore areas. °¨ Keep your cast dry and clean. °· If you have a splint: °¨ Wear the splint as directed. °¨ Keep your splint dry and clean. °¨ You may loosen the elastic around the splint if your fingers become numb, tingle, or turn cold or blue. °· If you have a sling: °¨ Wear the sling as directed. °· Do not put pressure on any part of your cast or splint until it is fully hardened. °· Your cast or splint can be protected during bathing with a plastic bag. Do not lower the cast or splint into water. °· Only take over-the-counter or prescription medicines for pain, discomfort, or fever as directed by your caregiver. °· Do range of motion exercises as instructed by your caregiver. °· Follow up as directed by your caregiver. This is very important in order to avoid permanent injury or disability and chronic pain. °SEEK IMMEDIATE MEDICAL CARE IF:  °· Your skin or nails in the injured arm turn blue or gray. °· Your arm feels cold or numb. °· You develop severe pain  in the injured arm. °· You are having problems with the medicines you were given. °MAKE SURE YOU:  °· Understand these instructions. °· Will watch your condition. °· Will get help right away if you are not doing well or get worse. °  °This information is not intended to replace advice given to you by your health care provider. Make sure you discuss any questions you have with your health care provider. °  °Document Released: 11/19/2000 Document Revised: 09/03/2014 Document Reviewed: 01/05/2015 °Elsevier Interactive Patient Education ©2016 [employer] Inc[employer] ° °

## 2015-10-12 NOTE — Progress Notes (Deleted)
   10/12/15 0000  CM Assessment  Expected Discharge [place]  In-house Referral NA  Discharge Planning Services CM Consult  [facility] Choice Durable Medical Equipment;Home Health  Choice offered to / list presented to  Patient

## 2015-10-12 NOTE — Progress Notes (Signed)
[facility]  [facility] is providing the following services: Harrel Lemon Lift  If patient discharges after hours, please call [phone].   Linward Headland 10/12/2015, 3:34 PM

## 2015-10-14 DIAGNOSIS — M3320 Polymyositis, organ involvement unspecified: Secondary | ICD-10-CM | POA: Diagnosis not present

## 2015-10-14 DIAGNOSIS — M359 Systemic involvement of connective tissue, unspecified: Secondary | ICD-10-CM | POA: Diagnosis not present

## 2015-10-18 DIAGNOSIS — G710 Muscular dystrophy: Secondary | ICD-10-CM | POA: Diagnosis not present

## 2015-10-18 DIAGNOSIS — I10 Essential (primary) hypertension: Secondary | ICD-10-CM | POA: Diagnosis not present

## 2015-10-18 DIAGNOSIS — M3320 Polymyositis, organ involvement unspecified: Secondary | ICD-10-CM | POA: Diagnosis not present

## 2015-10-18 DIAGNOSIS — S42202D Unspecified fracture of upper end of left humerus, subsequent encounter for fracture with routine healing: Secondary | ICD-10-CM | POA: Diagnosis not present

## 2015-10-19 DIAGNOSIS — G710 Muscular dystrophy: Secondary | ICD-10-CM | POA: Diagnosis not present

## 2015-10-19 DIAGNOSIS — I10 Essential (primary) hypertension: Secondary | ICD-10-CM | POA: Diagnosis not present

## 2015-10-19 DIAGNOSIS — S42202D Unspecified fracture of upper end of left humerus, subsequent encounter for fracture with routine healing: Secondary | ICD-10-CM | POA: Diagnosis not present

## 2015-10-19 DIAGNOSIS — M3320 Polymyositis, organ involvement unspecified: Secondary | ICD-10-CM | POA: Diagnosis not present

## 2015-10-21 DIAGNOSIS — G710 Muscular dystrophy: Secondary | ICD-10-CM | POA: Diagnosis not present

## 2015-10-21 DIAGNOSIS — I10 Essential (primary) hypertension: Secondary | ICD-10-CM | POA: Diagnosis not present

## 2015-10-21 DIAGNOSIS — M3320 Polymyositis, organ involvement unspecified: Secondary | ICD-10-CM | POA: Diagnosis not present

## 2015-10-21 DIAGNOSIS — S42202D Unspecified fracture of upper end of left humerus, subsequent encounter for fracture with routine healing: Secondary | ICD-10-CM | POA: Diagnosis not present

## 2015-10-24 DIAGNOSIS — M3320 Polymyositis, organ involvement unspecified: Secondary | ICD-10-CM | POA: Diagnosis not present

## 2015-10-24 DIAGNOSIS — G710 Muscular dystrophy: Secondary | ICD-10-CM | POA: Diagnosis not present

## 2015-10-24 DIAGNOSIS — I10 Essential (primary) hypertension: Secondary | ICD-10-CM | POA: Diagnosis not present

## 2015-10-24 DIAGNOSIS — S42202D Unspecified fracture of upper end of left humerus, subsequent encounter for fracture with routine healing: Secondary | ICD-10-CM | POA: Diagnosis not present

## 2015-10-25 DIAGNOSIS — I10 Essential (primary) hypertension: Secondary | ICD-10-CM | POA: Diagnosis not present

## 2015-10-25 DIAGNOSIS — G710 Muscular dystrophy: Secondary | ICD-10-CM | POA: Diagnosis not present

## 2015-10-25 DIAGNOSIS — S42202D Unspecified fracture of upper end of left humerus, subsequent encounter for fracture with routine healing: Secondary | ICD-10-CM | POA: Diagnosis not present

## 2015-10-25 DIAGNOSIS — M3320 Polymyositis, organ involvement unspecified: Secondary | ICD-10-CM | POA: Diagnosis not present

## 2015-10-27 DIAGNOSIS — M3320 Polymyositis, organ involvement unspecified: Secondary | ICD-10-CM | POA: Diagnosis not present

## 2015-10-27 DIAGNOSIS — G710 Muscular dystrophy: Secondary | ICD-10-CM | POA: Diagnosis not present

## 2015-10-27 DIAGNOSIS — I10 Essential (primary) hypertension: Secondary | ICD-10-CM | POA: Diagnosis not present

## 2015-10-27 DIAGNOSIS — S42202D Unspecified fracture of upper end of left humerus, subsequent encounter for fracture with routine healing: Secondary | ICD-10-CM | POA: Diagnosis not present

## 2015-10-28 DIAGNOSIS — S42202D Unspecified fracture of upper end of left humerus, subsequent encounter for fracture with routine healing: Secondary | ICD-10-CM | POA: Diagnosis not present

## 2015-10-28 DIAGNOSIS — M3320 Polymyositis, organ involvement unspecified: Secondary | ICD-10-CM | POA: Diagnosis not present

## 2015-10-28 DIAGNOSIS — G710 Muscular dystrophy: Secondary | ICD-10-CM | POA: Diagnosis not present

## 2015-10-28 DIAGNOSIS — I10 Essential (primary) hypertension: Secondary | ICD-10-CM | POA: Diagnosis not present

## 2015-11-04 DIAGNOSIS — S42295A Other nondisplaced fracture of upper end of left humerus, initial encounter for closed fracture: Secondary | ICD-10-CM | POA: Diagnosis not present

## 2015-11-08 DIAGNOSIS — M3320 Polymyositis, organ involvement unspecified: Secondary | ICD-10-CM | POA: Diagnosis not present

## 2015-11-08 DIAGNOSIS — I10 Essential (primary) hypertension: Secondary | ICD-10-CM | POA: Diagnosis not present

## 2015-11-08 DIAGNOSIS — S42202D Unspecified fracture of upper end of left humerus, subsequent encounter for fracture with routine healing: Secondary | ICD-10-CM | POA: Diagnosis not present

## 2015-11-08 DIAGNOSIS — G710 Muscular dystrophy: Secondary | ICD-10-CM | POA: Diagnosis not present

## 2015-11-11 DIAGNOSIS — I10 Essential (primary) hypertension: Secondary | ICD-10-CM | POA: Diagnosis not present

## 2015-11-11 DIAGNOSIS — M3320 Polymyositis, organ involvement unspecified: Secondary | ICD-10-CM | POA: Diagnosis not present

## 2015-11-11 DIAGNOSIS — S42202D Unspecified fracture of upper end of left humerus, subsequent encounter for fracture with routine healing: Secondary | ICD-10-CM | POA: Diagnosis not present

## 2015-11-11 DIAGNOSIS — G710 Muscular dystrophy: Secondary | ICD-10-CM | POA: Diagnosis not present

## 2015-11-11 DIAGNOSIS — M359 Systemic involvement of connective tissue, unspecified: Secondary | ICD-10-CM | POA: Diagnosis not present

## 2015-11-12 ENCOUNTER — Emergency Department (HOSPITAL_COMMUNITY): Payer: Medicare HMO

## 2015-11-12 ENCOUNTER — Inpatient Hospital Stay (HOSPITAL_COMMUNITY)
Admission: EM | Admit: 2015-11-12 | Discharge: 2015-11-15 | DRG: 193 | Disposition: A | Discharge status: Home Health | Payer: Medicare HMO | Attending: Internal Medicine | Admitting: Internal Medicine

## 2015-11-12 ENCOUNTER — Encounter (HOSPITAL_COMMUNITY): Payer: Self-pay | Admitting: Emergency Medicine

## 2015-11-12 DIAGNOSIS — Z8249 Family history of ischemic heart disease and other diseases of the circulatory system: Secondary | ICD-10-CM

## 2015-11-12 DIAGNOSIS — Z7952 Long term (current) use of systemic steroids: Secondary | ICD-10-CM

## 2015-11-12 DIAGNOSIS — J9601 Acute respiratory failure with hypoxia: Secondary | ICD-10-CM | POA: Diagnosis present

## 2015-11-12 DIAGNOSIS — R079 Chest pain, unspecified: Secondary | ICD-10-CM | POA: Diagnosis not present

## 2015-11-12 DIAGNOSIS — R0789 Other chest pain: Secondary | ICD-10-CM | POA: Diagnosis not present

## 2015-11-12 DIAGNOSIS — J1100 Influenza due to unidentified influenza virus with unspecified type of pneumonia: Principal | ICD-10-CM | POA: Diagnosis present

## 2015-11-12 DIAGNOSIS — M3320 Polymyositis, organ involvement unspecified: Secondary | ICD-10-CM | POA: Diagnosis not present

## 2015-11-12 DIAGNOSIS — Z79899 Other long term (current) drug therapy: Secondary | ICD-10-CM

## 2015-11-12 DIAGNOSIS — G710 Muscular dystrophy: Secondary | ICD-10-CM | POA: Diagnosis present

## 2015-11-12 DIAGNOSIS — R296 Repeated falls: Secondary | ICD-10-CM | POA: Diagnosis present

## 2015-11-12 DIAGNOSIS — I10 Essential (primary) hypertension: Secondary | ICD-10-CM | POA: Diagnosis not present

## 2015-11-12 DIAGNOSIS — Z833 Family history of diabetes mellitus: Secondary | ICD-10-CM | POA: Diagnosis not present

## 2015-11-12 DIAGNOSIS — J189 Pneumonia, unspecified organism: Secondary | ICD-10-CM

## 2015-11-12 DIAGNOSIS — Z87891 Personal history of nicotine dependence: Secondary | ICD-10-CM | POA: Diagnosis not present

## 2015-11-12 DIAGNOSIS — J111 Influenza due to unidentified influenza virus with other respiratory manifestations: Secondary | ICD-10-CM | POA: Diagnosis not present

## 2015-11-12 DIAGNOSIS — R0600 Dyspnea, unspecified: Secondary | ICD-10-CM | POA: Diagnosis not present

## 2015-11-12 DIAGNOSIS — Z803 Family history of malignant neoplasm of breast: Secondary | ICD-10-CM

## 2015-11-12 HISTORY — DX: Depression, unspecified: F32.A

## 2015-11-12 HISTORY — DX: Anemia, unspecified: D64.9

## 2015-11-12 HISTORY — DX: Unspecified osteoarthritis, unspecified site: M19.90

## 2015-11-12 HISTORY — DX: Major depressive disorder, single episode, unspecified: F32.9

## 2015-11-12 HISTORY — DX: Reserved for inherently not codable concepts without codable children: IMO0001

## 2015-11-12 HISTORY — DX: Pneumonia, unspecified organism: J18.9

## 2015-11-12 LAB — BLOOD GAS, VENOUS
Acid-base deficit: 0.7 mmol/L (ref 0.0–2.0)
Bicarbonate: 23.5 mEq/L (ref 20.0–24.0)
O2 Saturation: 79.1 %
Patient temperature: 98.6
TCO2: 21.3 mmol/L (ref 0–100)
pCO2, Ven: 38.9 mmHg — ABNORMAL LOW (ref 45.0–50.0)
pH, Ven: 7.398 — ABNORMAL HIGH (ref 7.250–7.300)
pO2, Ven: 45.7 mmHg — ABNORMAL HIGH (ref 31.0–45.0)

## 2015-11-12 LAB — CBC
HCT: 40.4 % (ref 36.0–46.0)
Hemoglobin: 12.6 g/dL (ref 12.0–15.0)
MCH: 27.7 pg (ref 26.0–34.0)
MCHC: 31.2 g/dL (ref 30.0–36.0)
MCV: 88.8 fL (ref 78.0–100.0)
Platelets: 270 10*3/uL (ref 150–400)
RBC: 4.55 MIL/uL (ref 3.87–5.11)
RDW: 17.5 % — ABNORMAL HIGH (ref 11.5–15.5)
WBC: 10.8 10*3/uL — ABNORMAL HIGH (ref 4.0–10.5)

## 2015-11-12 LAB — BASIC METABOLIC PANEL
Anion gap: 12 (ref 5–15)
BUN: 12 mg/dL (ref 6–20)
CO2: 26 mmol/L (ref 22–32)
Calcium: 9.1 mg/dL (ref 8.9–10.3)
Chloride: 98 mmol/L — ABNORMAL LOW (ref 101–111)
Creatinine, Ser: 0.45 mg/dL (ref 0.44–1.00)
GFR calc Af Amer: >60 mL/min (ref >60)
GFR calc non Af Amer: >60 mL/min (ref >60)
Glucose, Bld: 100 mg/dL — ABNORMAL HIGH (ref 65–99)
Potassium: 4.2 mmol/L (ref 3.5–5.1)
Sodium: 136 mmol/L (ref 135–145)

## 2015-11-12 LAB — HEPATIC FUNCTION PANEL
ALT: 34 U/L (ref 14–54)
AST: 40 U/L (ref 15–41)
Albumin: 4.1 g/dL (ref 3.5–5.0)
Alkaline Phosphatase: 80 U/L (ref 38–126)
Bilirubin, Direct: 0.1 mg/dL (ref 0.1–0.5)
Indirect Bilirubin: 0.6 mg/dL (ref 0.3–0.9)
Total Bilirubin: 0.7 mg/dL (ref 0.3–1.2)
Total Protein: 7.7 g/dL (ref 6.5–8.1)

## 2015-11-12 LAB — CK: Total CK: 536 U/L — ABNORMAL HIGH (ref 38–234)

## 2015-11-12 LAB — I-STAT TROPONIN, ED: Troponin i, poc: 0.08 ng/mL (ref 0.00–0.08)

## 2015-11-12 LAB — I-STAT CG4 LACTIC ACID, ED: Lactic Acid, Venous: 1.87 mmol/L (ref 0.5–2.0)

## 2015-11-12 LAB — CBG MONITORING, ED: Glucose-Capillary: 92 mg/dL (ref 65–99)

## 2015-11-12 MED ORDER — ACETAMINOPHEN 325 MG PO TABS
650.0000 mg | ORAL_TABLET | Freq: Once | ORAL | Status: AC | PRN
  Administered 2015-11-12: 650 mg via ORAL
  Filled 2015-11-12 (×2): qty 2

## 2015-11-12 MED ORDER — SODIUM CHLORIDE 0.9 % IV BOLUS (SEPSIS)
500.0000 mL | INTRAVENOUS | Status: AC
  Administered 2015-11-13: 500 mL via INTRAVENOUS

## 2015-11-12 MED ORDER — SODIUM CHLORIDE 0.9 % IV BOLUS (SEPSIS)
1000.0000 mL | INTRAVENOUS | Status: AC
  Administered 2015-11-12 (×2): 1000 mL via INTRAVENOUS

## 2015-11-12 MED ORDER — DEXTROSE 5 % IV SOLN
500.0000 mg | Freq: Once | INTRAVENOUS | Status: AC
  Administered 2015-11-12: 500 mg via INTRAVENOUS
  Filled 2015-11-12: qty 500

## 2015-11-12 MED ORDER — METHYLPREDNISOLONE SODIUM SUCC 125 MG IJ SOLR
125.0000 mg | Freq: Once | INTRAMUSCULAR | Status: AC
  Administered 2015-11-12: 125 mg via INTRAVENOUS
  Filled 2015-11-12: qty 2

## 2015-11-12 MED ORDER — DEXTROSE 5 % IV SOLN
1.0000 g | Freq: Once | INTRAVENOUS | Status: AC
  Administered 2015-11-12: 1 g via INTRAVENOUS
  Filled 2015-11-12: qty 10

## 2015-11-12 NOTE — ED Notes (Addendum)
Notified EDP,Rees,MD., pt. i-stat Troponin results 0.08 and RN,Christina.

## 2015-11-12 NOTE — ED Provider Notes (Signed)
CSN: [id number]     Arrival date & time 11/12/15  1957 History   First MD Initiated Contact with Patient 11/12/15 2049     Chief Complaint  Patient presents with  . Cough  . Nausea  . Chest Pain  . Shortness of Breath     The history is provided by the patient. No language interpreter was used.   SHENELLE HAGER is a 55 y.o. female who presents to the Emergency Department complaining of cough, sob.  Sxs started last night with dry cough and mild increased SOB.  Today her SOB significantly worsened and she has some central chest discomfort and right sided headache.  No known fevers at home.  She felt poorly today and has not taken any of her home medications.  Symptoms are severe, constant, worsening.  Past Medical History  Diagnosis Date  . Hypertension   . Polymyositis ([place])   . MD (muscular dystrophy) [facility])    Past Surgical History  Procedure Laterality Date  . Endometrial ablation    . Cesarean section    . Leep     Family History  Problem Relation Age of Onset  . Hypertension Father   . Heart disease Father   . Hypertension Mother   . Diabetes Maternal Aunt   . Breast cancer Maternal Aunt    Social History  Substance Use Topics  . Smoking status: Former Smoker    Quit date: 12/25/1988  . Smokeless tobacco: Never Used  . Alcohol Use: No   OB History    Gravida Para Term Preterm AB TAB SAB Ectopic Multiple Living   1 1             Review of Systems  All other systems reviewed and are negative.     Allergies  Review of patient's allergies indicates no known allergies.  Home Medications   Prior to Admission medications   Medication Sig Start Date End Date Taking? Authorizing Provider  alendronate (FOSAMAX) 70 MG tablet Take 70 mg by mouth every 7 (seven) days. Take with a full glass of water on an empty stomach.   Yes Historical Provider, MD  Calcium-Vitamin D-Vitamin K (VIACTIV PO) Take 2 Units by mouth daily.   Yes Historical Provider, MD   ergocalciferol (VITAMIN D2) 50000 UNITS capsule Take 50,000 Units by mouth once a week.   Yes Historical Provider, MD  folic acid (FOLVITE) 1 MG tablet Take 1 mg by mouth daily.   Yes Historical Provider, MD  methotrexate (RHEUMATREX) 2.5 MG tablet Take 10 mg by mouth once a week. Caution:Chemotherapy. Protect from light.   Yes Historical Provider, MD  ondansetron (ZOFRAN) 4 MG tablet Take 1 tablet (4 mg total) by mouth every 8 (eight) hours as needed for nausea or vomiting. 10/12/15  Yes Margarita Mail, PA-C  oxyCODONE-acetaminophen (PERCOCET) 5-325 MG tablet Take 1-2 tablets by mouth every 4 (four) hours as needed. Patient taking differently: Take 1-2 tablets by mouth every 4 (four) hours as needed for moderate pain or severe pain.  10/12/15  Yes Margarita Mail, PA-C  predniSONE (DELTASONE) 5 MG tablet Take 10 mg by mouth daily.    Yes Historical Provider, MD  ranitidine (ZANTAC) 150 MG tablet Take 150 mg by mouth 2 (two) times daily as needed for heartburn.    Yes Historical Provider, MD  telmisartan-hydrochlorothiazide (MICARDIS HCT) 80-25 MG tablet Take 1 tablet by mouth daily.   Yes Historical Provider, MD   BP 131/95 mmHg  Pulse 99  Temp(Src)  102.8 F (39.3 C) (Oral)  Resp 35  SpO2 99% Physical Exam  Constitutional: She is oriented to person, place, and time. She appears well-developed and well-nourished.  HENT:  Head: Normocephalic and atraumatic.  Cardiovascular: Regular rhythm.   No murmur heard. tachycardic  Pulmonary/Chest: She is in respiratory distress.  Rhonchi bilaterally, left greater than right  Abdominal: Soft. There is no tenderness. There is no rebound and no guarding.  Musculoskeletal: She exhibits no edema or tenderness.  Neurological: She is alert and oriented to person, place, and time.  Generalized weakness, greatest in the lower extremities.   Skin: Skin is warm and dry.  Psychiatric: She has a normal mood and affect. Her behavior is normal.  Nursing note and  vitals reviewed.   ED Course  Procedures (including critical care time) Labs Review Labs Reviewed  BASIC METABOLIC PANEL - Abnormal; Notable for the following:    Chloride 98 (*)    Glucose, Bld 100 (*)    All other components within normal limits  CBC - Abnormal; Notable for the following:    WBC 10.8 (*)    RDW 17.5 (*)    All other components within normal limits  CK - Abnormal; Notable for the following:    Total CK 536 (*)    All other components within normal limits  BLOOD GAS, VENOUS - Abnormal; Notable for the following:    pH, Ven 7.398 (*)    pCO2, Ven 38.9 (*)    pO2, Ven 45.7 (*)    All other components within normal limits  CULTURE, BLOOD (ROUTINE X 2)  CULTURE, BLOOD (ROUTINE X 2)  URINE CULTURE  HEPATIC FUNCTION PANEL  URINALYSIS, ROUTINE W REFLEX MICROSCOPIC (NOT AT [facility])  INFLUENZA PANEL BY PCR (TYPE A & B, H1N1)  I-STAT CG4 LACTIC ACID, ED  I-STAT TROPOININ, ED  I-STAT VENOUS BLOOD GAS, ED  CBG MONITORING, ED  I-STAT CG4 LACTIC ACID, ED    Imaging Review Dg Chest Port 1 View  11/12/2015  CLINICAL DATA:  Chest pain for several hours EXAM: PORTABLE CHEST 1 VIEW COMPARISON:  04/17/2015 FINDINGS: Cardiac shadow is stable in appearance. The overall inspiratory effort is poor with crowding of the vascular markings although no focal infiltrate or sizable effusion is seen. No bony abnormality is noted. IMPRESSION: Poor inspiratory effort without acute abnormality. Electronically Signed   By: Inez Catalina M.D.   On: 11/12/2015 21:41   I have personally reviewed and evaluated these images and lab results as part of my medical decision-making.   EKG Interpretation   Date/Time:  Saturday November 12 2015 20:40:07 EDT Ventricular Rate:  126 PR Interval:  123 QRS Duration: 75 QT Interval:  291 QTC Calculation: 421 R Axis:   -25 Text Interpretation:  Sinus tachycardia Paired ventricular premature  complexes Aberrant complex Left ventricular hypertrophy Inferior  infarct,  old Anterior Q waves, possibly due to LVH Baseline wander in lead(s) V2  Confirmed by Hazle Coca [phone]) on 11/12/2015 8:49:44 PM      MDM   Final diagnoses:  Acute respiratory failure with hypoxia [facility])   Patient here for fever, shortness breath, cough. She is very ill appearing on initial evaluation with respiratory distress and generalized weakness, hypoxia on ED arrival. On repeat evaluation following Tylenol, IV fluids, nasal cannula oxygen she appears significantly improved. She did miss her chronic dose of steroids today, provided Solu-Medrol for potential adrenal insufficiency. Treating for community-acquired pneumonia given her history, presentation. Plan to admit to the medicine  service for further treatment. Discussed with patient findings of studies and critical nature of illness and patient is in agreement with plan.  Quintella Reichert, MD 11/12/15 [phone]

## 2015-11-12 NOTE — ED Notes (Signed)
Notified EDP,Rees,MD., pt. i-stat CG4 Lactic acid results 1.87.

## 2015-11-12 NOTE — ED Notes (Signed)
Bed: WA17 Expected date:  Expected time:  Means of arrival:  Comments: Triage 3 

## 2015-11-12 NOTE — H&P (Signed)
History and Physical  Patient Name: Jessica Burke     [id number]    DOB: 1960/10/10    DOA: 11/12/2015 Referring physician: Quintella Reichert, MD PCP: Jani Gravel, MD      Chief Complaint: Cough and dyspnea  HPI: Jessica Burke is a 55 y.o. female with a past medical history significant for polymyositis who presents with cough and dyspnea.  The patient was in her usual state of health until around the last 2-3 days when she started developing symptoms of URI with cough, chest congestion, coryza.  Today, this abruptly worsened, and she felt that she couldn't catch her breath, and further she developed a fever, so she came to the ER.  In the ED, she was febrile, hypoxic, tachycardic, and tachypneic.  CXR without opacity.  Na 136, K 4.2, Cr 0.4, WBC 10.8K, Hgb 12, CK pending, TNI negative, lactate normal.  There was concern for need to intubate initially in the ER, but she did not require this or intubation and stabilized with supplemental oxygen.  Antibiotics and fluids were admininstered after collection of culture data per CODE sepsis protocol, and TRH were asked to evaluate for admission.     Review of Systems:  All other systems negative except as just noted or noted in the history of present illness.  No Known Allergies  Prior to Admission medications   Medication Sig Start Date End Date Taking? Authorizing Provider  alendronate (FOSAMAX) 70 MG tablet Take 70 mg by mouth every 7 (seven) days. Take with a full glass of water on an empty stomach.   Yes Historical Provider, MD  Calcium-Vitamin D-Vitamin K (VIACTIV PO) Take 2 Units by mouth daily.   Yes Historical Provider, MD  ergocalciferol (VITAMIN D2) 50000 UNITS capsule Take 50,000 Units by mouth once a week.   Yes Historical Provider, MD  folic acid (FOLVITE) 1 MG tablet Take 1 mg by mouth daily.   Yes Historical Provider, MD  methotrexate (RHEUMATREX) 2.5 MG tablet Take 10 mg by mouth once a week. Caution:Chemotherapy.  Protect from light.   Yes Historical Provider, MD  ondansetron (ZOFRAN) 4 MG tablet Take 1 tablet (4 mg total) by mouth every 8 (eight) hours as needed for nausea or vomiting. 10/12/15  Yes Margarita Mail, PA-C  oxyCODONE-acetaminophen (PERCOCET) 5-325 MG tablet Take 1-2 tablets by mouth every 4 (four) hours as needed. Patient taking differently: Take 1-2 tablets by mouth every 4 (four) hours as needed for moderate pain or severe pain.  10/12/15  Yes Margarita Mail, PA-C  predniSONE (DELTASONE) 5 MG tablet Take 10 mg by mouth daily.    Yes Historical Provider, MD  ranitidine (ZANTAC) 150 MG tablet Take 150 mg by mouth 2 (two) times daily as needed for heartburn.    Yes Historical Provider, MD  telmisartan-hydrochlorothiazide (MICARDIS HCT) 80-25 MG tablet Take 1 tablet by mouth daily.   Yes Historical Provider, MD    Past Medical History  Diagnosis Date  . Hypertension   . Polymyositis ([place])   . MD (muscular dystrophy) [facility])     Past Surgical History  Procedure Laterality Date  . Endometrial ablation    . Cesarean section    . Leep      Family history: family history includes Breast cancer in her maternal aunt; Diabetes in her maternal aunt; Heart disease in her father; Hypertension in her father and mother.  Social History: Patient lives with her husband.  She does not smoke.  She is unable to rise from  a chair without assistance, and uses a cane or a walker exclusively.  She has SOB at baseline but swallowing is okay.       Physical Exam: BP 105/75 mmHg  Pulse 100  Temp(Src) 102.8 F (39.3 C) (Oral)  Resp 31  SpO2 100% General appearance: Well-developed, adult female, awake, but ill appearing and in moderate distress from dyspnea.   Eyes: Anicteric, conjunctiva pink, lids and lashes normal.     ENT: No nasal deformity, discharge, or epistaxis.  OP moist without lesions.   Lymph: No cervical or supraclavicular lymphadenopathy. Skin: Warm and moist, and flushed. Cardiac:  Tachycardic, regular, nl S1-S2, no murmurs appreciated.  Capillary refill is brisk.  No JVD, no LE edema.  Radial and DP pulses 2+ and symmetric. Respiratory: Tachypnea.  Poor inspiratory effort.  Expiratory wheezes diffusely.  Rales, bilateral bases, worse on R. Abdomen: Abdomen soft without rigidity.  No TTP. No ascites, distension.   MSK: No deformities or effusions. Neuro: Sensorium intact and responding to questions, attention normal.  Oriented to person, place and situation.  Speech is fluent.  The left arm she cannot lift for pain.  The right arm has proximal weakness.  She is globally weak, and has trouble lifting her head and repositioning in bed.    Psych: Behavior appropriate.  Affect normal.  No evidence of aural or visual hallucinations or delusions.       Labs on Admission:  The metabolic panel shows normal electrolytes and renal function. CK pending.  UA pending. TNI negative. Lactate normal. The complete blood count shows mild leukocytosis without anemia or thrombocytopenia.   Radiological Exams on Admission: Personally reviewed: Dg Chest Port 1 View  11/12/2015  CLINICAL DATA:  Chest pain for several hours EXAM: PORTABLE CHEST 1 VIEW COMPARISON:  04/17/2015 FINDINGS: Cardiac shadow is stable in appearance. The overall inspiratory effort is poor with crowding of the vascular markings although no focal infiltrate or sizable effusion is seen. No bony abnormality is noted. IMPRESSION: Poor inspiratory effort without acute abnormality. Electronically Signed   By: Inez Catalina M.D.   On: 11/12/2015 21:41    EKG: Independently reviewed. Sinus, rate 126, QTc 421.  LVH.      Assessment/Plan 1. Acute hypoxic respiratory failure, possible sepsis:  This appears to be a respiratory infection, viral vs CAP, superimposed on baseline poor respiratory function from #2.  No urinary symptoms.   Possible sepsis, source lung, organism unknown.  Meets criteria given fever, tachycardia,  tachypnea.  Lactate and BP normal.  qSOFA 1 for respiratory rate.   -Follow influenza panel -Ceftriaxone and azithromycin empirically for CAP -Follow blood and urine cultures -Procalcitonin not available given prednisone -NIF testing -ABG and BiPAP if worsening respiratory status -Albuterol PRN for wheezing   2. Polymyositis:  This has progressed, and patient is now unable to climb stairs, get out of a chair unassisted, or walk without cane/walker.  She has baseline SOB, much worse in the context of the present illness. -Continue methotrexate -Hold prednisone and stress dose with HC 50 q8hrs for now  3. HTN:  -Hold home antihypertensives for now  4. Chest pain:  The patient is clear that she has mild chest discomfort, in contrast to nursing triage note, mostly vague tightness from respiratory effort. -Repeat troponin once      DVT PPx: Lovenox Diet: Regular Consultants: None Code Status: FULL Family Communication: Husband, present at bedside  Medical decision making: What exists of the patient's previous chart was reviewed in depth  and the case was discussed with Dr. Ralene Bathe. Patient seen 11:20 PM on 11/12/2015.  Disposition Plan:  I recommend admission to stepdown, inaptient status.  Clinical condition: guarded, given BP at presentation, tachypnea, hypoxia and underlying myositis.  Anticipate stepdown treatment with antibiotics and fluids tonight, possible respiratory support, transfer to floor when respiratory status stable, and dsicharge within 4-5 days.      Edwin Dada [facility] Pager [phone]

## 2015-11-12 NOTE — ED Notes (Signed)
Patient here from home with complaints of central chest pain that started at 2pm today. Cough, dry heaving x2 days. Pain 10/10.

## 2015-11-13 ENCOUNTER — Encounter (HOSPITAL_COMMUNITY): Payer: Self-pay | Admitting: Family Medicine

## 2015-11-13 DIAGNOSIS — J189 Pneumonia, unspecified organism: Secondary | ICD-10-CM | POA: Insufficient documentation

## 2015-11-13 LAB — COMPREHENSIVE METABOLIC PANEL
ALT: 30 U/L (ref 14–54)
AST: 34 U/L (ref 15–41)
Albumin: 3.1 g/dL — ABNORMAL LOW (ref 3.5–5.0)
Alkaline Phosphatase: 67 U/L (ref 38–126)
Anion gap: 9 (ref 5–15)
BUN: 9 mg/dL (ref 6–20)
CO2: 23 mmol/L (ref 22–32)
Calcium: 8 mg/dL — ABNORMAL LOW (ref 8.9–10.3)
Chloride: 109 mmol/L (ref 101–111)
Creatinine, Ser: <0.3 mg/dL — ABNORMAL LOW (ref 0.44–1.00)
Glucose, Bld: 115 mg/dL — ABNORMAL HIGH (ref 65–99)
Potassium: 4 mmol/L (ref 3.5–5.1)
Sodium: 141 mmol/L (ref 135–145)
Total Bilirubin: 0.6 mg/dL (ref 0.3–1.2)
Total Protein: 6.2 g/dL — ABNORMAL LOW (ref 6.5–8.1)

## 2015-11-13 LAB — MRSA PCR SCREENING: MRSA by PCR: NEGATIVE

## 2015-11-13 LAB — URINALYSIS, ROUTINE W REFLEX MICROSCOPIC
Bilirubin Urine: NEGATIVE
Glucose, UA: NEGATIVE mg/dL
Hgb urine dipstick: NEGATIVE
Ketones, ur: NEGATIVE mg/dL
Leukocytes, UA: NEGATIVE
Nitrite: NEGATIVE
Protein, ur: NEGATIVE mg/dL
Specific Gravity, Urine: 1.002 — ABNORMAL LOW (ref 1.005–1.030)
pH: 7 (ref 5.0–8.0)

## 2015-11-13 LAB — CBC
HCT: 40.5 % (ref 36.0–46.0)
Hemoglobin: 11.9 g/dL — ABNORMAL LOW (ref 12.0–15.0)
MCH: 27.4 pg (ref 26.0–34.0)
MCHC: 29.4 g/dL — ABNORMAL LOW (ref 30.0–36.0)
MCV: 93.3 fL (ref 78.0–100.0)
Platelets: 183 10*3/uL (ref 150–400)
RBC: 4.34 MIL/uL (ref 3.87–5.11)
RDW: 17.6 % — ABNORMAL HIGH (ref 11.5–15.5)
WBC: 6.9 10*3/uL (ref 4.0–10.5)

## 2015-11-13 LAB — CREATININE, SERUM: Creatinine, Ser: <0.3 mg/dL — ABNORMAL LOW (ref 0.44–1.00)

## 2015-11-13 LAB — INFLUENZA PANEL BY PCR (TYPE A & B)
H1N1 flu by pcr: NOT DETECTED
Influenza A By PCR: NEGATIVE
Influenza B By PCR: POSITIVE — AB

## 2015-11-13 LAB — TROPONIN I: Troponin I: 0.07 ng/mL — ABNORMAL HIGH (ref <0.031)

## 2015-11-13 LAB — I-STAT CG4 LACTIC ACID, ED: Lactic Acid, Venous: 0.35 mmol/L — ABNORMAL LOW (ref 0.5–2.0)

## 2015-11-13 MED ORDER — FOLIC ACID 1 MG PO TABS
1.0000 mg | ORAL_TABLET | Freq: Every day | ORAL | Status: DC
  Administered 2015-11-13 – 2015-11-15 (×3): 1 mg via ORAL
  Filled 2015-11-13 (×3): qty 1

## 2015-11-13 MED ORDER — SODIUM CHLORIDE 0.9% FLUSH
3.0000 mL | Freq: Two times a day (BID) | INTRAVENOUS | Status: DC
  Administered 2015-11-13 – 2015-11-14 (×5): 3 mL via INTRAVENOUS

## 2015-11-13 MED ORDER — METHOTREXATE 2.5 MG PO TABS: 10.0000 mg | ORAL_TABLET | ORAL | Status: DC

## 2015-11-13 MED ORDER — ACETAMINOPHEN 325 MG PO TABS: 650.0000 mg | ORAL_TABLET | Freq: Four times a day (QID) | ORAL | Status: DC | PRN

## 2015-11-13 MED ORDER — ONDANSETRON HCL 4 MG/2ML IJ SOLN: 4.0000 mg | Freq: Four times a day (QID) | INTRAMUSCULAR | Status: DC | PRN

## 2015-11-13 MED ORDER — ACETAMINOPHEN 650 MG RE SUPP
650.0000 mg | Freq: Four times a day (QID) | RECTAL | Status: DC | PRN
Start: 2015-11-13 — End: 2015-11-15

## 2015-11-13 MED ORDER — CETYLPYRIDINIUM CHLORIDE 0.05 % MT LIQD
7.0000 mL | Freq: Two times a day (BID) | OROMUCOSAL | Status: DC
  Administered 2015-11-13 – 2015-11-14 (×4): 7 mL via OROMUCOSAL

## 2015-11-13 MED ORDER — DEXTROSE 5 % IV SOLN
2.0000 g | INTRAVENOUS | Status: DC
  Filled 2015-11-13: qty 2

## 2015-11-13 MED ORDER — ALBUTEROL SULFATE (2.5 MG/3ML) 0.083% IN NEBU: 2.5000 mg | INHALATION_SOLUTION | RESPIRATORY_TRACT | Status: DC | PRN

## 2015-11-13 MED ORDER — OSELTAMIVIR PHOSPHATE 75 MG PO CAPS
75.0000 mg | ORAL_CAPSULE | Freq: Two times a day (BID) | ORAL | Status: DC
  Administered 2015-11-13 – 2015-11-15 (×5): 75 mg via ORAL
  Filled 2015-11-13 (×7): qty 1

## 2015-11-13 MED ORDER — CHLORHEXIDINE GLUCONATE 0.12 % MT SOLN
15.0000 mL | Freq: Two times a day (BID) | OROMUCOSAL | Status: DC
  Administered 2015-11-13 – 2015-11-15 (×5): 15 mL via OROMUCOSAL
  Filled 2015-11-13 (×5): qty 15

## 2015-11-13 MED ORDER — ONDANSETRON HCL 4 MG PO TABS: 4.0000 mg | ORAL_TABLET | Freq: Four times a day (QID) | ORAL | Status: DC | PRN

## 2015-11-13 MED ORDER — ONDANSETRON HCL 4 MG PO TABS: 4.0000 mg | ORAL_TABLET | Freq: Three times a day (TID) | ORAL | Status: DC | PRN

## 2015-11-13 MED ORDER — OXYCODONE-ACETAMINOPHEN 5-325 MG PO TABS: 1.0000 | ORAL_TABLET | ORAL | Status: DC | PRN

## 2015-11-13 MED ORDER — ENOXAPARIN SODIUM 40 MG/0.4ML ~~LOC~~ SOLN
40.0000 mg | SUBCUTANEOUS | Status: DC
  Administered 2015-11-13 – 2015-11-15 (×3): 40 mg via SUBCUTANEOUS
  Filled 2015-11-13 (×3): qty 0.4

## 2015-11-13 MED ORDER — DEXTROSE 5 % IV SOLN: 500.0000 mg | INTRAVENOUS | Status: DC

## 2015-11-13 MED ORDER — HYDROCORTISONE NA SUCCINATE PF 100 MG IJ SOLR
50.0000 mg | Freq: Three times a day (TID) | INTRAMUSCULAR | Status: DC
  Administered 2015-11-13 – 2015-11-15 (×7): 50 mg via INTRAVENOUS
  Filled 2015-11-13 (×7): qty 2

## 2015-11-13 NOTE — Progress Notes (Signed)
[facility] PROGRESS NOTE  Jessica Burke [id number] DOB: 05-25-1961 DOA: 11/12/2015 PCP: Jani Gravel, MD  Assessment/Plan: 1. Possible community acquired pneumonia versus influenza -Jessica. Jessica Burke presenting to the emergency department with complaints of shortness of breath sounds have a temperature of 102.8 on presentation. Initial x-ray did not reveal acute infiltrate. -She was started on empiric IV antibiotic therapy with ceftriaxone and azithromycin. -Flu swab pending at the time of this dictation. -Plan to treat empirically with Tamiflu until flu swab is available -On my evaluation she appears hemodynamically stable, will transfer to telemetry later on today and she remained stable.  2.  Polymyositis, biopsy-proven -She currently follows Dr. Jannifer Franklin of neurology, and his family being treated with methotrexate and prednisone. -At baseline he requires a walker to get around however lately has been having recurrent falls. -We'll consult physical therapy.  Code Status: Full code Family Communication: Family not present Disposition Plan: Transfer the patient will require greater than 2 nights hospitalization   Antibiotics:  Azithromycin  Ceftriaxone  Tamiflu  HPI/Subjective: Jessica Burke is a 55 year old female with a past medical history of polymyositis currently following Dr.WIllis of neurology, on chronic methotrexate and prednisone. Presented to the emergency department with complaints of shortness of breath. At baseline patient reporting utilizing a walker to get around however lately she has become increasingly weak and has had several falls. On presentation she had a temperature 102.8 with a respiratory rate of 35. Chest x-ray did not reveal acute infiltrate.  Objective: Filed Vitals:   11/13/15 0145 11/13/15 0200  BP: 105/74 106/81  Pulse: 91 82  Temp:    Resp: 20 17    Intake/Output Summary (Last 24 hours) at 11/13/15 0551 Last data filed at 11/13/15  0539  Gross per 24 hour  Intake     10 ml  Output      0 ml  Net     10 ml   Filed Weights   11/13/15 0423  Weight: 82.1 kg (181 lb)    Exam:   General:  No acute distress, reports feeling better on supplemental oxygen  Cardiovascular: Regular rate rhythm normal S1-S2  Respiratory: Few bibasilar crackles, normal respiratory effort, good air movement no wheezing or crackles or rales  Abdomen: Soft nontender nondistended  Musculoskeletal: No edema  Data Reviewed: Basic Metabolic Panel:  Recent Labs Lab 11/12/15 2056 11/13/15 0357 11/13/15 0359  NA 136  --  141  K 4.2  --  4.0  CL 98*  --  109  CO2 26  --  23  GLUCOSE 100*  --  115*  BUN 12  --  9  CREATININE 0.45 <0.30* <0.30*  CALCIUM 9.1  --  8.0*   Liver Function Tests:  Recent Labs Lab 11/12/15 2056 11/13/15 0359  AST 40 34  ALT 34 30  ALKPHOS 80 67  BILITOT 0.7 0.6  PROT 7.7 6.2*  ALBUMIN 4.1 3.1*   No results for input(s): LIPASE, AMYLASE in the last 168 hours. No results for input(s): AMMONIA in the last 168 hours. CBC:  Recent Labs Lab 11/12/15 2056 11/13/15 0357  WBC 10.8* 6.9  HGB 12.6 11.9*  HCT 40.4 40.5  MCV 88.8 93.3  PLT 270 183   Cardiac Enzymes:  Recent Labs Lab 11/12/15 2056 11/13/15 0357  CKTOTAL 536*  --   TROPONINI  --  0.07*   BNP (last 3 results) No results for input(s): BNP in the last 8760 hours.  ProBNP (last 3 results) No results for input(s):  PROBNP in the last 8760 hours.  CBG:  Recent Labs Lab 11/12/15 2122  GLUCAP 92    Recent Results (from the past 240 hour(s))  MRSA PCR Screening     Status: None   Collection Time: 11/13/15  3:16 AM  Result Value Ref Range Status   MRSA by PCR NEGATIVE NEGATIVE Final    Comment:        The GeneXpert MRSA Assay (FDA approved for NASAL specimens only), is one component of a comprehensive MRSA colonization surveillance program. It is not intended to diagnose MRSA infection nor to guide or monitor  treatment for MRSA infections.      Studies: Dg Chest Port 1 View  11/12/2015  CLINICAL DATA:  Chest pain for several hours EXAM: PORTABLE CHEST 1 VIEW COMPARISON:  04/17/2015 FINDINGS: Cardiac shadow is stable in appearance. The overall inspiratory effort is poor with crowding of the vascular markings although no focal infiltrate or sizable effusion is seen. No bony abnormality is noted. IMPRESSION: Poor inspiratory effort without acute abnormality. Electronically Signed   By: Inez Catalina M.D.   On: 11/12/2015 21:41    Scheduled Meds: . antiseptic oral rinse  7 mL Mouth Rinse q12n4p  . azithromycin  500 mg Intravenous Q24H  . cefTRIAXone (ROCEPHIN)  IV  2 g Intravenous Q24H  . chlorhexidine  15 mL Mouth Rinse BID  . enoxaparin (LOVENOX) injection  40 mg Subcutaneous Q24H  . folic acid  1 mg Oral Daily  . hydrocortisone sod succinate (SOLU-CORTEF) inj  50 mg Intravenous Q8H  . methotrexate  10 mg Oral Weekly  . sodium chloride flush  3 mL Intravenous Q12H   Continuous Infusions:   Principal Problem:   Acute hypoxemic respiratory failure (HCC) Active Problems:   HTN (hypertension)   Polymyositis ([place])    Time spent:     Kelvin Cellar  [facility] Pager [phone]. If 7PM-7AM, please contact night-coverage at [email or web address], password [facility] 11/13/2015, 5:51 AM  LOS: 1 day

## 2015-11-13 NOTE — Progress Notes (Signed)
MEDICATION RELATED CONSULT NOTE - INITIAL   Pharmacy Consult for Methotrexate Indication: polymyositis   No Known Allergies  Patient Measurements: Height: 5\' 3"  (160 cm) Weight: 181 lb (82.1 kg) IBW/kg (Calculated) : 52.4 Adjusted Body Weight:   Vital Signs: Temp: 97.6 F (36.4 C) (03/19 0423) Temp Source: Oral (03/19 0423) BP: 106/81 mmHg (03/19 0200) Pulse Rate: 82 (03/19 0200) Intake/Output from previous day: 03/18 0701 - 03/19 0700 In: 10 [I.V.:10] Out: -  Intake/Output from this shift: Total I/O In: 10 [I.V.:10] Out: -   Labs:  Recent Labs  11/12/15 2056 11/13/15 0357 11/13/15 0359  WBC 10.8* 6.9  --   HGB 12.6 11.9*  --   HCT 40.4 40.5  --   PLT 270 183  --   CREATININE 0.45 <0.30* <0.30*  ALBUMIN 4.1  --  3.1*  PROT 7.7  --  6.2*  AST 40  --  34  ALT 34  --  30  ALKPHOS 80  --  67  BILITOT 0.7  --  0.6  BILIDIR 0.1  --   --   IBILI 0.6  --   --    CrCl cannot be calculated (Patient has no serum creatinine result on file.).   Microbiology: Recent Results (from the past 720 hour(s))  MRSA PCR Screening     Status: None   Collection Time: 11/13/15  3:16 AM  Result Value Ref Range Status   MRSA by PCR NEGATIVE NEGATIVE Final    Comment:        The GeneXpert MRSA Assay (FDA approved for NASAL specimens only), is one component of a comprehensive MRSA colonization surveillance program. It is not intended to diagnose MRSA infection nor to guide or monitor treatment for MRSA infections.     Medical History: Past Medical History  Diagnosis Date  . Hypertension   . Polymyositis ([place])   . 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[place])   . Shortness of breath dyspnea   . Pneumonia     2016  . Depression     2003  . Arthritis   . Anemia     Medications:  Prescriptions prior to admission  Medication Sig Dispense Refill Last Dose  . alendronate (FOSAMAX) 70 MG tablet Take 70 mg by mouth every 7 (seven) days. Take with a full glass of water on an empty  stomach.   11/06/2015  . Calcium-Vitamin D-Vitamin K (VIACTIV PO) Take 2 Units by mouth daily.   11/11/2015 at Unknown time  . ergocalciferol (VITAMIN D2) 50000 UNITS capsule Take 50,000 Units by mouth once a week.   [id number]  . folic acid (FOLVITE) 1 MG tablet Take 1 mg by mouth daily.   11/11/2015 at Unknown time  . methotrexate (RHEUMATREX) 2.5 MG tablet Take 10 mg by mouth once a week. Caution:Chemotherapy. Protect from light.   11/06/2015  . ondansetron (ZOFRAN) 4 MG tablet Take 1 tablet (4 mg total) by mouth every 8 (eight) hours as needed for nausea or vomiting. 10 tablet 0 11/12/2015 at Unknown time  . oxyCODONE-acetaminophen (PERCOCET) 5-325 MG tablet Take 1-2 tablets by mouth every 4 (four) hours as needed. (Patient taking differently: Take 1-2 tablets by mouth every 4 (four) hours as needed for moderate pain or severe pain. ) 20 tablet 0 11/12/2015 at Unknown time  . predniSONE (DELTASONE) 5 MG tablet Take 10 mg by mouth daily.    11/11/2015 at Unknown time  . ranitidine (ZANTAC) 150 MG tablet Take 150 mg by mouth 2 (two) times  daily as needed for heartburn.    Past Week at Unknown time  . telmisartan-hydrochlorothiazide (MICARDIS HCT) 80-25 MG tablet Take 1 tablet by mouth daily.   11/11/2015 at Unknown time    Assessment: Patient on methotrexate PTA.  However, patient with noted active infection.  Goal of Therapy:  Safe use of methotrexate  Plan:  D/C methotrexate at this time.  Tyler Deis, Shea Stakes [place] 11/13/2015,6:54 AM

## 2015-11-13 NOTE — Progress Notes (Signed)
NIF -32. Best of several attempts. Complications obtaining accurate results due to nasal leakage and patient poor effort. Once able to maintain tight seal, occlude nares and give adequate effort; results are within normal range. Productive cough noted between attempts. Tolerated well. RT will continue to follow.

## 2015-11-13 NOTE — Progress Notes (Signed)
PHARMACY NOTE -   Pharmacy has been assisting with dosing of Rocephin/Zithromax for CAP. Dosage remains stable and need for further dosage adjustment appears unlikely at present.    Will sign off at this time.  Please reconsult if a change in clinical status warrants re-evaluation of dosage.  Netta Cedars, PharmD, BCPS Pager: [phone] 11/13/2015@9 :08 AM

## 2015-11-13 NOTE — Progress Notes (Signed)
Utilization Review Completed.Patti Shorb T3/19/2017  

## 2015-11-13 NOTE — Progress Notes (Signed)
Chaplain the result of a Spiritual Consult in [place]. Ms Kostiuk is discouraged about seemingly always having medical problems. Her spiritual pain is centered in her requests for God to heal her of those illnesses that can be healed. In this vein, the discussion included working hard to prevent health problems through wise and holy living. She reports that her immune system is less effective than others, so preventing contact with people who have the flu when possible, or at least not allowing vanity to keep from wearing a mask when in contact with those with flu, is wise and holy living. Prayers for her husband, who is her caregiver, and those in the hospital who are caring for her were voiced.   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 Her world view and her decision making is keyed by both logic and her faith traditions. It is important to allow her both when being asked to render a medical decision for herself.  Ms Rudloff would be helped physically, mentally and spiritually by further visits by chaplains.  The [place] for this patient as been marked completed.  Sallee Lange. Shekita Boyden, DMin, MDiv Chaplain

## 2015-11-13 NOTE — Progress Notes (Signed)
Pharmacy Antibiotic Note  GALE ANDREASSEN is a 55 y.o. female admitted on 11/12/2015 with pneumonia.  Pharmacy has been consulted for Azithromycin, ceftriaxone dosing.  Plan: Ceftriaxone 2gm iv q24hr Azithromycin 500mg  iv q24hr  Height: 5\' 3"  (160 cm) Weight: 181 lb (82.1 kg) IBW/kg (Calculated) : 52.4  Temp (24hrs), Avg:100.2 F (37.9 C), Min:97.6 F (36.4 C), Max:102.8 F (39.3 C)   Recent Labs Lab 11/12/15 2056 11/12/15 2123 11/13/15 0009 11/13/15 0357 11/13/15 0359  WBC 10.8*  --   --  6.9  --   CREATININE 0.45  --   --  <0.30* <0.30*  LATICACIDVEN  --  1.87 0.35*  --   --     CrCl cannot be calculated (Patient has no serum creatinine result on file.).    No Known Allergies  Antimicrobials this admission: Azithromycin 3/18 >>  Ceftriaxone 3/18 >>    Thank you for allowing pharmacy to be a part of this patient's care.  Nani Skillern Crowford 11/13/2015 6:58 AM

## 2015-11-14 DIAGNOSIS — J111 Influenza due to unidentified influenza virus with other respiratory manifestations: Secondary | ICD-10-CM

## 2015-11-14 LAB — CBC
HCT: 36.2 % (ref 36.0–46.0)
Hemoglobin: 10.6 g/dL — ABNORMAL LOW (ref 12.0–15.0)
MCH: 27.7 pg (ref 26.0–34.0)
MCHC: 29.3 g/dL — ABNORMAL LOW (ref 30.0–36.0)
MCV: 94.5 fL (ref 78.0–100.0)
Platelets: 238 10*3/uL (ref 150–400)
RBC: 3.83 MIL/uL — ABNORMAL LOW (ref 3.87–5.11)
RDW: 17.3 % — ABNORMAL HIGH (ref 11.5–15.5)
WBC: 5.2 10*3/uL (ref 4.0–10.5)

## 2015-11-14 LAB — URINE CULTURE: Culture: 2000

## 2015-11-14 LAB — BASIC METABOLIC PANEL
Anion gap: 7 (ref 5–15)
BUN: 11 mg/dL (ref 6–20)
CO2: 28 mmol/L (ref 22–32)
Calcium: 8.5 mg/dL — ABNORMAL LOW (ref 8.9–10.3)
Chloride: 109 mmol/L (ref 101–111)
Creatinine, Ser: <0.3 mg/dL — ABNORMAL LOW (ref 0.44–1.00)
Glucose, Bld: 135 mg/dL — ABNORMAL HIGH (ref 65–99)
Potassium: 4 mmol/L (ref 3.5–5.1)
Sodium: 144 mmol/L (ref 135–145)

## 2015-11-14 NOTE — Progress Notes (Addendum)
[facility] PROGRESS NOTE  Jessica Burke [id number] DOB: 1961-01-01 DOA: 11/12/2015 PCP: Jessica Gravel, MD  Assessment/Plan: 1. Possible community acquired pneumonia versus influenza -Jessica Burke presenting to the emergency department with complaints of shortness of breath sounds have a temperature of 102.8 on presentation. Initial x-ray did not reveal acute infiltrate. -She was started on empiric IV antibiotic therapy with ceftriaxone and azithromycin. -Flu swab comming back positive. Chest x-ray on presentation did not reveal acute infiltrate, will discontinue ceftriaxone and azithromycin.  -Continue supportive care, anticipate discharge in the next 24 hours or she remains stable  2.  Acute hypoxemic respiratory failure -Evidence by an O2 sat of 87% on room air on presentation. -Secondary to influenza -Showing clinical improvement, will wean off supplemental oxygen tolerated  3.  Polymyositis, biopsy-proven -She currently follows Jessica Burke of neurology, and his family being treated with methotrexate and prednisone. -At baseline he requires a walker to get around however lately has been having recurrent falls. -Will consult physical therapy.  Code Status: Full code Family Communication: Family not present Disposition Plan: Transfer the patient will require greater than 2 nights hospitalization   Antibiotics:  Azithromycin  Ceftriaxone  Tamiflu  HPI/Subjective: Jessica Burke is a 55 year old female with a past medical history of polymyositis currently following Jessica Burke of neurology, on chronic methotrexate and prednisone. Presented to the emergency department with complaints of shortness of breath. At baseline patient reporting utilizing a walker to get around however lately she has become increasingly weak and has had several falls. On presentation she had a temperature 102.8 with a respiratory rate of 35. Chest x-ray did not reveal acute  infiltrate.  Objective: Filed Vitals:   11/13/15 1500 11/14/15 0645  BP: 114/78 121/71  Pulse: 81 70  Temp: 97.9 F (36.6 C) 98.2 F (36.8 C)  Resp: 185 18    Intake/Output Summary (Last 24 hours) at 11/14/15 0749 Last data filed at 11/14/15 0700  Gross per 24 hour  Intake    480 ml  Output      0 ml  Net    480 ml   Filed Weights   11/13/15 0423  Weight: 82.1 kg (181 lb)    Exam:   General:  No acute distress, reports feeling better on supplemental oxygen, nontoxic appearing  Cardiovascular: Regular rate rhythm normal S1-S2  Respiratory: Improved lung exam, good air movement, clear to auscultation bilaterally  Abdomen: Soft nontender nondistended  Musculoskeletal: No edema  Data Reviewed: Basic Metabolic Panel:  Recent Labs Lab 11/12/15 2056 11/13/15 0357 11/13/15 0359 11/14/15 0443  NA 136  --  141 144  K 4.2  --  4.0 4.0  CL 98*  --  109 109  CO2 26  --  23 28  GLUCOSE 100*  --  115* 135*  BUN 12  --  9 11  CREATININE 0.45 <0.30* <0.30* <0.30*  CALCIUM 9.1  --  8.0* 8.5*   Liver Function Tests:  Recent Labs Lab 11/12/15 2056 11/13/15 0359  AST 40 34  ALT 34 30  ALKPHOS 80 67  BILITOT 0.7 0.6  PROT 7.7 6.2*  ALBUMIN 4.1 3.1*   No results for input(s): LIPASE, AMYLASE in the last 168 hours. No results for input(s): AMMONIA in the last 168 hours. CBC:  Recent Labs Lab 11/12/15 2056 11/13/15 0357 11/14/15 0443  WBC 10.8* 6.9 5.2  HGB 12.6 11.9* 10.6*  HCT 40.4 40.5 36.2  MCV 88.8 93.3 94.5  PLT 270 183 238   Cardiac Enzymes:  Recent Labs Lab 11/12/15 2056 11/13/15 0357  CKTOTAL 536*  --   TROPONINI  --  0.07*   BNP (last 3 results) No results for input(s): BNP in the last 8760 hours.  ProBNP (last 3 results) No results for input(s): PROBNP in the last 8760 hours.  CBG:  Recent Labs Lab 11/12/15 2122  GLUCAP 92    Recent Results (from the past 240 hour(s))  MRSA PCR Screening     Status: None   Collection Time:  11/13/15  3:16 AM  Result Value Ref Range Status   MRSA by PCR NEGATIVE NEGATIVE Final    Comment:        The GeneXpert MRSA Assay (FDA approved for NASAL specimens only), is one component of a comprehensive MRSA colonization surveillance program. It is not intended to diagnose MRSA infection nor to guide or monitor treatment for MRSA infections.      Studies: Dg Chest Port 1 View  11/12/2015  CLINICAL DATA:  Chest pain for several hours EXAM: PORTABLE CHEST 1 VIEW COMPARISON:  04/17/2015 FINDINGS: Cardiac shadow is stable in appearance. The overall inspiratory effort is poor with crowding of the vascular markings although no focal infiltrate or sizable effusion is seen. No bony abnormality is noted. IMPRESSION: Poor inspiratory effort without acute abnormality. Electronically Signed   By: Inez Catalina M.D.   On: 11/12/2015 21:41    Scheduled Meds: . antiseptic oral rinse  7 mL Mouth Rinse q12n4p  . chlorhexidine  15 mL Mouth Rinse BID  . enoxaparin (LOVENOX) injection  40 mg Subcutaneous Q24H  . folic acid  1 mg Oral Daily  . hydrocortisone sod succinate (SOLU-CORTEF) inj  50 mg Intravenous Q8H  . [START ON 11/20/2015] methotrexate  10 mg Oral Weekly  . oseltamivir  75 mg Oral BID  . sodium chloride flush  3 mL Intravenous Q12H   Continuous Infusions:   Principal Problem:   Acute hypoxemic respiratory failure (HCC) Active Problems:   HTN (hypertension)   Polymyositis ([place])   CAP (community acquired pneumonia)    Time spent: 43 min    Kelvin Cellar  [facility] Pager [phone]. If 7PM-7AM, please contact night-coverage at [email or web address], password [facility] 11/14/2015, 7:49 AM  LOS: 2 days

## 2015-11-14 NOTE — Progress Notes (Signed)
Best after several attempts, NIF -24.

## 2015-11-14 NOTE — Progress Notes (Signed)
NIF -26. Best of several attempts and multiple teaching efforts.  Not clear if this is pt's best effort.

## 2015-11-15 DIAGNOSIS — J1100 Influenza due to unidentified influenza virus with unspecified type of pneumonia: Principal | ICD-10-CM

## 2015-11-15 MED ORDER — OSELTAMIVIR PHOSPHATE 75 MG PO CAPS: 75.0000 mg | ORAL_CAPSULE | Freq: Two times a day (BID) | ORAL | Status: DC

## 2015-11-15 NOTE — Progress Notes (Signed)
Pt selected [facility] for HH needs. Referral given to in house rep.  

## 2015-11-15 NOTE — Evaluation (Signed)
Physical Therapy Evaluation Patient Details Name: Jessica Burke MRN: [id number] DOB: 1960/09/30 Today's Date: 11/15/2015   History of Present Illness  55 y.o. female with h/o polymyositis and  L humerus fx (conservative management) 09/2015 2* fall admitted with acute respiratory failure, flu.   Clinical Impression  Pt reports that at baseline she requires physical assist for supine to sit and sit to stand, she can ambulate independently with 4 wheeled RW but has had increasingly frequent falls. She was receiving HHPT and OT prior to admission. She declined mobility today as she only trusts her family to help her and is understandably quite nervous about falling. We performed BLE and LUE exercises at bed level. She feels she and family will be able to manage her care with the equipment they have. Resumption of HHPT/OT is recommended.     Follow Up Recommendations Home health PT    Equipment Recommendations  None recommended by PT (HHPT was ordering hospital bed prior to this admission per pt)   Recommendations for Other Services       Precautions / Restrictions Precautions Precautions: Fall Precaution Comments: pt reports multiple falls in past year, she requires physical assist to get up from floor Restrictions Weight Bearing Restrictions: No      Mobility  Bed Mobility               General bed mobility comments: pt declined mobility, due to having multiple recent falls she only trusts her close family to assist her  Transfers                    Ambulation/Gait                Stairs            Wheelchair Mobility    Modified Rankin (Stroke Patients Only)       Balance                                             Pertinent Vitals/Pain Pain Assessment: No/denies pain    Home Living Family/patient expects to be discharged to:: Private residence Living Arrangements: Spouse/significant other Available Help at  Discharge: Family;Available PRN/intermittently (husband works 12 hr shifts) Type of Home: House Home Access: Level entry     Home Layout: Two level;Able to live on main level with bedroom/bathroom Home Equipment: Walker - 4 wheels;Bedside commode;Wheelchair - manual Additional Comments: PTA had HHPT, OT, hospital bed being ordered; family and friends come when her husband is working but has some periods of time alone    Prior Function Level of Independence: Needs assistance   Gait / Transfers Assistance Needed: assist for OOB and for sit to stand  ADL's / Homemaking Assistance Needed: assist for dressing/bathing        Hand Dominance        Extremity/Trunk Assessment   Upper Extremity Assessment: Generalized weakness (R shoulder elevation approx 60* AROM, L shoulder elevation 40* AROM, 110* AAROM, B elbow/wrist/hand WFL)           Lower Extremity Assessment: Generalized weakness (bed level muscle testing, B hip ABD 4/5, hip ADD -3/5, hip flexion -3/5, hip ext 4/5, ankle DF 4/5,)         Communication   Communication: No difficulties  Cognition Arousal/Alertness: Awake/alert Behavior During Therapy: WFL for tasks assessed/performed Overall Cognitive Status: Within  Functional Limits for tasks assessed                      General Comments      Exercises General Exercises - Upper Extremity Shoulder Flexion: AAROM;Left;10 reps;Supine General Exercises - Lower Extremity Ankle Circles/Pumps: AROM;Both;10 reps;Supine Heel Slides: AAROM;AROM;10 reps;Both;Supine (manually resisted hip extension, physical assist for hip flexion) Hip ABduction/ADduction: AROM;AAROM;Both;10 reps;Supine (manually resisted hip ABD, physical assist for ADD)      Assessment/Plan    PT Assessment All further PT needs can be met in the next venue of care (pt to DC home today)  PT Diagnosis Generalized weakness;Difficulty walking   PT Problem List Decreased strength;Decreased range  of motion;Decreased mobility  PT Treatment Interventions     PT Goals (Current goals can be found in the Care Plan section) Acute Rehab PT Goals Patient Stated Goal: to 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, participate at church and support group PT Goal Formulation: With patient    Frequency     Barriers to discharge        Co-evaluation               End of Session   Activity Tolerance: Patient tolerated treatment well;No increased pain Patient left: in bed;with call bell/phone within reach;with bed alarm set Nurse Communication: Mobility status         Time: 6811-5726 PT Time Calculation (min) (ACUTE ONLY): 33 min   Charges:   PT Evaluation $PT Eval Moderate Complexity: 1 Procedure PT Treatments $Therapeutic Exercise: 8-22 mins   PT G Codes:        Philomena Doheny 11/15/2015, 9:44 AM [phone]

## 2015-11-15 NOTE — Discharge Summary (Addendum)
Physician Discharge Summary  Jessica Burke [id number] DOB: 1960/09/11 DOA: 11/12/2015  PCP: Jani Gravel, MD  Admit date: 11/12/2015 Discharge date: 11/15/2015  Time spent: 35 minutes  Recommendations for Outpatient Follow-up:    1. Please follow-up on Jessica Burke respiratory status, she was admitted for respiratory failure likely secondary to influenza. She was discharged on Tamiflu 75 mg by mouth twice a day for a total of 5 days treatment 2. She was set up with home health services prior to discharge    Discharge Diagnoses:  Principal Problem:   Acute hypoxemic respiratory failure ([place]) Active Problems:   HTN (hypertension)   Polymyositis ([place])   CAP (community acquired pneumonia)   Discharge Condition: Stable  Diet recommendation: Regular diet  Filed Weights   11/13/15 0423  Weight: 82.1 kg (181 lb)    History of present illness:  Jessica Burke is a 55 y.o. female with a past medical history significant for polymyositis who presents with cough and dyspnea.  The patient was in her usual state of health until around the last 2-3 days when she started developing symptoms of URI with cough, chest congestion, coryza. Today, this abruptly worsened, and she felt that she couldn't catch her breath, and further she developed a fever, so she came to the ER.  In the ED, she was febrile, hypoxic, tachycardic, and tachypneic. CXR without opacity. Na 136, K 4.2, Cr 0.4, WBC 10.8K, Hgb 12, CK pending, TNI negative, lactate normal. There was concern for need to intubate initially in the ER, but she did not require this or intubation and stabilized with supplemental oxygen. Antibiotics and fluids were admininstered after collection of culture data per CODE sepsis protocol, and TR[facility] were asked to evaluate for admission.  Hospital Course:  Jessica Burke is a 55 year old female with a past medical history of polymyositis currently following Dr.WIllis of neurology, on chronic  methotrexate and prednisone. Presented to the emergency department with complaints of shortness of breath. At baseline patient reporting utilizing a walker to get around however lately she has become increasingly weak and has had several falls. On presentation she had a temperature 102.8 with a respiratory rate of 35. Chest x-ray did not reveal acute infiltrate.   Influenza -Jessica Burke presenting to the emergency department with complaints of shortness of breath having a temperature of 102.8 on presentation. Initial x-ray did not reveal acute infiltrate. -She was started on empiric IV antibiotic therapy with ceftriaxone and azithromycin. -Flu swab comming back positive. Chest x-ray on presentation did not reveal acute infiltrate for which ceftriaxone and azithromycin were discontinued. -She showed clinical improvement and by 11/15/2015 felt well enough to go home on this date  2. Acute hypoxemic respiratory failure -Evidence by an O2 sat of 87% on room air on presentation. -Secondary to influenza -Showing clinical improvement was weaned off. Having oxygen saturations in the 90s on room air, without evidence of respiratory distress or failure.  3. Polymyositis, biopsy-proven -She currently follows Dr. Jannifer Franklin of neurology, and his family being treated with methotrexate and prednisone. -At baseline he requires a walker to get around however lately has been having recurrent falls. -Will set her up with home health services   Discharge Exam: Filed Vitals:   11/14/15 2110 11/15/15 0438  BP: 161/89 154/80  Pulse: 76 82  Temp: 98.2 F (36.8 C) 98.2 F (36.8 C)  Resp: 20 20    General: No acute distress, reports feeling better on supplemental oxygen, nontoxic appearing  Cardiovascular: Regular rate rhythm normal  S1-S2  Respiratory: Improved lung exam, good air movement, clear to auscultation bilaterally  Abdomen: Soft nontender nondistended  Musculoskeletal: No edema  Discharge  Instructions   Discharge Instructions    Call MD for:  difficulty breathing, headache or visual disturbances    Complete by:  As directed      Call MD for:  difficulty breathing, headache or visual disturbances    Complete by:  As directed      Call MD for:  extreme fatigue    Complete by:  As directed      Call MD for:  extreme fatigue    Complete by:  As directed      Call MD for:  hives    Complete by:  As directed      Call MD for:  hives    Complete by:  As directed      Call MD for:  persistant dizziness or light-headedness    Complete by:  As directed      Call MD for:  persistant dizziness or light-headedness    Complete by:  As directed      Call MD for:  persistant nausea and vomiting    Complete by:  As directed      Call MD for:  persistant nausea and vomiting    Complete by:  As directed      Call MD for:  redness, tenderness, or signs of infection (pain, swelling, redness, odor or green/yellow discharge around incision site)    Complete by:  As directed      Call MD for:  redness, tenderness, or signs of infection (pain, swelling, redness, odor or green/yellow discharge around incision site)    Complete by:  As directed      Call MD for:  severe uncontrolled pain    Complete by:  As directed      Call MD for:  severe uncontrolled pain    Complete by:  As directed      Call MD for:  temperature >100.4    Complete by:  As directed      Call MD for:  temperature >100.4    Complete by:  As directed      Call MD for:    Complete by:  As directed      Call MD for:    Complete by:  As directed      Diet - low sodium heart healthy    Complete by:  As directed      Diet - low sodium heart healthy    Complete by:  As directed      Increase activity slowly    Complete by:  As directed      Increase activity slowly    Complete by:  As directed           Current Discharge Medication List    START taking these medications   Details  oseltamivir (TAMIFLU) 75 MG  capsule Take 1 capsule (75 mg total) by mouth 2 (two) times daily. Qty: 6 capsule, Refills: 0      CONTINUE these medications which have NOT CHANGED   Details  alendronate (FOSAMAX) 70 MG tablet Take 70 mg by mouth every 7 (seven) days. Take with a full glass of water on an empty stomach.    Calcium-Vitamin D-Vitamin K (VIACTIV PO) Take 2 Units by mouth daily.    ergocalciferol (VITAMIN D2) 50000 UNITS capsule Take 50,000 Units by mouth once a week.  folic acid (FOLVITE) 1 MG tablet Take 1 mg by mouth daily.    methotrexate (RHEUMATREX) 2.5 MG tablet Take 10 mg by mouth once a week. Caution:Chemotherapy. Protect from light.    oxyCODONE-acetaminophen (PERCOCET) 5-325 MG tablet Take 1-2 tablets by mouth every 4 (four) hours as needed. Qty: 20 tablet, Refills: 0    predniSONE (DELTASONE) 5 MG tablet Take 10 mg by mouth daily.     ranitidine (ZANTAC) 150 MG tablet Take 150 mg by mouth 2 (two) times daily as needed for heartburn.     telmisartan-hydrochlorothiazide (MICARDIS HCT) 80-25 MG tablet Take 1 tablet by mouth daily.      STOP taking these medications     ondansetron (ZOFRAN) 4 MG tablet        No Known Allergies Follow-up Information    Follow up with Jani Gravel, MD In 1 week.   Specialty:  Internal Medicine   Contact information:   [place] [address] [phone]       Follow up with Lenor Coffin, MD In 2 weeks.   Specialty:  Neurology   Contact information:   [address] [place] [address] [phone]        The results of significant diagnostics from this hospitalization (including imaging, microbiology, ancillary and laboratory) are listed below for reference.    Significant Diagnostic Studies: Dg Chest Port 1 View  11/12/2015  CLINICAL DATA:  Chest pain for several hours EXAM: PORTABLE CHEST 1 VIEW COMPARISON:  04/17/2015 FINDINGS: Cardiac shadow is stable in appearance. The overall  inspiratory effort is poor with crowding of the vascular markings although no focal infiltrate or sizable effusion is seen. No bony abnormality is noted. IMPRESSION: Poor inspiratory effort without acute abnormality. Electronically Signed   By: Inez Catalina M.D.   On: 11/12/2015 21:41    Microbiology: Recent Results (from the past 240 hour(s))  Blood Culture (routine x 2)     Status: None (Preliminary result)   Collection Time: 11/12/15  9:00 PM  Result Value Ref Range Status   Specimen Description BLOOD RIGHT ANTECUBITAL  Final   Special Requests BOTTLES DRAWN AEROBIC AND ANAEROBIC 5CC  Final   Culture   Final    NO GROWTH 1 DAY Performed at [facility]    Report Status PENDING  Incomplete  Blood Culture (routine x 2)     Status: None (Preliminary result)   Collection Time: 11/12/15  9:30 PM  Result Value Ref Range Status   Specimen Description BLOOD BLOOD LEFT HAND  Final   Special Requests BOTTLES DRAWN AEROBIC AND ANAEROBIC 5CC  Final   Culture   Final    NO GROWTH 1 DAY Performed at [facility]    Report Status PENDING  Incomplete  Urine culture     Status: None   Collection Time: 11/12/15 11:42 PM  Result Value Ref Range Status   Specimen Description URINE, RANDOM  Final   Special Requests NONE  Final   Culture   Final    2,000 COLONIES/mL INSIGNIFICANT GROWTH Performed at [facility]    Report Status 11/14/2015 FINAL  Final  MRSA PCR Screening     Status: None   Collection Time: 11/13/15  3:16 AM  Result Value Ref Range Status   MRSA by PCR NEGATIVE NEGATIVE Final    Comment:        The GeneXpert MRSA Assay (FDA approved for NASAL specimens only), is one component of  a comprehensive MRSA colonization surveillance program. It is not intended to diagnose MRSA infection nor to guide or monitor treatment for MRSA infections.      Labs: Basic Metabolic Panel:  Recent Labs Lab 11/12/15 2056 11/13/15 0357 11/13/15 0359 11/14/15 0443   NA 136  --  141 144  K 4.2  --  4.0 4.0  CL 98*  --  109 109  CO2 26  --  23 28  GLUCOSE 100*  --  115* 135*  BUN 12  --  9 11  CREATININE 0.45 <0.30* <0.30* <0.30*  CALCIUM 9.1  --  8.0* 8.5*   Liver Function Tests:  Recent Labs Lab 11/12/15 2056 11/13/15 0359  AST 40 34  ALT 34 30  ALKPHOS 80 67  BILITOT 0.7 0.6  PROT 7.7 6.2*  ALBUMIN 4.1 3.1*   No results for input(s): LIPASE, AMYLASE in the last 168 hours. No results for input(s): AMMONIA in the last 168 hours. CBC:  Recent Labs Lab 11/12/15 2056 11/13/15 0357 11/14/15 0443  WBC 10.8* 6.9 5.2  HGB 12.6 11.9* 10.6*  HCT 40.4 40.5 36.2  MCV 88.8 93.3 94.5  PLT 270 183 238   Cardiac Enzymes:  Recent Labs Lab 11/12/15 2056 11/13/15 0357  CKTOTAL 536*  --   TROPONINI  --  0.07*   BNP: BNP (last 3 results) No results for input(s): BNP in the last 8760 hours.  ProBNP (last 3 results) No results for input(s): PROBNP in the last 8760 hours.  CBG:  Recent Labs Lab 11/12/15 2122  GLUCAP 92       Signed:  Kelvin Cellar MD.  [facility] 11/15/2015, 8:48 AM

## 2015-11-15 NOTE — Care Management Important Message (Signed)
Important Message  Patient Details IM Letter given to Cookie/Case Manager to present to Patient. Name: ZELAH WYLAND MRN: [id number] Date of Birth: 30-Oct-1960   Medicare Important Message Given:  Yes    Camillo Flaming 11/15/2015, 11:17 AMImportant Message  Patient Details  Name: HENDEL PAMPERIN MRN: [id number] Date of Birth: 1961/03/20   Medicare Important Message Given:  Yes    Camillo Flaming 11/15/2015, 11:17 AM

## 2015-11-15 NOTE — Progress Notes (Signed)
Best of 3: NIF -20

## 2015-11-15 NOTE — Care Management Note (Signed)
Case Management Note  Patient Details  Name: Jessica Burke MRN: [id number] Date of Birth: July 26, 1961  Subjective/Objective:                    Action/Plan: Pt discharging home with [facility]. IM given to pt.    Expected Discharge Date:                  Expected Discharge Plan:  [place]  In-House Referral:     Discharge planning Services  CM Consult  Post Acute Care Choice:    Choice offered to:  Patient  DME Arranged:    DME Agency:     HH Arranged:  RN, PT, OT, Nurse's Aide [place] Agency:  [facility]  Status of Service:  In process, will continue to follow  Medicare Important Message Given:    Date Medicare IM Given:    Medicare IM give by:    Date Additional Medicare IM Given:    Additional Medicare Important Message give by:     If discussed at [place] of Stay Meetings, dates discussed:    Additional CommentsPurcell Mouton, RN 11/15/2015, 10:33 AM

## 2015-11-15 NOTE — Progress Notes (Signed)
Went over all discharge information with patient and husband.  Prescriptions and discharge summary given.  VSS.  Pt will be wheeled out when ride arrives.  [place] services set up.

## 2015-11-16 DIAGNOSIS — G710 Muscular dystrophy: Secondary | ICD-10-CM | POA: Diagnosis not present

## 2015-11-16 DIAGNOSIS — M3320 Polymyositis, organ involvement unspecified: Secondary | ICD-10-CM | POA: Diagnosis not present

## 2015-11-16 DIAGNOSIS — S42202D Unspecified fracture of upper end of left humerus, subsequent encounter for fracture with routine healing: Secondary | ICD-10-CM | POA: Diagnosis not present

## 2015-11-16 DIAGNOSIS — I10 Essential (primary) hypertension: Secondary | ICD-10-CM | POA: Diagnosis not present

## 2015-11-18 DIAGNOSIS — S42202D Unspecified fracture of upper end of left humerus, subsequent encounter for fracture with routine healing: Secondary | ICD-10-CM | POA: Diagnosis not present

## 2015-11-18 DIAGNOSIS — M3320 Polymyositis, organ involvement unspecified: Secondary | ICD-10-CM | POA: Diagnosis not present

## 2015-11-18 DIAGNOSIS — I10 Essential (primary) hypertension: Secondary | ICD-10-CM | POA: Diagnosis not present

## 2015-11-18 DIAGNOSIS — G710 Muscular dystrophy: Secondary | ICD-10-CM | POA: Diagnosis not present

## 2015-11-18 LAB — CULTURE, BLOOD (ROUTINE X 2)
Culture: NO GROWTH
Culture: NO GROWTH

## 2015-11-21 DIAGNOSIS — I10 Essential (primary) hypertension: Secondary | ICD-10-CM | POA: Diagnosis not present

## 2015-11-21 DIAGNOSIS — M3320 Polymyositis, organ involvement unspecified: Secondary | ICD-10-CM | POA: Diagnosis not present

## 2015-11-21 DIAGNOSIS — G710 Muscular dystrophy: Secondary | ICD-10-CM | POA: Diagnosis not present

## 2015-11-21 DIAGNOSIS — S42202D Unspecified fracture of upper end of left humerus, subsequent encounter for fracture with routine healing: Secondary | ICD-10-CM | POA: Diagnosis not present

## 2015-11-24 DIAGNOSIS — G710 Muscular dystrophy: Secondary | ICD-10-CM | POA: Diagnosis not present

## 2015-11-24 DIAGNOSIS — I10 Essential (primary) hypertension: Secondary | ICD-10-CM | POA: Diagnosis not present

## 2015-11-24 DIAGNOSIS — M3320 Polymyositis, organ involvement unspecified: Secondary | ICD-10-CM | POA: Diagnosis not present

## 2015-11-24 DIAGNOSIS — S42202D Unspecified fracture of upper end of left humerus, subsequent encounter for fracture with routine healing: Secondary | ICD-10-CM | POA: Diagnosis not present

## 2015-11-25 DIAGNOSIS — M3320 Polymyositis, organ involvement unspecified: Secondary | ICD-10-CM | POA: Diagnosis not present

## 2015-11-25 DIAGNOSIS — I10 Essential (primary) hypertension: Secondary | ICD-10-CM | POA: Diagnosis not present

## 2015-11-25 DIAGNOSIS — S42202D Unspecified fracture of upper end of left humerus, subsequent encounter for fracture with routine healing: Secondary | ICD-10-CM | POA: Diagnosis not present

## 2015-11-25 DIAGNOSIS — G710 Muscular dystrophy: Secondary | ICD-10-CM | POA: Diagnosis not present

## 2015-11-28 DIAGNOSIS — S42295D Other nondisplaced fracture of upper end of left humerus, subsequent encounter for fracture with routine healing: Secondary | ICD-10-CM | POA: Diagnosis not present

## 2015-11-29 DIAGNOSIS — Z0000 Encounter for general adult medical examination without abnormal findings: Secondary | ICD-10-CM | POA: Diagnosis not present

## 2015-11-29 DIAGNOSIS — E7800 Pure hypercholesterolemia, unspecified: Secondary | ICD-10-CM | POA: Diagnosis not present

## 2015-11-29 DIAGNOSIS — E559 Vitamin D deficiency, unspecified: Secondary | ICD-10-CM | POA: Diagnosis not present

## 2015-11-29 DIAGNOSIS — M3320 Polymyositis, organ involvement unspecified: Secondary | ICD-10-CM | POA: Diagnosis not present

## 2015-11-30 DIAGNOSIS — M3320 Polymyositis, organ involvement unspecified: Secondary | ICD-10-CM | POA: Diagnosis not present

## 2015-11-30 DIAGNOSIS — G710 Muscular dystrophy: Secondary | ICD-10-CM | POA: Diagnosis not present

## 2015-11-30 DIAGNOSIS — S42202D Unspecified fracture of upper end of left humerus, subsequent encounter for fracture with routine healing: Secondary | ICD-10-CM | POA: Diagnosis not present

## 2015-11-30 DIAGNOSIS — I10 Essential (primary) hypertension: Secondary | ICD-10-CM | POA: Diagnosis not present

## 2015-12-07 DIAGNOSIS — I10 Essential (primary) hypertension: Secondary | ICD-10-CM | POA: Diagnosis not present

## 2015-12-07 DIAGNOSIS — S42202D Unspecified fracture of upper end of left humerus, subsequent encounter for fracture with routine healing: Secondary | ICD-10-CM | POA: Diagnosis not present

## 2015-12-07 DIAGNOSIS — G710 Muscular dystrophy: Secondary | ICD-10-CM | POA: Diagnosis not present

## 2015-12-07 DIAGNOSIS — M3320 Polymyositis, organ involvement unspecified: Secondary | ICD-10-CM | POA: Diagnosis not present

## 2015-12-12 DIAGNOSIS — M3320 Polymyositis, organ involvement unspecified: Secondary | ICD-10-CM | POA: Diagnosis not present

## 2015-12-12 DIAGNOSIS — M359 Systemic involvement of connective tissue, unspecified: Secondary | ICD-10-CM | POA: Diagnosis not present

## 2015-12-14 DIAGNOSIS — M3320 Polymyositis, organ involvement unspecified: Secondary | ICD-10-CM | POA: Diagnosis not present

## 2015-12-14 DIAGNOSIS — I10 Essential (primary) hypertension: Secondary | ICD-10-CM | POA: Diagnosis not present

## 2015-12-14 DIAGNOSIS — S42202D Unspecified fracture of upper end of left humerus, subsequent encounter for fracture with routine healing: Secondary | ICD-10-CM | POA: Diagnosis not present

## 2015-12-14 DIAGNOSIS — G710 Muscular dystrophy: Secondary | ICD-10-CM | POA: Diagnosis not present

## 2015-12-15 DIAGNOSIS — G710 Muscular dystrophy: Secondary | ICD-10-CM | POA: Diagnosis not present

## 2015-12-15 DIAGNOSIS — I10 Essential (primary) hypertension: Secondary | ICD-10-CM | POA: Diagnosis not present

## 2015-12-15 DIAGNOSIS — S42202D Unspecified fracture of upper end of left humerus, subsequent encounter for fracture with routine healing: Secondary | ICD-10-CM | POA: Diagnosis not present

## 2015-12-15 DIAGNOSIS — M3320 Polymyositis, organ involvement unspecified: Secondary | ICD-10-CM | POA: Diagnosis not present

## 2016-01-04 DIAGNOSIS — Z79899 Other long term (current) drug therapy: Secondary | ICD-10-CM | POA: Diagnosis not present

## 2016-01-04 DIAGNOSIS — M3320 Polymyositis, organ involvement unspecified: Secondary | ICD-10-CM | POA: Diagnosis not present

## 2016-01-04 DIAGNOSIS — R6889 Other general symptoms and signs: Secondary | ICD-10-CM | POA: Diagnosis not present

## 2016-01-04 DIAGNOSIS — M8580 Other specified disorders of bone density and structure, unspecified site: Secondary | ICD-10-CM | POA: Diagnosis not present

## 2016-01-11 DIAGNOSIS — M3320 Polymyositis, organ involvement unspecified: Secondary | ICD-10-CM | POA: Diagnosis not present

## 2016-01-11 DIAGNOSIS — M359 Systemic involvement of connective tissue, unspecified: Secondary | ICD-10-CM | POA: Diagnosis not present

## 2016-02-10 DIAGNOSIS — R6889 Other general symptoms and signs: Secondary | ICD-10-CM | POA: Diagnosis not present

## 2016-02-10 DIAGNOSIS — M8580 Other specified disorders of bone density and structure, unspecified site: Secondary | ICD-10-CM | POA: Diagnosis not present

## 2016-02-10 DIAGNOSIS — M3320 Polymyositis, organ involvement unspecified: Secondary | ICD-10-CM | POA: Diagnosis not present

## 2016-02-10 DIAGNOSIS — Z79899 Other long term (current) drug therapy: Secondary | ICD-10-CM | POA: Diagnosis not present

## 2016-02-11 DIAGNOSIS — M359 Systemic involvement of connective tissue, unspecified: Secondary | ICD-10-CM | POA: Diagnosis not present

## 2016-02-11 DIAGNOSIS — M3320 Polymyositis, organ involvement unspecified: Secondary | ICD-10-CM | POA: Diagnosis not present

## 2016-03-09 DIAGNOSIS — Z79899 Other long term (current) drug therapy: Secondary | ICD-10-CM | POA: Diagnosis not present

## 2016-03-09 DIAGNOSIS — M3320 Polymyositis, organ involvement unspecified: Secondary | ICD-10-CM | POA: Diagnosis not present

## 2016-03-09 DIAGNOSIS — R6889 Other general symptoms and signs: Secondary | ICD-10-CM | POA: Diagnosis not present

## 2016-03-09 DIAGNOSIS — M8580 Other specified disorders of bone density and structure, unspecified site: Secondary | ICD-10-CM | POA: Diagnosis not present

## 2016-06-13 DIAGNOSIS — Z23 Encounter for immunization: Secondary | ICD-10-CM | POA: Diagnosis not present

## 2016-06-13 DIAGNOSIS — I10 Essential (primary) hypertension: Secondary | ICD-10-CM | POA: Diagnosis not present

## 2016-06-13 DIAGNOSIS — M3320 Polymyositis, organ involvement unspecified: Secondary | ICD-10-CM | POA: Diagnosis not present

## 2016-06-13 DIAGNOSIS — E7800 Pure hypercholesterolemia, unspecified: Secondary | ICD-10-CM | POA: Diagnosis not present

## 2016-06-13 DIAGNOSIS — M8580 Other specified disorders of bone density and structure, unspecified site: Secondary | ICD-10-CM | POA: Diagnosis not present

## 2016-06-26 DIAGNOSIS — M8580 Other specified disorders of bone density and structure, unspecified site: Secondary | ICD-10-CM | POA: Diagnosis not present

## 2016-06-26 DIAGNOSIS — M25559 Pain in unspecified hip: Secondary | ICD-10-CM | POA: Diagnosis not present

## 2016-06-26 DIAGNOSIS — M810 Age-related osteoporosis without current pathological fracture: Secondary | ICD-10-CM | POA: Diagnosis not present

## 2016-06-26 DIAGNOSIS — R6889 Other general symptoms and signs: Secondary | ICD-10-CM | POA: Diagnosis not present

## 2016-06-26 DIAGNOSIS — M3320 Polymyositis, organ involvement unspecified: Secondary | ICD-10-CM | POA: Diagnosis not present

## 2016-06-26 DIAGNOSIS — M79606 Pain in leg, unspecified: Secondary | ICD-10-CM | POA: Diagnosis not present

## 2016-06-26 DIAGNOSIS — Z79899 Other long term (current) drug therapy: Secondary | ICD-10-CM | POA: Diagnosis not present

## 2016-08-02 DIAGNOSIS — M8580 Other specified disorders of bone density and structure, unspecified site: Secondary | ICD-10-CM | POA: Diagnosis not present

## 2016-08-02 DIAGNOSIS — R6889 Other general symptoms and signs: Secondary | ICD-10-CM | POA: Diagnosis not present

## 2016-08-02 DIAGNOSIS — Z79899 Other long term (current) drug therapy: Secondary | ICD-10-CM | POA: Diagnosis not present

## 2016-08-02 DIAGNOSIS — M3320 Polymyositis, organ involvement unspecified: Secondary | ICD-10-CM | POA: Diagnosis not present

## 2016-08-16 DIAGNOSIS — G710 Muscular dystrophy: Secondary | ICD-10-CM | POA: Diagnosis not present

## 2016-08-16 DIAGNOSIS — K219 Gastro-esophageal reflux disease without esophagitis: Secondary | ICD-10-CM | POA: Diagnosis not present

## 2016-08-16 DIAGNOSIS — Z0000 Encounter for general adult medical examination without abnormal findings: Secondary | ICD-10-CM | POA: Diagnosis not present

## 2016-08-16 DIAGNOSIS — I10 Essential (primary) hypertension: Secondary | ICD-10-CM | POA: Diagnosis not present

## 2016-08-16 DIAGNOSIS — M818 Other osteoporosis without current pathological fracture: Secondary | ICD-10-CM | POA: Diagnosis not present

## 2016-08-16 DIAGNOSIS — Z6830 Body mass index (BMI) 30.0-30.9, adult: Secondary | ICD-10-CM | POA: Diagnosis not present

## 2016-09-17 DIAGNOSIS — M810 Age-related osteoporosis without current pathological fracture: Secondary | ICD-10-CM | POA: Diagnosis not present

## 2016-11-16 ENCOUNTER — Other Ambulatory Visit: Payer: Self-pay | Admitting: Obstetrics & Gynecology

## 2016-11-16 DIAGNOSIS — Z1231 Encounter for screening mammogram for malignant neoplasm of breast: Secondary | ICD-10-CM

## 2016-12-14 ENCOUNTER — Ambulatory Visit
Admission: RE | Admit: 2016-12-14 | Discharge: 2016-12-14 | Disposition: A | Discharge status: Home / Self Care | Payer: Medicare HMO | Source: Ambulatory Visit | Attending: Obstetrics & Gynecology | Admitting: Obstetrics & Gynecology

## 2016-12-14 DIAGNOSIS — Z1231 Encounter for screening mammogram for malignant neoplasm of breast: Secondary | ICD-10-CM

## 2016-12-25 DIAGNOSIS — M3320 Polymyositis, organ involvement unspecified: Secondary | ICD-10-CM

## 2016-12-25 DIAGNOSIS — R079 Chest pain, unspecified: Secondary | ICD-10-CM

## 2016-12-25 HISTORY — DX: Polymyositis, organ involvement unspecified: M33.20

## 2016-12-25 HISTORY — DX: Chest pain, unspecified: R07.9

## 2017-01-02 DIAGNOSIS — Z79899 Other long term (current) drug therapy: Secondary | ICD-10-CM | POA: Diagnosis not present

## 2017-01-02 DIAGNOSIS — R630 Anorexia: Secondary | ICD-10-CM | POA: Diagnosis not present

## 2017-01-02 DIAGNOSIS — Z7952 Long term (current) use of systemic steroids: Secondary | ICD-10-CM | POA: Diagnosis not present

## 2017-01-02 DIAGNOSIS — E8809 Other disorders of plasma-protein metabolism, not elsewhere classified: Secondary | ICD-10-CM | POA: Diagnosis not present

## 2017-01-02 DIAGNOSIS — R5383 Other fatigue: Secondary | ICD-10-CM | POA: Diagnosis not present

## 2017-01-02 DIAGNOSIS — M810 Age-related osteoporosis without current pathological fracture: Secondary | ICD-10-CM | POA: Diagnosis not present

## 2017-01-02 DIAGNOSIS — R740 Nonspecific elevation of levels of transaminase and lactic acid dehydrogenase [LDH]: Secondary | ICD-10-CM | POA: Diagnosis not present

## 2017-01-02 DIAGNOSIS — M3320 Polymyositis, organ involvement unspecified: Secondary | ICD-10-CM | POA: Diagnosis not present

## 2017-01-02 DIAGNOSIS — I10 Essential (primary) hypertension: Secondary | ICD-10-CM | POA: Diagnosis not present

## 2017-01-22 ENCOUNTER — Encounter (HOSPITAL_COMMUNITY): Payer: Self-pay

## 2017-01-22 ENCOUNTER — Observation Stay (HOSPITAL_COMMUNITY): Payer: Medicare HMO

## 2017-01-22 ENCOUNTER — Observation Stay (HOSPITAL_COMMUNITY)
Admission: EM | Admit: 2017-01-22 | Discharge: 2017-01-23 | Disposition: A | Discharge status: Home / Self Care | Payer: Medicare HMO | Attending: Internal Medicine | Admitting: Internal Medicine

## 2017-01-22 ENCOUNTER — Observation Stay (HOSPITAL_BASED_OUTPATIENT_CLINIC_OR_DEPARTMENT_OTHER): Payer: Medicare HMO

## 2017-01-22 ENCOUNTER — Emergency Department (HOSPITAL_COMMUNITY): Payer: Medicare HMO

## 2017-01-22 ENCOUNTER — Other Ambulatory Visit (HOSPITAL_COMMUNITY): Payer: Medicare HMO

## 2017-01-22 DIAGNOSIS — I360 Nonrheumatic tricuspid (valve) stenosis: Secondary | ICD-10-CM | POA: Diagnosis not present

## 2017-01-22 DIAGNOSIS — R000 Tachycardia, unspecified: Secondary | ICD-10-CM | POA: Insufficient documentation

## 2017-01-22 DIAGNOSIS — R079 Chest pain, unspecified: Secondary | ICD-10-CM

## 2017-01-22 DIAGNOSIS — Z79891 Long term (current) use of opiate analgesic: Secondary | ICD-10-CM | POA: Diagnosis not present

## 2017-01-22 DIAGNOSIS — Z87891 Personal history of nicotine dependence: Secondary | ICD-10-CM | POA: Diagnosis not present

## 2017-01-22 DIAGNOSIS — G710 Muscular dystrophy: Secondary | ICD-10-CM | POA: Insufficient documentation

## 2017-01-22 DIAGNOSIS — R0602 Shortness of breath: Secondary | ICD-10-CM | POA: Diagnosis not present

## 2017-01-22 DIAGNOSIS — Z8249 Family history of ischemic heart disease and other diseases of the circulatory system: Secondary | ICD-10-CM | POA: Diagnosis not present

## 2017-01-22 DIAGNOSIS — R072 Precordial pain: Secondary | ICD-10-CM

## 2017-01-22 DIAGNOSIS — R531 Weakness: Secondary | ICD-10-CM | POA: Diagnosis not present

## 2017-01-22 DIAGNOSIS — I10 Essential (primary) hypertension: Secondary | ICD-10-CM | POA: Diagnosis not present

## 2017-01-22 DIAGNOSIS — R0789 Other chest pain: Secondary | ICD-10-CM | POA: Diagnosis not present

## 2017-01-22 DIAGNOSIS — M3320 Polymyositis, organ involvement unspecified: Secondary | ICD-10-CM | POA: Diagnosis not present

## 2017-01-22 HISTORY — DX: Chest pain, unspecified: R07.9

## 2017-01-22 LAB — BASIC METABOLIC PANEL
Anion gap: 8 (ref 5–15)
BUN: 11 mg/dL (ref 6–20)
CO2: 29 mmol/L (ref 22–32)
Calcium: 9 mg/dL (ref 8.9–10.3)
Chloride: 105 mmol/L (ref 101–111)
Creatinine, Ser: <0.3 mg/dL — ABNORMAL LOW (ref 0.44–1.00)
Glucose, Bld: 121 mg/dL — ABNORMAL HIGH (ref 65–99)
Potassium: 3.9 mmol/L (ref 3.5–5.1)
Sodium: 142 mmol/L (ref 135–145)

## 2017-01-22 LAB — CBC
HCT: 39.5 % (ref 36.0–46.0)
HCT: 38.3 % (ref 36.0–46.0)
Hemoglobin: 11.5 g/dL — ABNORMAL LOW (ref 12.0–15.0)
Hemoglobin: 11 g/dL — ABNORMAL LOW (ref 12.0–15.0)
MCH: 26.4 pg (ref 26.0–34.0)
MCH: 25.8 pg — ABNORMAL LOW (ref 26.0–34.0)
MCHC: 29.1 g/dL — ABNORMAL LOW (ref 30.0–36.0)
MCHC: 28.7 g/dL — ABNORMAL LOW (ref 30.0–36.0)
MCV: 90.8 fL (ref 78.0–100.0)
MCV: 89.9 fL (ref 78.0–100.0)
Platelets: 244 10*3/uL (ref 150–400)
Platelets: 250 10*3/uL (ref 150–400)
RBC: 4.35 MIL/uL (ref 3.87–5.11)
RBC: 4.26 MIL/uL (ref 3.87–5.11)
RDW: 17.2 % — ABNORMAL HIGH (ref 11.5–15.5)
RDW: 16.9 % — ABNORMAL HIGH (ref 11.5–15.5)
WBC: 11.3 10*3/uL — ABNORMAL HIGH (ref 4.0–10.5)
WBC: 9.7 10*3/uL (ref 4.0–10.5)

## 2017-01-22 LAB — LIPID PANEL
Cholesterol: 127 mg/dL (ref 0–200)
HDL: 37 mg/dL — ABNORMAL LOW (ref >40)
LDL Cholesterol: 60 mg/dL (ref 0–99)
Total CHOL/HDL Ratio: 3.4 RATIO
Triglycerides: 149 mg/dL (ref <150)
VLDL: 30 mg/dL (ref 0–40)

## 2017-01-22 LAB — ECHOCARDIOGRAM COMPLETE

## 2017-01-22 LAB — CREATININE, SERUM: Creatinine, Ser: <0.3 mg/dL — ABNORMAL LOW (ref 0.44–1.00)

## 2017-01-22 LAB — HIV ANTIBODY (ROUTINE TESTING W REFLEX): HIV Screen 4th Generation wRfx: NONREACTIVE

## 2017-01-22 LAB — I-STAT TROPONIN, ED: Troponin i, poc: 0.05 ng/mL (ref 0.00–0.08)

## 2017-01-22 LAB — BRAIN NATRIURETIC PEPTIDE: B Natriuretic Peptide: 76.4 pg/mL (ref 0.0–100.0)

## 2017-01-22 LAB — D-DIMER, QUANTITATIVE: D-Dimer, Quant: 0.55 ug/mL-FEU — ABNORMAL HIGH (ref 0.00–0.50)

## 2017-01-22 LAB — TROPONIN I
Troponin I: 0.05 ng/mL (ref <0.03)
Troponin I: 0.11 ng/mL (ref <0.03)

## 2017-01-22 MED ORDER — TECHNETIUM TC 99M TETROFOSMIN IV KIT
10.0000 | PACK | Freq: Once | INTRAVENOUS | Status: AC | PRN
  Administered 2017-01-22: 10 via INTRAVENOUS

## 2017-01-22 MED ORDER — PREDNISONE 5 MG PO TABS
5.0000 mg | ORAL_TABLET | ORAL | Status: DC
  Administered 2017-01-22: 5 mg via ORAL
  Filled 2017-01-22: qty 1

## 2017-01-22 MED ORDER — TECHNETIUM TC 99M TETROFOSMIN IV KIT
30.0000 | PACK | Freq: Once | INTRAVENOUS | Status: AC | PRN
  Administered 2017-01-22: 30 via INTRAVENOUS

## 2017-01-22 MED ORDER — IOPAMIDOL (ISOVUE-370) INJECTION 76%
INTRAVENOUS | Status: AC
  Administered 2017-01-22: 100 mL
  Filled 2017-01-22: qty 100

## 2017-01-22 MED ORDER — TELMISARTAN-HCTZ 80-25 MG PO TABS: 1.0000 | ORAL_TABLET | Freq: Every day | ORAL | Status: DC

## 2017-01-22 MED ORDER — FOLIC ACID 1 MG PO TABS
1.0000 mg | ORAL_TABLET | Freq: Every day | ORAL | Status: DC
  Administered 2017-01-22 – 2017-01-23 (×2): 1 mg via ORAL
  Filled 2017-01-22 (×2): qty 1

## 2017-01-22 MED ORDER — IRBESARTAN 300 MG PO TABS
300.0000 mg | ORAL_TABLET | Freq: Every day | ORAL | Status: DC
  Administered 2017-01-22 – 2017-01-23 (×2): 300 mg via ORAL
  Filled 2017-01-22 (×2): qty 1

## 2017-01-22 MED ORDER — REGADENOSON 0.4 MG/5ML IV SOLN
INTRAVENOUS | Status: AC
  Administered 2017-01-22: 0.4 mg via INTRAVENOUS
  Filled 2017-01-22: qty 5

## 2017-01-22 MED ORDER — NITROGLYCERIN 0.4 MG SL SUBL: 0.4000 mg | SUBLINGUAL_TABLET | SUBLINGUAL | Status: DC | PRN

## 2017-01-22 MED ORDER — OXYCODONE-ACETAMINOPHEN 5-325 MG PO TABS: 1.0000 | ORAL_TABLET | ORAL | Status: DC | PRN

## 2017-01-22 MED ORDER — REGADENOSON 0.4 MG/5ML IV SOLN
0.4000 mg | Freq: Once | INTRAVENOUS | Status: AC
  Administered 2017-01-22: 0.4 mg via INTRAVENOUS
  Filled 2017-01-22: qty 5

## 2017-01-22 MED ORDER — ENOXAPARIN SODIUM 40 MG/0.4ML ~~LOC~~ SOLN
40.0000 mg | Freq: Every day | SUBCUTANEOUS | Status: DC
  Administered 2017-01-22 – 2017-01-23 (×2): 40 mg via SUBCUTANEOUS
  Filled 2017-01-22 (×2): qty 0.4

## 2017-01-22 MED ORDER — HYDROCHLOROTHIAZIDE 25 MG PO TABS
25.0000 mg | ORAL_TABLET | Freq: Every day | ORAL | Status: DC
  Administered 2017-01-22 – 2017-01-23 (×2): 25 mg via ORAL
  Filled 2017-01-22 (×2): qty 1

## 2017-01-22 MED ORDER — CLONAZEPAM 0.5 MG PO TABS: 0.5000 mg | ORAL_TABLET | Freq: Two times a day (BID) | ORAL | Status: DC | PRN

## 2017-01-22 MED ORDER — FAMOTIDINE 20 MG PO TABS
20.0000 mg | ORAL_TABLET | Freq: Two times a day (BID) | ORAL | Status: DC
  Administered 2017-01-22 – 2017-01-23 (×3): 20 mg via ORAL
  Filled 2017-01-22 (×3): qty 1

## 2017-01-22 MED ORDER — PANTOPRAZOLE SODIUM 40 MG PO TBEC
40.0000 mg | DELAYED_RELEASE_TABLET | Freq: Every day | ORAL | Status: DC
  Administered 2017-01-22 – 2017-01-23 (×2): 40 mg via ORAL
  Filled 2017-01-22 (×2): qty 1

## 2017-01-22 MED ORDER — ONDANSETRON HCL 4 MG/2ML IJ SOLN: 4.0000 mg | Freq: Four times a day (QID) | INTRAMUSCULAR | Status: DC | PRN

## 2017-01-22 MED ORDER — ASPIRIN EC 325 MG PO TBEC
325.0000 mg | DELAYED_RELEASE_TABLET | Freq: Every day | ORAL | Status: DC
  Administered 2017-01-22 – 2017-01-23 (×2): 325 mg via ORAL
  Filled 2017-01-22 (×2): qty 1

## 2017-01-22 MED ORDER — ACETAMINOPHEN 325 MG PO TABS: 650.0000 mg | ORAL_TABLET | ORAL | Status: DC | PRN

## 2017-01-22 NOTE — ED Notes (Signed)
Patient transported to CT 

## 2017-01-22 NOTE — Progress Notes (Signed)
   Regina Eck presented for a nuclear stress test today.  No immediate complications.  Stress imaging is pending at this time.  Preliminary EKG findings may be listed in the chart, but the stress test result will not be finalized until perfusion imaging is complete.  Tami Lin Shateria Paternostro, PA-C 01/22/2017, 1:55 PM

## 2017-01-22 NOTE — Progress Notes (Signed)
  Echocardiogram 2D Echocardiogram has been performed.  Jessica Burke 01/22/2017, 12:06 PM

## 2017-01-22 NOTE — Progress Notes (Signed)
Patient admitted after midnight, please see H&P.  For echo and stress test today.  Patient has been under much stress lately with death of cousin and her own medical issues.  Eulogio Bear DO

## 2017-01-22 NOTE — ED Notes (Signed)
Attempted to call report

## 2017-01-22 NOTE — ED Provider Notes (Signed)
[facility] DEPT Provider Note   CSN: [id number] Arrival date & time: 01/22/17  4098  By signing my name below, I, Oleh Genin, attest that this documentation has been prepared under the direction and in the presence of Jerelene Salaam, Barbette Hair, MD. Electronically Signed: Oleh Genin, Scribe. 01/22/17. 5:11 AM.   History   Chief Complaint Chief Complaint  Patient presents with  . Chest Pain    HPI Jessica Burke is a 56 y.o. female with history of HTN, muscular dystrophy, and polymyositis who presents to the ED with dyspnea. This patient states that she developed dyspnea 2 hours ago with a brief period of nausea and L sided chest pain. Prior to arrival in triage, her chest pain and nausea has resolved; however continues to report dyspnea at interview. She states that she chronically experiences similar respiratory symptoms due to her rheumatologic history. She denies any fever or chills. Denies any cardiac history. No peripheral edema. No history of PE/DVT.  The history is provided by the patient. No language interpreter was used.  Shortness of Breath  This is a chronic problem. The average episode lasts 3 minutes. The problem occurs continuously.The current episode started 1 to 2 hours ago. The problem has not changed since onset.Associated symptoms include chest pain (resolved). Pertinent negatives include no fever, no cough and no leg swelling.    Past Medical History:  Diagnosis Date  . Anemia   . Arthritis   . Depression    2003  . Hypertension   . 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[place])   . Pneumonia    2016  . Polymyositis ([place])   . Shortness of breath dyspnea     Patient Active Problem List   Diagnosis Date Noted  . CAP (community acquired pneumonia)   . Polymyositis ([place]) 08/03/2015  . Acute respiratory failure ([place]) 11/21/2014  . Acute hypoxemic respiratory failure ([place]) 11/20/2014  . Influenza A (H1N1) 11/20/2014  . Autoimmune disease ([place]) 11/20/2014  . Chest  pain 03/17/2013  . Leukocytosis 03/17/2013  . Anemia 03/17/2013  . Hypokalemia 03/17/2013  . HTN (hypertension) 03/17/2013    Past Surgical History:  Procedure Laterality Date  . CESAREAN SECTION    . ENDOMETRIAL ABLATION    . LEEP      OB History    Gravida Para Term Preterm AB Living   1 1           SAB TAB Ectopic Multiple Live Births                   Home Medications    Prior to Admission medications   Medication Sig Start Date End Date Taking? Authorizing Provider  Abaloparatide (TYMLOS) 3120 MCG/1.56ML SOPN Inject 1.56 mg into the skin daily.   Yes [provider]  Calcium-Vitamin D-Vitamin K (VIACTIV PO) Take 2 Units by mouth daily.   Yes [provider]  Corticotropin (ACTHAR HP IJ) Inject 1 application as directed 2 (two) times a week.   Yes [provider]  folic acid (FOLVITE) 1 MG tablet Take 1 mg by mouth daily.   Yes [provider]  methotrexate (RHEUMATREX) 2.5 MG tablet Take 10 mg by mouth once a week. Caution:Chemotherapy. Protect from light.   Yes [provider]  oxyCODONE-acetaminophen (PERCOCET) 5-325 MG tablet Take 1-2 tablets by mouth every 4 (four) hours as needed. Patient taking differently: Take 1-2 tablets by mouth every 4 (four) hours as needed for moderate pain or severe pain.  10/12/15  Yes Harris,  Abigail, PA-C  predniSONE (DELTASONE) 5 MG tablet Take 5 mg by mouth every other day.    Yes [provider]  ranitidine (ZANTAC) 150 MG tablet Take 150 mg by mouth 2 (two) times daily as needed for heartburn.    Yes [provider]  telmisartan-hydrochlorothiazide (MICARDIS HCT) 80-25 MG tablet Take 1 tablet by mouth daily.   Yes [provider]    Family History Family History  Problem Relation Age of Onset  . Hypertension Father   . Heart disease Father   . Hypertension Mother   . Diabetes Maternal Aunt   . Breast cancer Maternal Aunt     Social History Social History    Substance Use Topics  . Smoking status: Former Smoker    Quit date: 12/25/1988  . Smokeless tobacco: Never Used  . Alcohol use No     Allergies   Patient has no known allergies.   Review of Systems Review of Systems  Constitutional: Negative for fever.  Respiratory: Positive for shortness of breath. Negative for cough.   Cardiovascular: Positive for chest pain (resolved). Negative for leg swelling.  Gastrointestinal: Positive for nausea (resolved).  All other systems reviewed and are negative.    Physical Exam Updated Vital Signs BP 128/89 (BP Location: Left Arm)   Pulse 81   Temp 98 F (36.7 C) (Oral)   Resp (!) 26   LMP 11/26/2012   SpO2 99%   Physical Exam  Constitutional: She is oriented to person, place, and time. She appears well-developed and well-nourished.  Chronically ill appearing, NAD  HENT:  Head: Normocephalic and atraumatic.  Cardiovascular: Normal rate, regular rhythm and normal heart sounds.   No murmur heard. Pulmonary/Chest: Effort normal and breath sounds normal. No respiratory distress. She has no wheezes.  Abdominal: Soft.  Musculoskeletal: She exhibits edema.  Neurological: She is alert and oriented to person, place, and time.  Skin: Skin is warm and dry.  Psychiatric: She has a normal mood and affect.  Nursing note and vitals reviewed.    ED Treatments / Results  Labs (all labs ordered are listed, but only abnormal results are displayed) Labs Reviewed  BASIC METABOLIC PANEL - Abnormal; Notable for the following:       Result Value   Glucose, Bld 121 (*)    Creatinine, Ser <0.30 (*)    All other components within normal limits  CBC - Abnormal; Notable for the following:    WBC 11.3 (*)    Hemoglobin 11.5 (*)    MCHC 29.1 (*)    RDW 17.2 (*)    All other components within normal limits  D-DIMER, QUANTITATIVE (NOT AT [facility]) - Abnormal; Notable for the following:    D-Dimer, Quant 0.55 (*)    All other components within normal  limits  BRAIN NATRIURETIC PEPTIDE  I-STAT TROPOININ, ED    EKG  EKG Interpretation  Date/Time:  Tuesday Jan 22 2017 01:01:29 EDT Ventricular Rate:  103 PR Interval:    QRS Duration: 89 QT Interval:  358 QTC Calculation: 469 R Axis:   -28 Text Interpretation:  Sinus tachycardia Left ventricular hypertrophy Inferior infarct, age indeterminate Probable anterior infarct, age indeterminate T wave inversions anteriorly Confirmed by Thayer Jew [phone]) on 01/22/2017 3:33:06 AM       Radiology Dg Chest 2 View  Result Date: 01/22/2017 CLINICAL DATA:  Acute onset of centralized chest pain and generalized weakness. Shortness of breath. Initial encounter. EXAM: CHEST  2 VIEW COMPARISON:  Chest radiograph performed  11/12/2015 FINDINGS: The lungs are hypoexpanded. Mild vascular congestion is noted. No pleural effusion or pneumothorax is seen. The heart is mildly enlarged. No acute osseous abnormalities identified. Clips are noted within the right upper quadrant, reflecting prior cholecystectomy. IMPRESSION: Lungs hypoexpanded but grossly clear. Mild vascular congestion and mild cardiomegaly noted. Electronically Signed   By: Garald Balding M.D.   On: 01/22/2017 01:49    Procedures Procedures (including critical care time)  Medications Ordered in ED Medications - No data to display   Initial Impression / Assessment and Plan / ED Course  I have reviewed the triage vital signs and the nursing notes.  Pertinent labs & imaging results that were available during my care of the patient were reviewed by me and considered in my medical decision making (see chart for details).     Patient presents for chest pain shortness breath. Onset prior to arrival. Chest pressure has subsided. Reports some chronic dyspnea related to her polymyositis. She is nontoxic. EKG shows some nonspecific T-wave inversions anteriorly which are new when compared to prior. No ST elevation. Initial troponin is negative.  Chest x-ray is reassuring. D-dimer is 0.55. Age-adjusted cut off or be 0.57. Do not feel this is a PE. On recheck, patient remains comfortable. Given her risk factors and heart score of 4, will admit for chest pain rule out.  Final Clinical Impressions(s) / ED Diagnoses   Final diagnoses:  Nonspecific chest pain    New Prescriptions New Prescriptions   No medications on file   I personally performed the services described in this documentation, which was scribed in my presence. The recorded information has been reviewed and is accurate.    Merryl Hacker, MD 01/22/17 [phone]

## 2017-01-22 NOTE — ED Triage Notes (Signed)
Patient comes in for central chest pain with a history of polymyositis.  States that chest is squeezing and hard to take a deep breath.  Patient given 324 ASA and 1 nitro.  Pain decreased after the nitro.  Pain at 4/10 at this time.  No shortness of breath.  A&Ox4 at this time.

## 2017-01-22 NOTE — Consult Note (Signed)
Cardiology Consult    Patient ID: Jessica Burke MRN: [id number], DOB/AGE: April 15, 1961   Admit date: 01/22/2017 Date of Consult: 01/22/2017  Primary Physician: Jani Gravel, MD Primary Cardiologist: New Requesting Provider: Hal Hope Reason for Consultation: Chest pain  Jessica Burke is a 56 y.o. female who is being seen today for the evaluation of chest pain at the request of Dr. Hal Hope.   Patient Profile    56 yo female with PMH of polymyositis, anemia, HTN, MD, and depression who presented with chest discomfort and shortness of breath.   Past Medical History   Past Medical History:  Diagnosis Date  . Anemia   . Arthritis   . Depression    2003  . Hypertension   . 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[place])   . Pneumonia    2016  . Polymyositis ([place])   . Shortness of breath dyspnea     Past Surgical History:  Procedure Laterality Date  . CESAREAN SECTION    . ENDOMETRIAL ABLATION    . LEEP       Allergies  No Known Allergies  History of Present Illness    Jessica Burke is a 56 yo female with PMH of polymyositis, anemia, HTN, MD, and depression. Denies any known family hx of CAD, but reports her father had some type of heart disease. Was dx with polymyositis about 9 years ago, and has struggled over the recent years with chronic pain, and dyspnea. States about 2 years ago she had to move into the downstairs of her house because she was no longer able to walk up the stairs. Denies ever having seen a cardiologist in the past.   Reports developed centralized chest pain that was sharp in nature with radiation down into her abd yesterday evening. States she has had episodes of chest pain in the past but mostly attributes them to her polymyositis. This episode felt somewhat different. Also felt "winded" with this. She told her husband about her symptoms and he called EMS.   In the ED her labs showed stable electrolytes, Trop 0.05 x2, Hgb 11.5. CXR was negative. Ddimer  positive, 0.55. CTA was negative for PE with scattered coronary calcifications. EKG showed SR with new TWI in leads v1-v4.   Inpatient Medications    . aspirin EC  325 mg Oral Daily  . enoxaparin (LOVENOX) injection  40 mg Subcutaneous Daily  . famotidine  20 mg Oral BID  . folic acid  1 mg Oral Daily  . irbesartan  300 mg Oral Daily   And  . hydrochlorothiazide  25 mg Oral Daily  . predniSONE  5 mg Oral QODAY    Family History    Family History  Problem Relation Age of Onset  . Hypertension Father   . Heart disease Father   . Hypertension Mother   . Diabetes Maternal Aunt   . Breast cancer Maternal Aunt     Social History    Social History   Social History  . Marital status: Married    Spouse name: N/A  . Number of children: 1  . Years of education: 81   Occupational History  . retired   . disability    Social History Main Topics  . Smoking status: Former Smoker    Quit date: 12/25/1988  . Smokeless tobacco: Never Used  . Alcohol use No  . Drug use: No  . Sexual activity: Not on file   Other Topics Concern  . Not on file  Social History Narrative   Patient states she has about 4 cups of caffeine daily.   Patient is right handed.      Review of Systems    See HPI All other systems reviewed and are otherwise negative except as noted above.  Physical Exam    Blood pressure (!) 155/98, pulse 93, temperature 97.7 F (36.5 C), temperature source Oral, resp. rate 17, last menstrual period 11/26/2012, SpO2 93 %.  General: Pleasant AAF, NAD Psych: Normal affect. Neuro: Alert and oriented X 3. Moves all extremities spontaneously. HEENT: Normal  Neck: Supple without bruits or JVD. Lungs:  Resp regular and unlabored, CTA. Heart: tachy no s3, s4, or murmurs. Abdomen: Soft, non-tender, non-distended, BS + x 4.  Extremities: No clubbing, cyanosis or edema. DP/PT/Radials 2+ and equal bilaterally.  Labs    Troponin [facility] of Care Test)  Recent Labs   01/22/17 0123  TROPIPOC 0.05    Recent Labs  01/22/17 0637  TROPONINI 0.05*   Lab Results  Component Value Date   WBC 9.7 01/22/2017   HGB 11.0 (L) 01/22/2017   HCT 38.3 01/22/2017   MCV 89.9 01/22/2017   PLT 250 01/22/2017    Recent Labs Lab 01/22/17 0108 01/22/17 0637  NA 142  --   K 3.9  --   CL 105  --   CO2 29  --   BUN 11  --   CREATININE <0.30* <0.30*  CALCIUM 9.0  --   GLUCOSE 121*  --    Lab Results  Component Value Date   CHOL 179 03/17/2013   HDL 44 03/17/2013   LDLCALC 105 (H) 03/17/2013   TRIG 151 (H) 03/17/2013   Lab Results  Component Value Date   DDIMER 0.55 (H) 01/22/2017     Radiology Studies    Dg Chest 2 View  Result Date: 01/22/2017 CLINICAL DATA:  Acute onset of centralized chest pain and generalized weakness. Shortness of breath. Initial encounter. EXAM: CHEST  2 VIEW COMPARISON:  Chest radiograph performed 11/12/2015 FINDINGS: The lungs are hypoexpanded. Mild vascular congestion is noted. No pleural effusion or pneumothorax is seen. The heart is mildly enlarged. No acute osseous abnormalities identified. Clips are noted within the right upper quadrant, reflecting prior cholecystectomy. IMPRESSION: Lungs hypoexpanded but grossly clear. Mild vascular congestion and mild cardiomegaly noted. Electronically Signed   By: Garald Balding M.D.   On: 01/22/2017 01:49   Ct Angio Chest Pe W Or Wo Contrast  Result Date: 01/22/2017 CLINICAL DATA:  Acute onset of generalized chest pain. Initial encounter. EXAM: CT ANGIOGRAPHY CHEST WITH CONTRAST TECHNIQUE: Multidetector CT imaging of the chest was performed using the standard protocol during bolus administration of intravenous contrast. Multiplanar CT image reconstructions and MIPs were obtained to evaluate the vascular anatomy. CONTRAST:  70 mL of Isovue 370 IV contrast COMPARISON:  Chest radiograph performed earlier today at 1:23 a.m. FINDINGS: Cardiovascular: There is no evidence of pulmonary embolus,  though evaluation for pulmonary embolus is somewhat suboptimal in areas of airspace opacity. Scattered coronary artery calcifications are seen. The heart is normal in size. The thoracic aorta is grossly unremarkable. The great vessels are within normal limits. Mediastinum/Nodes: Trace pericardial fluid remains within normal limits. There is a mildly prominent azygoesophageal recess node, measuring 1.2 cm in short axis. The visualized portions of the thyroid gland are unremarkable. No axillary lymphadenopathy is seen. Lungs/Pleura: Bibasilar atelectasis is noted. No pleural effusion or pneumothorax is seen. No masses are identified. Upper Abdomen: A nonspecific 1.6 cm  hypodensity is noted at the medial right hepatic lobe. The patient is status post cholecystectomy, with clips noted at the gallbladder fossa. The visualized portions of the spleen are unremarkable. Musculoskeletal: No acute osseous abnormalities are identified. The visualized musculature is unremarkable in appearance. Review of the MIP images confirms the above findings. IMPRESSION: 1. No evidence of pulmonary embolus. 2. Bibasilar atelectasis noted.  Lungs otherwise clear. 3. Scattered coronary artery calcifications seen. 4. Nonspecific mildly prominent azygoesophageal recess node, measuring 1.2 cm in short axis. 5. Nonspecific 1.6 cm hypodensity at the medial right hepatic lobe. Electronically Signed   By: Garald Balding M.D.   On: 01/22/2017 06:24    ECG & Cardiac Imaging    EKG: SR with new TWI in lead v1-v4  Echo: 7/14  Study Conclusions  - Left ventricle: The cavity size was normal. There was mild concentric hypertrophy. Systolic function was normal. The estimated ejection fraction was in the range of 60% to 65%. Wall motion was normal; there were no regional wall motion abnormalities. Doppler parameters are consistent with abnormal left ventricular relaxation (grade 1 diastolic dysfunction). - Aortic valve: Trivial  regurgitation.  Assessment & Plan    56 yo female with PMH of polymyositis, anemia, HTN, MD, and depression who presented with chest discomfort and shortness of breath.   1. Atypical Chest pain: Reports having chronic pain, but this episode felt different last night. Ddimer was mildly elevated, but CTA negative for PE. Trop 0.05 x2. Does have risk factors of HTN, and remote tobacco use. States she thinks her father had some kind of heart disease but unsure of what specifically.  -- given RF and new TWI on EKG will plan for Lexiscan myoview today.  -- echo pending -- check lipids, and Hgb A1c for risk stratification   2. HTN: Controlled with current therapy  3. Polymyositis: Management per primary   Signed, Reino Bellis, NP-C Pager [phone] 01/22/2017, 9:27 AM   I have personally seen and examined this patient with Reino Bellis, NP. I agree with the assessment and plan as outlined above. She is a very pleasant 56 yo female with history of HTN, DM, polymyositis admitted with chest pain. Troponin with subtle elevation, flat trend (0.05 x 2). She had one episode of pain for one minute. EKG with chronic abnormalities, no clear ischemic changes. I have personally reviewed her EKG.  My exam shows: General: Well developed, well nourished, NAD  HEENT: OP clear, mucus membranes moist  SKIN: warm, dry. No rashes. Neuro: No focal deficits Psychiatric: Mood and affect normal  Neck: No JVD, no carotid bruits, no thyromegaly, no lymphadenopathy.  Lungs:Clear bilaterally, no wheezes, rhonci, crackles Cardiovascular: Regular rate and rhythm with possible gallop. No murmurs or rubs. Abdomen:Soft. Bowel sounds present. Non-tender.  Extremities: No lower extremity edema.  Labs reviewed by me.  Plan: I would recommend a stress test to exclude ischemia given EKG abnormalities, subtle elevation in troponin. Agree with echo to assess LVEF and exclude structural heart disease. We will follow with  you.   Lauree Chandler 01/22/2017 10:19 AM

## 2017-01-22 NOTE — H&P (Signed)
History and Physical    Jessica Burke:[id number] DOB: 1960-08-29 DOA: 01/22/2017  PCP: Jani Gravel, MD  Patient coming from: Home.  Chief Complaint: Chest pain.  HPI: Jessica Burke is a 56 y.o. female with history of polymyositis, hypertension presents to the ER with complaints of chest pain. Patient has been having chest pain since yesterday morning with shortness of breath. Chest pain increases with exertion. Gets better with rest. Denies any productive cough fever or chills.    ED Course: In the ER EKG was showing normal sinus rhythm chest x-ray was unremarkable and troponin was negative. D-dimer is mildly elevated for which CT angiogram of the chest was ordered and is pending. Patient is being admitted for further workup of chest pain.  Review of Systems: As per HPI, rest all negative.   Past Medical History:  Diagnosis Date  . Anemia   . Arthritis   . Depression    2003  . Hypertension   . 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[place])   . Pneumonia    2016  . Polymyositis ([place])   . Shortness of breath dyspnea     Past Surgical History:  Procedure Laterality Date  . CESAREAN SECTION    . ENDOMETRIAL ABLATION    . LEEP       reports that she quit smoking about 28 years ago. She has never used smokeless tobacco. She reports that she does not drink alcohol or use drugs.  No Known Allergies  Family History  Problem Relation Age of Onset  . Hypertension Father   . Heart disease Father   . Hypertension Mother   . Diabetes Maternal Aunt   . Breast cancer Maternal Aunt     Prior to Admission medications   Medication Sig Start Date End Date Taking? Authorizing Provider  Abaloparatide (TYMLOS) 3120 MCG/1.56ML SOPN Inject 1.56 mg into the skin daily.   Yes [provider]  Calcium-Vitamin D-Vitamin K (VIACTIV PO) Take 2 Units by mouth daily.   Yes [provider]  Corticotropin (ACTHAR HP IJ) Inject 1 application as directed 2 (two) times a week.   Yes  [provider]  folic acid (FOLVITE) 1 MG tablet Take 1 mg by mouth daily.   Yes [provider]  methotrexate (RHEUMATREX) 2.5 MG tablet Take 10 mg by mouth once a week. Caution:Chemotherapy. Protect from light.   Yes [provider]  oxyCODONE-acetaminophen (PERCOCET) 5-325 MG tablet Take 1-2 tablets by mouth every 4 (four) hours as needed. Patient taking differently: Take 1-2 tablets by mouth every 4 (four) hours as needed for moderate pain or severe pain.  10/12/15  Yes Harris, Abigail, PA-C  predniSONE (DELTASONE) 5 MG tablet Take 5 mg by mouth every other day.    Yes [provider]  ranitidine (ZANTAC) 150 MG tablet Take 150 mg by mouth 2 (two) times daily as needed for heartburn.    Yes [provider]  telmisartan-hydrochlorothiazide (MICARDIS HCT) 80-25 MG tablet Take 1 tablet by mouth daily.   Yes [provider]    Physical Exam: Vitals:   01/22/17 0345 01/22/17 0430 01/22/17 0445 01/22/17 0500  BP: (!) 149/94 125/90 (!) 129/92 128/89  Pulse: 74 84 75 81  Resp: (!) 31 18 (!) 22 (!) 26  Temp:      TempSrc:      SpO2: 99% 99% 97% 99%      Constitutional: Moderately built and nourished. Vitals:   01/22/17 0345 01/22/17 0430 01/22/17 0445 01/22/17  0500  BP: (!) 149/94 125/90 (!) 129/92 128/89  Pulse: 74 84 75 81  Resp: (!) 31 18 (!) 22 (!) 26  Temp:      TempSrc:      SpO2: 99% 99% 97% 99%   Eyes: Anicteric. No pallor. ENMT: No discharge from the ears eyes nose and mouth. Neck: No mass felt. No neck rigidity. Respiratory: No rhonchi or crepitations. Cardiovascular: S1-S2 regular no murmurs appreciated. Abdomen: Soft nontender bowel sounds present. Musculoskeletal: No edema. No joint effusion. Skin: No rash. Skin appears warm. Neurologic: Alert awake oriented to time place and person. Moves all extremities. Psychiatric: Appears normal. Normal affect.   Labs on Admission: I have personally reviewed following labs  and imaging studies  CBC:  Recent Labs Lab 01/22/17 0108  WBC 11.3*  HGB 11.5*  HCT 39.5  MCV 90.8  PLT 628   Basic Metabolic Panel:  Recent Labs Lab 01/22/17 0108  NA 142  K 3.9  CL 105  CO2 29  GLUCOSE 121*  BUN 11  CREATININE <0.30*  CALCIUM 9.0   GFR: CrCl cannot be calculated (This lab value cannot be used to calculate CrCl because it is not a number: <0.30). Liver Function Tests: No results for input(s): AST, ALT, ALKPHOS, BILITOT, PROT, ALBUMIN in the last 168 hours. No results for input(s): LIPASE, AMYLASE in the last 168 hours. No results for input(s): AMMONIA in the last 168 hours. Coagulation Profile: No results for input(s): INR, PROTIME in the last 168 hours. Cardiac Enzymes: No results for input(s): CKTOTAL, CKMB, CKMBINDEX, TROPONINI in the last 168 hours. BNP (last 3 results) No results for input(s): PROBNP in the last 8760 hours. HbA1C: No results for input(s): HGBA1C in the last 72 hours. CBG: No results for input(s): GLUCAP in the last 168 hours. Lipid Profile: No results for input(s): CHOL, HDL, LDLCALC, TRIG, CHOLHDL, LDLDIRECT in the last 72 hours. Thyroid Function Tests: No results for input(s): TSH, T4TOTAL, FREET4, T3FREE, THYROIDAB in the last 72 hours. Anemia Panel: No results for input(s): VITAMINB12, FOLATE, FERRITIN, TIBC, IRON, RETICCTPCT in the last 72 hours. Urine analysis:    Component Value Date/Time   COLORURINE YELLOW 11/12/2015 2342   APPEARANCEUR CLEAR 11/12/2015 2342   LABSPEC 1.002 (L) 11/12/2015 2342   PHURINE 7.0 11/12/2015 2342   GLUCOSEU NEGATIVE 11/12/2015 2342   HGBUR NEGATIVE 11/12/2015 2342   BILIRUBINUR NEGATIVE 11/12/2015 2342   KETONESUR NEGATIVE 11/12/2015 2342   PROTEINUR NEGATIVE 11/12/2015 2342   UROBILINOGEN 0.2 04/17/2015 1145   NITRITE NEGATIVE 11/12/2015 2342   LEUKOCYTESUR NEGATIVE 11/12/2015 2342   Sepsis Labs: @LABRCNTIP (procalcitonin:4,lacticidven:4) )No results found for this or any  previous visit (from the past 240 hour(s)).   Radiological Exams on Admission: Dg Chest 2 View  Result Date: 01/22/2017 CLINICAL DATA:  Acute onset of centralized chest pain and generalized weakness. Shortness of breath. Initial encounter. EXAM: CHEST  2 VIEW COMPARISON:  Chest radiograph performed 11/12/2015 FINDINGS: The lungs are hypoexpanded. Mild vascular congestion is noted. No pleural effusion or pneumothorax is seen. The heart is mildly enlarged. No acute osseous abnormalities identified. Clips are noted within the right upper quadrant, reflecting prior cholecystectomy. IMPRESSION: Lungs hypoexpanded but grossly clear. Mild vascular congestion and mild cardiomegaly noted. Electronically Signed   By: Garald Balding M.D.   On: 01/22/2017 01:49    EKG: Independently reviewed. Normal sinus rhythm.  Assessment/Plan Principal Problem:   Chest pain Active Problems:   HTN (hypertension)   Polymyositis ([place])    1.  Chest pain - given the exertional symptoms we will cycle cardiac markers checked 2-D echo keep patient on aspirin when necessary nitroglycerin and get cardiology consult. Since d-dimer is mildly elevated CT angiogram of the chest has been ordered which is pending. 2. Polymyositis - on prednisone methotrexate and biologicals. I have discussed with pharmacist about dosing. 3. Hypertension - on ARB and hydrochlorothiazide.   DVT prophylaxis: Lovenox. Code Status: Full code.  Family Communication: Discussed with patient.  Disposition Plan: Home.  Consults called: None.  Admission status: Observation.    Rise Patience MD [facility] Pager [phone].  If 7PM-7AM, please contact night-coverage [email or web address] Password [facility]  01/22/2017, 5:57 AM

## 2017-01-22 NOTE — Care Management Obs Status (Signed)
[place] NOTIFICATION   Patient Details  Name: CRISOL MUECKE MRN: [id number] Date of Birth: 1961/06/15   Medicare Observation Status Notification Given:  Yes    Bethena Roys, RN 01/22/2017, 4:20 PM

## 2017-01-23 DIAGNOSIS — I10 Essential (primary) hypertension: Secondary | ICD-10-CM | POA: Diagnosis not present

## 2017-01-23 DIAGNOSIS — M3320 Polymyositis, organ involvement unspecified: Secondary | ICD-10-CM

## 2017-01-23 DIAGNOSIS — R072 Precordial pain: Secondary | ICD-10-CM | POA: Diagnosis not present

## 2017-01-23 LAB — HEMOGLOBIN A1C
Hgb A1c MFr Bld: 5.7 % — ABNORMAL HIGH (ref 4.8–5.6)
Mean Plasma Glucose: 117 mg/dL

## 2017-01-23 LAB — NM MYOCAR MULTI W/SPECT W/WALL MOTION / EF
MPHR: 163 {beats}/min
Peak HR: 126 {beats}/min
Percent HR: 77 %
Rest HR: 111 {beats}/min

## 2017-01-23 MED ORDER — METOPROLOL SUCCINATE ER 25 MG PO TB24: 12.5000 mg | ORAL_TABLET | Freq: Every day | ORAL | 0 refills | Status: DC

## 2017-01-23 MED ORDER — PANTOPRAZOLE SODIUM 40 MG PO TBEC: 40.0000 mg | DELAYED_RELEASE_TABLET | Freq: Every day | ORAL | 0 refills | Status: DC

## 2017-01-23 MED ORDER — METOPROLOL SUCCINATE ER 25 MG PO TB24
12.5000 mg | ORAL_TABLET | Freq: Every day | ORAL | Status: DC
  Administered 2017-01-23: 12.5 mg via ORAL
  Filled 2017-01-23: qty 1

## 2017-01-23 NOTE — Discharge Summary (Signed)
Physician Discharge Summary  Jessica Burke BTD:[id number] DOB: 1960-10-25 DOA: 01/22/2017  PCP: Jani Gravel, MD  Admit date: 01/22/2017 Discharge date: 01/23/2017   Recommendations for Outpatient Follow-Up:   BP check 1 week   Discharge Diagnosis:   Principal Problem:   Chest pain Active Problems:   HTN (hypertension)   Polymyositis [facility])   Discharge disposition:  Home  Discharge Condition: Improved.  Diet recommendation: Low sodium, heart healthy  Wound care: None.   History of Present Illness:   Jessica Burke is a 56 y.o. female with history of polymyositis, hypertension presents to the ER with complaints of chest pain. Patient has been having chest pain since yesterday morning with shortness of breath. Chest pain increases with exertion. Gets better with rest. Denies any productive cough fever or chills.    ED Course: In the ER EKG was showing normal sinus rhythm chest x-ray was unremarkable and troponin was negative. D-dimer is mildly elevated for which CT angiogram of the chest was ordered and is pending. Patient is being admitted for further workup of chest pain.   Hospital Course by Problem:   Chest pain -CTA negative -low risk nuclear medicine study  HTN -added BB  polymyositis -resume home meds    Medical Consultants:    cards   Discharge Exam:   Vitals:   01/22/17 1956 01/23/17 0514  BP: (!) 154/93 (!) 145/90  Pulse: 98 100  Resp: 18 18  Temp: 98.7 F (37.1 C) 97.7 F (36.5 C)   Vitals:   01/22/17 1345 01/22/17 1347 01/22/17 1956 01/23/17 0514  BP: (!) 149/91 (!) 144/98 (!) 154/93 (!) 145/90  Pulse:   98 100  Resp:   18 18  Temp:   98.7 F (37.1 C) 97.7 F (36.5 C)  TempSrc:   Oral Oral  SpO2:   95% 91%  Weight:   80.2 kg (176 lb 11.2 oz)   Height:   5\' 3"  (1.6 m)     Gen:  NAD    The results of significant diagnostics from this hospitalization (including imaging, microbiology, ancillary and laboratory) are  listed below for reference.     Procedures and Diagnostic Studies:   Dg Chest 2 View  Result Date: 01/22/2017 CLINICAL DATA:  Acute onset of centralized chest pain and generalized weakness. Shortness of breath. Initial encounter. EXAM: CHEST  2 VIEW COMPARISON:  Chest radiograph performed 11/12/2015 FINDINGS: The lungs are hypoexpanded. Mild vascular congestion is noted. No pleural effusion or pneumothorax is seen. The heart is mildly enlarged. No acute osseous abnormalities identified. Clips are noted within the right upper quadrant, reflecting prior cholecystectomy. IMPRESSION: Lungs hypoexpanded but grossly clear. Mild vascular congestion and mild cardiomegaly noted. Electronically Signed   By: Garald Balding M.D.   On: 01/22/2017 01:49   Ct Angio Chest Pe W Or Wo Contrast  Result Date: 01/22/2017 CLINICAL DATA:  Acute onset of generalized chest pain. Initial encounter. EXAM: CT ANGIOGRAPHY CHEST WITH CONTRAST TECHNIQUE: Multidetector CT imaging of the chest was performed using the standard protocol during bolus administration of intravenous contrast. Multiplanar CT image reconstructions and MIPs were obtained to evaluate the vascular anatomy. CONTRAST:  70 mL of Isovue 370 IV contrast COMPARISON:  Chest radiograph performed earlier today at 1:23 a.m. FINDINGS: Cardiovascular: There is no evidence of pulmonary embolus, though evaluation for pulmonary embolus is somewhat suboptimal in areas of airspace opacity. Scattered coronary artery calcifications are seen. The heart is normal in size. The thoracic aorta is grossly unremarkable.  The great vessels are within normal limits. Mediastinum/Nodes: Trace pericardial fluid remains within normal limits. There is a mildly prominent azygoesophageal recess node, measuring 1.2 cm in short axis. The visualized portions of the thyroid gland are unremarkable. No axillary lymphadenopathy is seen. Lungs/Pleura: Bibasilar atelectasis is noted. No pleural effusion or  pneumothorax is seen. No masses are identified. Upper Abdomen: A nonspecific 1.6 cm hypodensity is noted at the medial right hepatic lobe. The patient is status post cholecystectomy, with clips noted at the gallbladder fossa. The visualized portions of the spleen are unremarkable. Musculoskeletal: No acute osseous abnormalities are identified. The visualized musculature is unremarkable in appearance. Review of the MIP images confirms the above findings. IMPRESSION: 1. No evidence of pulmonary embolus. 2. Bibasilar atelectasis noted.  Lungs otherwise clear. 3. Scattered coronary artery calcifications seen. 4. Nonspecific mildly prominent azygoesophageal recess node, measuring 1.2 cm in short axis. 5. Nonspecific 1.6 cm hypodensity at the medial right hepatic lobe. Electronically Signed   By: Garald Balding M.D.   On: 01/22/2017 06:24   Nm Myocar Multi W/spect W/wall Motion / Ef  Result Date: 01/23/2017 CLINICAL DATA:  Chest pain. Shortness of breath and dyspnea on exertion. Hypertension. EXAM: MYOCARDIAL IMAGING WITH SPECT (REST AND PHARMACOLOGIC-STRESS) GATED LEFT VENTRICULAR WALL MOTION STUDY LEFT VENTRICULAR EJECTION FRACTION TECHNIQUE: Standard myocardial SPECT imaging was performed after resting intravenous injection of 10 mCi Tc-38m tetrofosmin. Subsequently, intravenous infusion of Lexiscan was performed under the supervision of the Cardiology staff. At peak effect of the drug, 30 mCi Tc-80m tetrofosmin was injected intravenously and standard myocardial SPECT imaging was performed. Quantitative gated imaging was also performed to evaluate left ventricular wall motion, and estimate left ventricular ejection fraction. COMPARISON:  None. FINDINGS: Perfusion: No decreased activity in the left ventricle on stress imaging to suggest reversible ischemia or infarction. Wall Motion: Normal left ventricular wall motion. No left ventricular dilation. Left Ventricular Ejection Fraction: 73 % End diastolic volume 46 ml  End systolic volume 13 ml IMPRESSION: 1. No reversible ischemia or infarction. 2. Normal left ventricular wall motion. 3. Left ventricular ejection fraction 73% 4. Non invasive risk stratification*: Low *2012 Appropriate Use Criteria for Coronary Revascularization Focused Update: J Am Coll Cardiol. 6144;31(5):400-867. http://[email or web address]?[email or web address] Electronically Signed   By: Earle Gell M.D.   On: 01/23/2017 07:30     Labs:   Basic Metabolic Panel:  Recent Labs Lab 01/22/17 0108 01/22/17 0637  NA 142  --   K 3.9  --   CL 105  --   CO2 29  --   GLUCOSE 121*  --   BUN 11  --   CREATININE <0.30* <0.30*  CALCIUM 9.0  --    GFR CrCl cannot be calculated (This lab value cannot be used to calculate CrCl because it is not a number: <0.30). Liver Function Tests: No results for input(s): AST, ALT, ALKPHOS, BILITOT, PROT, ALBUMIN in the last 168 hours. No results for input(s): LIPASE, AMYLASE in the last 168 hours. No results for input(s): AMMONIA in the last 168 hours. Coagulation profile No results for input(s): INR, PROTIME in the last 168 hours.  CBC:  Recent Labs Lab 01/22/17 0108 01/22/17 0637  WBC 11.3* 9.7  HGB 11.5* 11.0*  HCT 39.5 38.3  MCV 90.8 89.9  PLT 244 250   Cardiac Enzymes:  Recent Labs Lab 01/22/17 0637 01/22/17 1942  TROPONINI 0.05* 0.11*   BNP: Invalid input(s): POCBNP CBG: No results for input(s): GLUCAP in the last 168 hours. D-Dimer  Recent Labs  01/22/17 0350  DDIMER 0.55*   Hgb A1c  Recent Labs  01/22/17 1942  HGBA1C 5.7*   Lipid Profile  Recent Labs  01/22/17 1942  CHOL 127  HDL 37*  LDLCALC 60  TRIG 149  CHOLHDL 3.4   Thyroid function studies No results for input(s): TSH, T4TOTAL, T3FREE, THYROIDAB in the last 72 hours.  Invalid input(s): FREET3 Anemia work up No results for input(s): VITAMINB12, FOLATE, FERRITIN, TIBC, IRON, RETICCTPCT in the last 72 hours. Microbiology No results  found for this or any previous visit (from the past 240 hour(s)).   Discharge Instructions:   Discharge Instructions    Diet - low sodium heart healthy    Complete by:  As directed    Increase activity slowly    Complete by:  As directed      Allergies as of 01/23/2017   No Known Allergies     Medication List    TAKE these medications   ACTHAR HP IJ Inject 1 application as directed 2 (two) times a week.   folic acid 1 MG tablet Commonly known as:  FOLVITE Take 1 mg by mouth daily.   methotrexate 2.5 MG tablet Commonly known as:  RHEUMATREX Take 10 mg by mouth once a week. Caution:Chemotherapy. Protect from light.   metoprolol succinate 25 MG 24 hr tablet Commonly known as:  TOPROL-XL Take 0.5 tablets (12.5 mg total) by mouth daily.   oxyCODONE-acetaminophen 5-325 MG tablet Commonly known as:  PERCOCET Take 1-2 tablets by mouth every 4 (four) hours as needed. What changed:  reasons to take this   pantoprazole 40 MG tablet Commonly known as:  PROTONIX Take 1 tablet (40 mg total) by mouth daily. Start taking on:  01/24/2017   predniSONE 5 MG tablet Commonly known as:  DELTASONE Take 5 mg by mouth every other day.   ranitidine 150 MG tablet Commonly known as:  ZANTAC Take 150 mg by mouth 2 (two) times daily as needed for heartburn.   telmisartan-hydrochlorothiazide 80-25 MG tablet Commonly known as:  MICARDIS HCT Take 1 tablet by mouth daily.   TYMLOS 3120 MCG/1.56ML Sopn Generic drug:  Abaloparatide Inject 1.56 mg into the skin daily.   VIACTIV PO Take 2 Units by mouth daily.      Follow-up Information    Jani Gravel, MD Follow up in 1 week(s).   Specialty:  Internal Medicine Contact information: [address] [place] [address] [phone]            Time coordinating discharge: 25 min  Signed:  Treyshon Buchanon Alison Stalling   [facility] 01/23/2017, 11:00 AM

## 2017-01-23 NOTE — Progress Notes (Signed)
Progress Note  Patient Name: Jessica Burke Date of Encounter: 01/23/2017  Primary Cardiologist: Reola Calkins Angelena Form)  Subjective   No further chest pain.   Inpatient Medications    Scheduled Meds: . aspirin EC  325 mg Oral Daily  . enoxaparin (LOVENOX) injection  40 mg Subcutaneous Daily  . famotidine  20 mg Oral BID  . folic acid  1 mg Oral Daily  . irbesartan  300 mg Oral Daily   And  . hydrochlorothiazide  25 mg Oral Daily  . pantoprazole  40 mg Oral Daily  . predniSONE  5 mg Oral QODAY   Continuous Infusions:  PRN Meds: acetaminophen, clonazePAM, nitroGLYCERIN, ondansetron (ZOFRAN) IV, oxyCODONE-acetaminophen   Vital Signs    Vitals:   01/22/17 1345 01/22/17 1347 01/22/17 1956 01/23/17 0514  BP: (!) 149/91 (!) 144/98 (!) 154/93 (!) 145/90  Pulse:   98 100  Resp:   18 18  Temp:   98.7 F (37.1 C) 97.7 F (36.5 C)  TempSrc:   Oral Oral  SpO2:   95% 91%  Weight:   176 lb 11.2 oz (80.2 kg)   Height:   5\' 3"  (1.6 m)     Intake/Output Summary (Last 24 hours) at 01/23/17 1020 Last data filed at 01/22/17 1630  Gross per 24 hour  Intake              240 ml  Output              651 ml  Net             -411 ml   Filed Weights   01/22/17 1956  Weight: 176 lb 11.2 oz (80.2 kg)    Telemetry    ST with PACs/PVCs - Personally Reviewed  ECG    N/a - Personally Reviewed  Physical Exam   General: Well developed, well nourished, female appearing in no acute distress. Head: Normocephalic, atraumatic.  Neck: Supple without bruits, JVD. Lungs:  Resp regular and unlabored, CTA. Heart: Tachy, S1, S2, no S3, S4, or murmur; no rub. Abdomen: Soft, non-tender, non-distended with normoactive bowel sounds. No hepatomegaly. No rebound/guarding. No obvious abdominal masses. Extremities: No clubbing, cyanosis, edema. Distal pedal pulses are 2+ bilaterally. Neuro: Alert and oriented X 3. Moves all extremities spontaneously. Psych: Normal affect.  Labs     Chemistry Recent Labs Lab 01/22/17 0108 01/22/17 0637  NA 142  --   K 3.9  --   CL 105  --   CO2 29  --   GLUCOSE 121*  --   BUN 11  --   CREATININE <0.30* <0.30*  CALCIUM 9.0  --   GFRNONAA NOT CALCULATED NOT CALCULATED  GFRAA NOT CALCULATED NOT CALCULATED  ANIONGAP 8  --      Hematology Recent Labs Lab 01/22/17 0108 01/22/17 0637  WBC 11.3* 9.7  RBC 4.35 4.26  HGB 11.5* 11.0*  HCT 39.5 38.3  MCV 90.8 89.9  MCH 26.4 25.8*  MCHC 29.1* 28.7*  RDW 17.2* 16.9*  PLT 244 250    Cardiac Enzymes Recent Labs Lab 01/22/17 0637 01/22/17 1942  TROPONINI 0.05* 0.11*    Recent Labs Lab 01/22/17 0123  TROPIPOC 0.05     BNP Recent Labs Lab 01/22/17 0108  BNP 76.4     DDimer  Recent Labs Lab 01/22/17 0350  DDIMER 0.55*      Radiology    Dg Chest 2 View  Result Date: 01/22/2017 CLINICAL DATA:  Acute onset of centralized  chest pain and generalized weakness. Shortness of breath. Initial encounter. EXAM: CHEST  2 VIEW COMPARISON:  Chest radiograph performed 11/12/2015 FINDINGS: The lungs are hypoexpanded. Mild vascular congestion is noted. No pleural effusion or pneumothorax is seen. The heart is mildly enlarged. No acute osseous abnormalities identified. Clips are noted within the right upper quadrant, reflecting prior cholecystectomy. IMPRESSION: Lungs hypoexpanded but grossly clear. Mild vascular congestion and mild cardiomegaly noted. Electronically Signed   By: Garald Balding M.D.   On: 01/22/2017 01:49   Ct Angio Chest Pe W Or Wo Contrast  Result Date: 01/22/2017 CLINICAL DATA:  Acute onset of generalized chest pain. Initial encounter. EXAM: CT ANGIOGRAPHY CHEST WITH CONTRAST TECHNIQUE: Multidetector CT imaging of the chest was performed using the standard protocol during bolus administration of intravenous contrast. Multiplanar CT image reconstructions and MIPs were obtained to evaluate the vascular anatomy. CONTRAST:  70 mL of Isovue 370 IV contrast  COMPARISON:  Chest radiograph performed earlier today at 1:23 a.m. FINDINGS: Cardiovascular: There is no evidence of pulmonary embolus, though evaluation for pulmonary embolus is somewhat suboptimal in areas of airspace opacity. Scattered coronary artery calcifications are seen. The heart is normal in size. The thoracic aorta is grossly unremarkable. The great vessels are within normal limits. Mediastinum/Nodes: Trace pericardial fluid remains within normal limits. There is a mildly prominent azygoesophageal recess node, measuring 1.2 cm in short axis. The visualized portions of the thyroid gland are unremarkable. No axillary lymphadenopathy is seen. Lungs/Pleura: Bibasilar atelectasis is noted. No pleural effusion or pneumothorax is seen. No masses are identified. Upper Abdomen: A nonspecific 1.6 cm hypodensity is noted at the medial right hepatic lobe. The patient is status post cholecystectomy, with clips noted at the gallbladder fossa. The visualized portions of the spleen are unremarkable. Musculoskeletal: No acute osseous abnormalities are identified. The visualized musculature is unremarkable in appearance. Review of the MIP images confirms the above findings. IMPRESSION: 1. No evidence of pulmonary embolus. 2. Bibasilar atelectasis noted.  Lungs otherwise clear. 3. Scattered coronary artery calcifications seen. 4. Nonspecific mildly prominent azygoesophageal recess node, measuring 1.2 cm in short axis. 5. Nonspecific 1.6 cm hypodensity at the medial right hepatic lobe. Electronically Signed   By: Garald Balding M.D.   On: 01/22/2017 06:24   Nm Myocar Multi W/spect W/wall Motion / Ef  Result Date: 01/23/2017 CLINICAL DATA:  Chest pain. Shortness of breath and dyspnea on exertion. Hypertension. EXAM: MYOCARDIAL IMAGING WITH SPECT (REST AND PHARMACOLOGIC-STRESS) GATED LEFT VENTRICULAR WALL MOTION STUDY LEFT VENTRICULAR EJECTION FRACTION TECHNIQUE: Standard myocardial SPECT imaging was performed after  resting intravenous injection of 10 mCi Tc-88m tetrofosmin. Subsequently, intravenous infusion of Lexiscan was performed under the supervision of the Cardiology staff. At peak effect of the drug, 30 mCi Tc-57m tetrofosmin was injected intravenously and standard myocardial SPECT imaging was performed. Quantitative gated imaging was also performed to evaluate left ventricular wall motion, and estimate left ventricular ejection fraction. COMPARISON:  None. FINDINGS: Perfusion: No decreased activity in the left ventricle on stress imaging to suggest reversible ischemia or infarction. Wall Motion: Normal left ventricular wall motion. No left ventricular dilation. Left Ventricular Ejection Fraction: 73 % End diastolic volume 46 ml End systolic volume 13 ml IMPRESSION: 1. No reversible ischemia or infarction. 2. Normal left ventricular wall motion. 3. Left ventricular ejection fraction 73% 4. Non invasive risk stratification*: Low *2012 Appropriate Use Criteria for Coronary Revascularization Focused Update: J Am Coll Cardiol. 1607;37(1):062-694. http://content.airportbarriers.com.aspx?articleid=[email or web address] Electronically Signed   By: Earle Gell M.D.   On:  01/23/2017 07:30    Cardiac Studies   Lexiscan: 01/22/17  FINDINGS: Perfusion: No decreased activity in the left ventricle on stress imaging to suggest reversible ischemia or infarction.  Wall Motion: Normal left ventricular wall motion. No left ventricular dilation.  Left Ventricular Ejection Fraction: 73 %  End diastolic volume 46 ml  End systolic volume 13 ml  IMPRESSION: 1. No reversible ischemia or infarction.  2. Normal left ventricular wall motion.  3. Left ventricular ejection fraction 73%  4. Non invasive risk stratification*: Low  TTE: 01/22/17  Study Conclusions  - Left ventricle: The cavity size was normal. Wall thickness was   increased in a pattern of mild LVH. Systolic function was normal.   The estimated ejection  fraction was in the range of 60% to 65%.   Wall motion was normal; there were no regional wall motion   abnormalities. Doppler parameters are consistent with abnormal   left ventricular relaxation (grade 1 diastolic dysfunction). The   E/e&' ratio is <8, suggesting normal LV filling pressure. - Aortic valve: Trileaflet. Sclerosis without stenosis. There was   trivial regurgitation. - Mitral valve: Mildly thickened leaflets . There was trivial   regurgitation. - Left atrium: The atrium was normal in size. - Right atrium: The atrium was normal in size. - Tricuspid valve: There was mild regurgitation. - Pulmonary arteries: PA peak pressure: 45 mm Hg (S). - Inferior vena cava: The vessel was normal in size. The   respirophasic diameter changes were in the normal range (= 50%),   consistent with normal central venous pressure.  Impressions:  - Compared to a prior echo in 2014, the findings are stable. RVSP   is elevated at 45 mmHg - finding consistent with mild to moderate   pulmonary arterial hypertension as left heart filling pressures   appear normal.  Patient Profile     56 y.o. female with PMH of polymyositis, anemia, HTN, MD, and depression who presented with chest discomfort and shortness of breath  Assessment & Plan    1. Atypical Chest pain: No further chest pain. Trop with mild flat trend. Had a low risk myoview yesterday and echo with normal EF, and G1DD. LDL 60, Hgb A1c 5.7.   2. HTN: Controlled with current therapy  3. Polymyositis: Management per primary   4. ST: Noted to be tachycardic on telemetry.  Add low dose metoprolol.   Signed, Reino Bellis, NP  01/23/2017, 10:20 AM    I have personally seen and examined this patient with Reino Bellis, NP. I agree with the assessment and plan as outlined above. Her chest pain is atypical. STress test with no ischemia. Echo with normal LV size and function. No further cardiac workup. Agree with adding low dose  metoprolol given sinus tachy. No PE on CTA chest. OK to d/c home.   Lauree Chandler 01/23/2017 10:48 AM

## 2017-01-23 NOTE — Progress Notes (Signed)
Discharge instructions printed and reviewed with patient and spouse, and copy given for them to take home. All questions addressed at this time. New prescriptions reviewed, and paper scripts given for them to fill after discharge. IV and tele box removed. Room searched for patient belongings, and confirmed with patient that all valuables were accounted for. Spouse assisted patient to dress, then staff escorted patient to discharge via wheelchair.

## 2017-01-23 NOTE — Care Management Note (Signed)
Case Management Note  Patient Details  Name: NATAJAH DERDERIAN MRN: [id number] Date of Birth: 15-Dec-1960  Subjective/Objective:                 Patient with order to DC to home. No CM consults, needs, or orders identified at this time.    Action/Plan:  DC to home. Expected Discharge Date:  01/23/17               Expected Discharge Plan:  Home/Self Care  In-House Referral:     Discharge planning Services  CM Consult  Post Acute Care Choice:    Choice offered to:     DME Arranged:    DME Agency:     HH Arranged:    HH Agency:     Status of Service:  Completed, signed off  If discussed at [employer] of Stay Meetings, dates discussed:    Additional Comments:  Carles Collet, RN 01/23/2017, 11:17 AM

## 2017-01-31 DIAGNOSIS — Z8639 Personal history of other endocrine, nutritional and metabolic disease: Secondary | ICD-10-CM | POA: Diagnosis not present

## 2017-01-31 DIAGNOSIS — R1084 Generalized abdominal pain: Secondary | ICD-10-CM | POA: Diagnosis not present

## 2017-01-31 DIAGNOSIS — R630 Anorexia: Secondary | ICD-10-CM | POA: Diagnosis not present

## 2017-01-31 DIAGNOSIS — R634 Abnormal weight loss: Secondary | ICD-10-CM | POA: Diagnosis not present

## 2017-01-31 DIAGNOSIS — E559 Vitamin D deficiency, unspecified: Secondary | ICD-10-CM | POA: Diagnosis not present

## 2017-01-31 DIAGNOSIS — M3320 Polymyositis, organ involvement unspecified: Secondary | ICD-10-CM | POA: Diagnosis not present

## 2017-02-13 DIAGNOSIS — N329 Bladder disorder, unspecified: Secondary | ICD-10-CM | POA: Diagnosis not present

## 2017-02-13 DIAGNOSIS — D2262 Melanocytic nevi of left upper limb, including shoulder: Secondary | ICD-10-CM | POA: Diagnosis not present

## 2017-02-13 DIAGNOSIS — E669 Obesity, unspecified: Secondary | ICD-10-CM | POA: Diagnosis not present

## 2017-02-13 DIAGNOSIS — I479 Paroxysmal tachycardia, unspecified: Secondary | ICD-10-CM | POA: Diagnosis not present

## 2017-02-13 DIAGNOSIS — I10 Essential (primary) hypertension: Secondary | ICD-10-CM | POA: Diagnosis not present

## 2017-02-13 DIAGNOSIS — Z9181 History of falling: Secondary | ICD-10-CM | POA: Diagnosis not present

## 2017-02-13 DIAGNOSIS — D224 Melanocytic nevi of scalp and neck: Secondary | ICD-10-CM | POA: Diagnosis not present

## 2017-02-13 DIAGNOSIS — M818 Other osteoporosis without current pathological fracture: Secondary | ICD-10-CM | POA: Diagnosis not present

## 2017-02-13 DIAGNOSIS — Z6832 Body mass index (BMI) 32.0-32.9, adult: Secondary | ICD-10-CM | POA: Diagnosis not present

## 2017-02-13 DIAGNOSIS — D2230 Melanocytic nevi of unspecified part of face: Secondary | ICD-10-CM | POA: Diagnosis not present

## 2017-02-13 DIAGNOSIS — D2261 Melanocytic nevi of right upper limb, including shoulder: Secondary | ICD-10-CM | POA: Diagnosis not present

## 2017-02-13 DIAGNOSIS — G710 Muscular dystrophy: Secondary | ICD-10-CM | POA: Diagnosis not present

## 2017-02-13 DIAGNOSIS — G4701 Insomnia due to medical condition: Secondary | ICD-10-CM | POA: Diagnosis not present

## 2017-02-13 DIAGNOSIS — Z0000 Encounter for general adult medical examination without abnormal findings: Secondary | ICD-10-CM | POA: Diagnosis not present

## 2017-02-18 DIAGNOSIS — Z01419 Encounter for gynecological examination (general) (routine) without abnormal findings: Secondary | ICD-10-CM | POA: Diagnosis not present

## 2017-02-18 DIAGNOSIS — Z6830 Body mass index (BMI) 30.0-30.9, adult: Secondary | ICD-10-CM | POA: Diagnosis not present

## 2017-03-05 DIAGNOSIS — M3320 Polymyositis, organ involvement unspecified: Secondary | ICD-10-CM | POA: Diagnosis not present

## 2017-03-14 DIAGNOSIS — M3320 Polymyositis, organ involvement unspecified: Secondary | ICD-10-CM | POA: Diagnosis not present

## 2017-03-14 DIAGNOSIS — G710 Muscular dystrophy: Secondary | ICD-10-CM | POA: Diagnosis not present

## 2017-03-14 DIAGNOSIS — S42209S Unspecified fracture of upper end of unspecified humerus, sequela: Secondary | ICD-10-CM | POA: Diagnosis not present

## 2017-03-14 DIAGNOSIS — M359 Systemic involvement of connective tissue, unspecified: Secondary | ICD-10-CM | POA: Diagnosis not present

## 2017-03-28 DIAGNOSIS — M3320 Polymyositis, organ involvement unspecified: Secondary | ICD-10-CM | POA: Diagnosis not present

## 2017-07-09 DIAGNOSIS — R740 Nonspecific elevation of levels of transaminase and lactic acid dehydrogenase [LDH]: Secondary | ICD-10-CM | POA: Diagnosis not present

## 2017-07-09 DIAGNOSIS — Z6831 Body mass index (BMI) 31.0-31.9, adult: Secondary | ICD-10-CM | POA: Diagnosis not present

## 2017-07-09 DIAGNOSIS — E7800 Pure hypercholesterolemia, unspecified: Secondary | ICD-10-CM | POA: Diagnosis not present

## 2017-07-09 DIAGNOSIS — K210 Gastro-esophageal reflux disease with esophagitis: Secondary | ICD-10-CM | POA: Diagnosis not present

## 2017-07-09 DIAGNOSIS — M3320 Polymyositis, organ involvement unspecified: Secondary | ICD-10-CM | POA: Diagnosis not present

## 2017-07-09 DIAGNOSIS — M810 Age-related osteoporosis without current pathological fracture: Secondary | ICD-10-CM | POA: Diagnosis not present

## 2017-07-09 DIAGNOSIS — E8809 Other disorders of plasma-protein metabolism, not elsewhere classified: Secondary | ICD-10-CM | POA: Diagnosis not present

## 2017-07-09 DIAGNOSIS — M8580 Other specified disorders of bone density and structure, unspecified site: Secondary | ICD-10-CM | POA: Diagnosis not present

## 2017-07-09 DIAGNOSIS — I10 Essential (primary) hypertension: Secondary | ICD-10-CM | POA: Diagnosis not present

## 2017-07-09 DIAGNOSIS — Z0001 Encounter for general adult medical examination with abnormal findings: Secondary | ICD-10-CM | POA: Diagnosis not present

## 2017-07-22 DIAGNOSIS — L820 Inflamed seborrheic keratosis: Secondary | ICD-10-CM | POA: Diagnosis not present

## 2017-07-22 DIAGNOSIS — L218 Other seborrheic dermatitis: Secondary | ICD-10-CM | POA: Diagnosis not present

## 2017-08-04 DIAGNOSIS — I255 Ischemic cardiomyopathy: Secondary | ICD-10-CM | POA: Diagnosis not present

## 2017-08-04 DIAGNOSIS — Z1379 Encounter for other screening for genetic and chromosomal anomalies: Secondary | ICD-10-CM | POA: Diagnosis not present

## 2017-08-04 DIAGNOSIS — R69 Illness, unspecified: Secondary | ICD-10-CM | POA: Diagnosis not present

## 2017-08-05 DIAGNOSIS — Z8042 Family history of malignant neoplasm of prostate: Secondary | ICD-10-CM | POA: Diagnosis not present

## 2017-08-05 DIAGNOSIS — Z1501 Genetic susceptibility to malignant neoplasm of breast: Secondary | ICD-10-CM | POA: Diagnosis not present

## 2017-08-05 DIAGNOSIS — Z86010 Personal history of colonic polyps: Secondary | ICD-10-CM | POA: Diagnosis not present

## 2017-08-05 DIAGNOSIS — Z1371 Encounter for nonprocreative screening for genetic disease carrier status: Secondary | ICD-10-CM | POA: Diagnosis not present

## 2017-08-05 DIAGNOSIS — Z1509 Genetic susceptibility to other malignant neoplasm: Secondary | ICD-10-CM | POA: Diagnosis not present

## 2017-08-05 DIAGNOSIS — K635 Polyp of colon: Secondary | ICD-10-CM | POA: Diagnosis not present

## 2017-10-11 DIAGNOSIS — M810 Age-related osteoporosis without current pathological fracture: Secondary | ICD-10-CM | POA: Diagnosis not present

## 2017-10-11 DIAGNOSIS — R634 Abnormal weight loss: Secondary | ICD-10-CM | POA: Diagnosis not present

## 2017-10-11 DIAGNOSIS — M3320 Polymyositis, organ involvement unspecified: Secondary | ICD-10-CM | POA: Diagnosis not present

## 2017-10-11 DIAGNOSIS — Z79899 Other long term (current) drug therapy: Secondary | ICD-10-CM | POA: Diagnosis not present

## 2017-10-11 DIAGNOSIS — Z7952 Long term (current) use of systemic steroids: Secondary | ICD-10-CM | POA: Diagnosis not present

## 2017-10-23 DIAGNOSIS — M810 Age-related osteoporosis without current pathological fracture: Secondary | ICD-10-CM | POA: Diagnosis not present

## 2017-10-23 DIAGNOSIS — M3320 Polymyositis, organ involvement unspecified: Secondary | ICD-10-CM | POA: Diagnosis not present

## 2017-10-23 DIAGNOSIS — R2689 Other abnormalities of gait and mobility: Secondary | ICD-10-CM | POA: Diagnosis not present

## 2017-10-23 DIAGNOSIS — M359 Systemic involvement of connective tissue, unspecified: Secondary | ICD-10-CM | POA: Diagnosis not present

## 2017-10-23 DIAGNOSIS — S42209S Unspecified fracture of upper end of unspecified humerus, sequela: Secondary | ICD-10-CM | POA: Diagnosis not present

## 2017-10-23 DIAGNOSIS — G7100 Muscular dystrophy, unspecified: Secondary | ICD-10-CM | POA: Diagnosis not present

## 2017-11-07 DIAGNOSIS — M3320 Polymyositis, organ involvement unspecified: Secondary | ICD-10-CM | POA: Diagnosis not present

## 2017-11-20 DIAGNOSIS — M359 Systemic involvement of connective tissue, unspecified: Secondary | ICD-10-CM | POA: Diagnosis not present

## 2017-11-20 DIAGNOSIS — M810 Age-related osteoporosis without current pathological fracture: Secondary | ICD-10-CM | POA: Diagnosis not present

## 2017-11-20 DIAGNOSIS — M3320 Polymyositis, organ involvement unspecified: Secondary | ICD-10-CM | POA: Diagnosis not present

## 2017-11-20 DIAGNOSIS — S42209S Unspecified fracture of upper end of unspecified humerus, sequela: Secondary | ICD-10-CM | POA: Diagnosis not present

## 2017-11-20 DIAGNOSIS — G7100 Muscular dystrophy, unspecified: Secondary | ICD-10-CM | POA: Diagnosis not present

## 2017-11-20 DIAGNOSIS — R2689 Other abnormalities of gait and mobility: Secondary | ICD-10-CM | POA: Diagnosis not present

## 2017-11-25 DIAGNOSIS — M3320 Polymyositis, organ involvement unspecified: Secondary | ICD-10-CM | POA: Diagnosis not present

## 2017-12-18 DIAGNOSIS — I10 Essential (primary) hypertension: Secondary | ICD-10-CM | POA: Diagnosis not present

## 2017-12-18 DIAGNOSIS — M3320 Polymyositis, organ involvement unspecified: Secondary | ICD-10-CM | POA: Diagnosis not present

## 2017-12-21 DIAGNOSIS — M359 Systemic involvement of connective tissue, unspecified: Secondary | ICD-10-CM | POA: Diagnosis not present

## 2017-12-21 DIAGNOSIS — M3320 Polymyositis, organ involvement unspecified: Secondary | ICD-10-CM | POA: Diagnosis not present

## 2017-12-21 DIAGNOSIS — G7100 Muscular dystrophy, unspecified: Secondary | ICD-10-CM | POA: Diagnosis not present

## 2017-12-21 DIAGNOSIS — M810 Age-related osteoporosis without current pathological fracture: Secondary | ICD-10-CM | POA: Diagnosis not present

## 2017-12-21 DIAGNOSIS — S42209S Unspecified fracture of upper end of unspecified humerus, sequela: Secondary | ICD-10-CM | POA: Diagnosis not present

## 2017-12-21 DIAGNOSIS — R2689 Other abnormalities of gait and mobility: Secondary | ICD-10-CM | POA: Diagnosis not present

## 2018-01-16 DIAGNOSIS — Z7952 Long term (current) use of systemic steroids: Secondary | ICD-10-CM | POA: Diagnosis not present

## 2018-01-16 DIAGNOSIS — M810 Age-related osteoporosis without current pathological fracture: Secondary | ICD-10-CM | POA: Diagnosis not present

## 2018-01-16 DIAGNOSIS — R634 Abnormal weight loss: Secondary | ICD-10-CM | POA: Diagnosis not present

## 2018-01-16 DIAGNOSIS — Z79899 Other long term (current) drug therapy: Secondary | ICD-10-CM | POA: Diagnosis not present

## 2018-01-16 DIAGNOSIS — M3320 Polymyositis, organ involvement unspecified: Secondary | ICD-10-CM | POA: Diagnosis not present

## 2018-01-20 DIAGNOSIS — G7100 Muscular dystrophy, unspecified: Secondary | ICD-10-CM | POA: Diagnosis not present

## 2018-01-20 DIAGNOSIS — M3320 Polymyositis, organ involvement unspecified: Secondary | ICD-10-CM | POA: Diagnosis not present

## 2018-01-20 DIAGNOSIS — S42209S Unspecified fracture of upper end of unspecified humerus, sequela: Secondary | ICD-10-CM | POA: Diagnosis not present

## 2018-01-20 DIAGNOSIS — M359 Systemic involvement of connective tissue, unspecified: Secondary | ICD-10-CM | POA: Diagnosis not present

## 2018-01-20 DIAGNOSIS — M810 Age-related osteoporosis without current pathological fracture: Secondary | ICD-10-CM | POA: Diagnosis not present

## 2018-01-20 DIAGNOSIS — R2689 Other abnormalities of gait and mobility: Secondary | ICD-10-CM | POA: Diagnosis not present

## 2018-02-20 DIAGNOSIS — S42209S Unspecified fracture of upper end of unspecified humerus, sequela: Secondary | ICD-10-CM | POA: Diagnosis not present

## 2018-02-20 DIAGNOSIS — M359 Systemic involvement of connective tissue, unspecified: Secondary | ICD-10-CM | POA: Diagnosis not present

## 2018-02-20 DIAGNOSIS — M810 Age-related osteoporosis without current pathological fracture: Secondary | ICD-10-CM | POA: Diagnosis not present

## 2018-02-20 DIAGNOSIS — R2689 Other abnormalities of gait and mobility: Secondary | ICD-10-CM | POA: Diagnosis not present

## 2018-02-20 DIAGNOSIS — G7100 Muscular dystrophy, unspecified: Secondary | ICD-10-CM | POA: Diagnosis not present

## 2018-02-20 DIAGNOSIS — M3320 Polymyositis, organ involvement unspecified: Secondary | ICD-10-CM | POA: Diagnosis not present

## 2018-03-22 DIAGNOSIS — M359 Systemic involvement of connective tissue, unspecified: Secondary | ICD-10-CM | POA: Diagnosis not present

## 2018-03-22 DIAGNOSIS — S42209S Unspecified fracture of upper end of unspecified humerus, sequela: Secondary | ICD-10-CM | POA: Diagnosis not present

## 2018-03-22 DIAGNOSIS — G7100 Muscular dystrophy, unspecified: Secondary | ICD-10-CM | POA: Diagnosis not present

## 2018-03-22 DIAGNOSIS — R2689 Other abnormalities of gait and mobility: Secondary | ICD-10-CM | POA: Diagnosis not present

## 2018-03-22 DIAGNOSIS — M3320 Polymyositis, organ involvement unspecified: Secondary | ICD-10-CM | POA: Diagnosis not present

## 2018-03-22 DIAGNOSIS — M810 Age-related osteoporosis without current pathological fracture: Secondary | ICD-10-CM | POA: Diagnosis not present

## 2018-04-01 DIAGNOSIS — M3320 Polymyositis, organ involvement unspecified: Secondary | ICD-10-CM | POA: Diagnosis not present

## 2018-04-14 DIAGNOSIS — R634 Abnormal weight loss: Secondary | ICD-10-CM | POA: Diagnosis not present

## 2018-04-14 DIAGNOSIS — M810 Age-related osteoporosis without current pathological fracture: Secondary | ICD-10-CM | POA: Diagnosis not present

## 2018-04-14 DIAGNOSIS — Z79899 Other long term (current) drug therapy: Secondary | ICD-10-CM | POA: Diagnosis not present

## 2018-04-14 DIAGNOSIS — Z7952 Long term (current) use of systemic steroids: Secondary | ICD-10-CM | POA: Diagnosis not present

## 2018-04-14 DIAGNOSIS — M3320 Polymyositis, organ involvement unspecified: Secondary | ICD-10-CM | POA: Diagnosis not present

## 2018-04-18 DIAGNOSIS — M3320 Polymyositis, organ involvement unspecified: Secondary | ICD-10-CM | POA: Diagnosis not present

## 2018-04-22 DIAGNOSIS — M810 Age-related osteoporosis without current pathological fracture: Secondary | ICD-10-CM | POA: Diagnosis not present

## 2018-04-22 DIAGNOSIS — G7100 Muscular dystrophy, unspecified: Secondary | ICD-10-CM | POA: Diagnosis not present

## 2018-04-22 DIAGNOSIS — M3320 Polymyositis, organ involvement unspecified: Secondary | ICD-10-CM | POA: Diagnosis not present

## 2018-04-22 DIAGNOSIS — M359 Systemic involvement of connective tissue, unspecified: Secondary | ICD-10-CM | POA: Diagnosis not present

## 2018-04-22 DIAGNOSIS — S42209S Unspecified fracture of upper end of unspecified humerus, sequela: Secondary | ICD-10-CM | POA: Diagnosis not present

## 2018-04-22 DIAGNOSIS — R2689 Other abnormalities of gait and mobility: Secondary | ICD-10-CM | POA: Diagnosis not present

## 2018-05-23 DIAGNOSIS — G7100 Muscular dystrophy, unspecified: Secondary | ICD-10-CM | POA: Diagnosis not present

## 2018-05-23 DIAGNOSIS — M3320 Polymyositis, organ involvement unspecified: Secondary | ICD-10-CM | POA: Diagnosis not present

## 2018-05-23 DIAGNOSIS — M359 Systemic involvement of connective tissue, unspecified: Secondary | ICD-10-CM | POA: Diagnosis not present

## 2018-05-23 DIAGNOSIS — M810 Age-related osteoporosis without current pathological fracture: Secondary | ICD-10-CM | POA: Diagnosis not present

## 2018-05-23 DIAGNOSIS — R2689 Other abnormalities of gait and mobility: Secondary | ICD-10-CM | POA: Diagnosis not present

## 2018-05-23 DIAGNOSIS — S42209S Unspecified fracture of upper end of unspecified humerus, sequela: Secondary | ICD-10-CM | POA: Diagnosis not present

## 2018-06-22 DIAGNOSIS — G7100 Muscular dystrophy, unspecified: Secondary | ICD-10-CM | POA: Diagnosis not present

## 2018-06-22 DIAGNOSIS — S42209S Unspecified fracture of upper end of unspecified humerus, sequela: Secondary | ICD-10-CM | POA: Diagnosis not present

## 2018-06-22 DIAGNOSIS — M359 Systemic involvement of connective tissue, unspecified: Secondary | ICD-10-CM | POA: Diagnosis not present

## 2018-06-22 DIAGNOSIS — M810 Age-related osteoporosis without current pathological fracture: Secondary | ICD-10-CM | POA: Diagnosis not present

## 2018-06-22 DIAGNOSIS — R2689 Other abnormalities of gait and mobility: Secondary | ICD-10-CM | POA: Diagnosis not present

## 2018-06-22 DIAGNOSIS — M3320 Polymyositis, organ involvement unspecified: Secondary | ICD-10-CM | POA: Diagnosis not present

## 2018-07-15 DIAGNOSIS — R634 Abnormal weight loss: Secondary | ICD-10-CM | POA: Diagnosis not present

## 2018-07-15 DIAGNOSIS — M3320 Polymyositis, organ involvement unspecified: Secondary | ICD-10-CM | POA: Diagnosis not present

## 2018-07-15 DIAGNOSIS — Z79899 Other long term (current) drug therapy: Secondary | ICD-10-CM | POA: Diagnosis not present

## 2018-07-15 DIAGNOSIS — M810 Age-related osteoporosis without current pathological fracture: Secondary | ICD-10-CM | POA: Diagnosis not present

## 2018-07-15 DIAGNOSIS — E7800 Pure hypercholesterolemia, unspecified: Secondary | ICD-10-CM | POA: Diagnosis not present

## 2018-07-15 DIAGNOSIS — Z1159 Encounter for screening for other viral diseases: Secondary | ICD-10-CM | POA: Diagnosis not present

## 2018-07-15 DIAGNOSIS — Z7952 Long term (current) use of systemic steroids: Secondary | ICD-10-CM | POA: Diagnosis not present

## 2018-07-16 DIAGNOSIS — E8809 Other disorders of plasma-protein metabolism, not elsewhere classified: Secondary | ICD-10-CM | POA: Diagnosis not present

## 2018-07-16 DIAGNOSIS — R5382 Chronic fatigue, unspecified: Secondary | ICD-10-CM | POA: Diagnosis not present

## 2018-07-16 DIAGNOSIS — M810 Age-related osteoporosis without current pathological fracture: Secondary | ICD-10-CM | POA: Diagnosis not present

## 2018-07-16 DIAGNOSIS — Z0000 Encounter for general adult medical examination without abnormal findings: Secondary | ICD-10-CM | POA: Diagnosis not present

## 2018-07-16 DIAGNOSIS — Z0001 Encounter for general adult medical examination with abnormal findings: Secondary | ICD-10-CM | POA: Diagnosis not present

## 2018-07-16 DIAGNOSIS — L820 Inflamed seborrheic keratosis: Secondary | ICD-10-CM | POA: Diagnosis not present

## 2018-07-16 DIAGNOSIS — M3320 Polymyositis, organ involvement unspecified: Secondary | ICD-10-CM | POA: Diagnosis not present

## 2018-07-16 DIAGNOSIS — E7800 Pure hypercholesterolemia, unspecified: Secondary | ICD-10-CM | POA: Diagnosis not present

## 2018-07-16 DIAGNOSIS — Z23 Encounter for immunization: Secondary | ICD-10-CM | POA: Diagnosis not present

## 2018-07-16 DIAGNOSIS — I10 Essential (primary) hypertension: Secondary | ICD-10-CM | POA: Diagnosis not present

## 2018-07-23 DIAGNOSIS — M359 Systemic involvement of connective tissue, unspecified: Secondary | ICD-10-CM | POA: Diagnosis not present

## 2018-07-23 DIAGNOSIS — M810 Age-related osteoporosis without current pathological fracture: Secondary | ICD-10-CM | POA: Diagnosis not present

## 2018-07-23 DIAGNOSIS — G7100 Muscular dystrophy, unspecified: Secondary | ICD-10-CM | POA: Diagnosis not present

## 2018-07-23 DIAGNOSIS — R2689 Other abnormalities of gait and mobility: Secondary | ICD-10-CM | POA: Diagnosis not present

## 2018-07-23 DIAGNOSIS — M3320 Polymyositis, organ involvement unspecified: Secondary | ICD-10-CM | POA: Diagnosis not present

## 2018-07-23 DIAGNOSIS — S42209S Unspecified fracture of upper end of unspecified humerus, sequela: Secondary | ICD-10-CM | POA: Diagnosis not present

## 2018-08-21 DIAGNOSIS — Z1211 Encounter for screening for malignant neoplasm of colon: Secondary | ICD-10-CM | POA: Diagnosis not present

## 2018-08-21 DIAGNOSIS — Z1212 Encounter for screening for malignant neoplasm of rectum: Secondary | ICD-10-CM | POA: Diagnosis not present

## 2018-08-22 DIAGNOSIS — S42209S Unspecified fracture of upper end of unspecified humerus, sequela: Secondary | ICD-10-CM | POA: Diagnosis not present

## 2018-08-22 DIAGNOSIS — R2689 Other abnormalities of gait and mobility: Secondary | ICD-10-CM | POA: Diagnosis not present

## 2018-08-22 DIAGNOSIS — G7100 Muscular dystrophy, unspecified: Secondary | ICD-10-CM | POA: Diagnosis not present

## 2018-08-22 DIAGNOSIS — M3320 Polymyositis, organ involvement unspecified: Secondary | ICD-10-CM | POA: Diagnosis not present

## 2018-08-22 DIAGNOSIS — M359 Systemic involvement of connective tissue, unspecified: Secondary | ICD-10-CM | POA: Diagnosis not present

## 2018-08-22 DIAGNOSIS — M810 Age-related osteoporosis without current pathological fracture: Secondary | ICD-10-CM | POA: Diagnosis not present

## 2018-08-25 DIAGNOSIS — M3320 Polymyositis, organ involvement unspecified: Secondary | ICD-10-CM | POA: Diagnosis not present

## 2018-09-10 DIAGNOSIS — M3320 Polymyositis, organ involvement unspecified: Secondary | ICD-10-CM | POA: Diagnosis not present

## 2018-10-03 DIAGNOSIS — I10 Essential (primary) hypertension: Secondary | ICD-10-CM | POA: Diagnosis not present

## 2018-10-03 DIAGNOSIS — E559 Vitamin D deficiency, unspecified: Secondary | ICD-10-CM | POA: Diagnosis not present

## 2018-10-03 DIAGNOSIS — M810 Age-related osteoporosis without current pathological fracture: Secondary | ICD-10-CM | POA: Diagnosis not present

## 2019-02-16 DIAGNOSIS — M3320 Polymyositis, organ involvement unspecified: Secondary | ICD-10-CM | POA: Diagnosis not present

## 2019-03-02 DIAGNOSIS — Z20828 Contact with and (suspected) exposure to other viral communicable diseases: Secondary | ICD-10-CM | POA: Diagnosis not present

## 2019-03-26 DIAGNOSIS — M3320 Polymyositis, organ involvement unspecified: Secondary | ICD-10-CM | POA: Diagnosis not present

## 2019-04-16 DIAGNOSIS — E785 Hyperlipidemia, unspecified: Secondary | ICD-10-CM | POA: Diagnosis not present

## 2019-04-16 DIAGNOSIS — Z7952 Long term (current) use of systemic steroids: Secondary | ICD-10-CM | POA: Diagnosis not present

## 2019-04-16 DIAGNOSIS — M810 Age-related osteoporosis without current pathological fracture: Secondary | ICD-10-CM | POA: Diagnosis not present

## 2019-04-16 DIAGNOSIS — I10 Essential (primary) hypertension: Secondary | ICD-10-CM | POA: Diagnosis not present

## 2019-04-16 DIAGNOSIS — Z79899 Other long term (current) drug therapy: Secondary | ICD-10-CM | POA: Diagnosis not present

## 2019-04-16 DIAGNOSIS — M3320 Polymyositis, organ involvement unspecified: Secondary | ICD-10-CM | POA: Diagnosis not present

## 2019-04-29 ENCOUNTER — Other Ambulatory Visit: Payer: Self-pay

## 2019-04-29 ENCOUNTER — Encounter (HOSPITAL_COMMUNITY): Payer: Self-pay

## 2019-04-29 ENCOUNTER — Emergency Department (HOSPITAL_COMMUNITY)
Admission: EM | Admit: 2019-04-29 | Discharge: 2019-04-30 | Disposition: A | Discharge status: Home / Self Care | Payer: Medicare HMO | Attending: Emergency Medicine | Admitting: Emergency Medicine

## 2019-04-29 DIAGNOSIS — R000 Tachycardia, unspecified: Secondary | ICD-10-CM | POA: Diagnosis not present

## 2019-04-29 DIAGNOSIS — R0789 Other chest pain: Secondary | ICD-10-CM | POA: Insufficient documentation

## 2019-04-29 DIAGNOSIS — R0902 Hypoxemia: Secondary | ICD-10-CM | POA: Diagnosis not present

## 2019-04-29 DIAGNOSIS — R079 Chest pain, unspecified: Secondary | ICD-10-CM | POA: Diagnosis not present

## 2019-04-29 DIAGNOSIS — I10 Essential (primary) hypertension: Secondary | ICD-10-CM | POA: Diagnosis not present

## 2019-04-29 DIAGNOSIS — Z87891 Personal history of nicotine dependence: Secondary | ICD-10-CM | POA: Insufficient documentation

## 2019-04-29 DIAGNOSIS — M069 Rheumatoid arthritis, unspecified: Secondary | ICD-10-CM | POA: Diagnosis not present

## 2019-04-29 DIAGNOSIS — R110 Nausea: Secondary | ICD-10-CM | POA: Diagnosis not present

## 2019-04-29 DIAGNOSIS — R197 Diarrhea, unspecified: Secondary | ICD-10-CM | POA: Diagnosis not present

## 2019-04-29 LAB — CBC WITH DIFFERENTIAL/PLATELET
Abs Immature Granulocytes: 0.06 10*3/uL (ref 0.00–0.07)
Basophils Absolute: 0.1 10*3/uL (ref 0.0–0.1)
Basophils Relative: 0 %
Eosinophils Absolute: 0.2 10*3/uL (ref 0.0–0.5)
Eosinophils Relative: 1 %
HCT: 42.7 % (ref 36.0–46.0)
Hemoglobin: 12.6 g/dL (ref 12.0–15.0)
Immature Granulocytes: 0 %
Lymphocytes Relative: 14 %
Lymphs Abs: 2 10*3/uL (ref 0.7–4.0)
MCH: 27.3 pg (ref 26.0–34.0)
MCHC: 29.5 g/dL — ABNORMAL LOW (ref 30.0–36.0)
MCV: 92.4 fL (ref 80.0–100.0)
Monocytes Absolute: 0.9 10*3/uL (ref 0.1–1.0)
Monocytes Relative: 6 %
Neutro Abs: 11.3 10*3/uL — ABNORMAL HIGH (ref 1.7–7.7)
Neutrophils Relative %: 79 %
Platelets: 255 10*3/uL (ref 150–400)
RBC: 4.62 MIL/uL (ref 3.87–5.11)
RDW: 14.6 % (ref 11.5–15.5)
WBC: 14.4 10*3/uL — ABNORMAL HIGH (ref 4.0–10.5)
nRBC: 0 % (ref 0.0–0.2)

## 2019-04-29 MED ORDER — FENTANYL CITRATE (PF) 100 MCG/2ML IJ SOLN
50.0000 ug | Freq: Once | INTRAMUSCULAR | Status: DC
Start: 2019-04-29 — End: 2019-04-30
  Filled 2019-04-29: qty 2

## 2019-04-29 NOTE — ED Triage Notes (Signed)
Pt BIB [facility] for eval of CP onset 6 hours PTA w/ associated nausea. Pt reports pain feels "like a cramp" in pecs and over clavicle. Hx of rheumatoid arthritis, states she feels this may be related to that. Pt also reports diarrhea x 3 days. Took 324 ASA at home, 1 SLNTG w/ EMS and pt reports relief of pain. Sinus tach on 12 lead, otherwise unremarkable #20G to L AC. GCS 15, CAOx4.

## 2019-04-30 ENCOUNTER — Emergency Department (HOSPITAL_COMMUNITY): Payer: Medicare HMO

## 2019-04-30 DIAGNOSIS — R110 Nausea: Secondary | ICD-10-CM | POA: Diagnosis not present

## 2019-04-30 DIAGNOSIS — R079 Chest pain, unspecified: Secondary | ICD-10-CM | POA: Diagnosis not present

## 2019-04-30 LAB — COMPREHENSIVE METABOLIC PANEL
ALT: 17 U/L (ref 0–44)
AST: 20 U/L (ref 15–41)
Albumin: 3.4 g/dL — ABNORMAL LOW (ref 3.5–5.0)
Alkaline Phosphatase: 103 U/L (ref 38–126)
Anion gap: 12 (ref 5–15)
BUN: 11 mg/dL (ref 6–20)
CO2: 28 mmol/L (ref 22–32)
Calcium: 9.6 mg/dL (ref 8.9–10.3)
Chloride: 102 mmol/L (ref 98–111)
Creatinine, Ser: 0.35 mg/dL — ABNORMAL LOW (ref 0.44–1.00)
GFR calc Af Amer: >60 mL/min (ref >60)
GFR calc non Af Amer: >60 mL/min (ref >60)
Glucose, Bld: 156 mg/dL — ABNORMAL HIGH (ref 70–99)
Potassium: 4.4 mmol/L (ref 3.5–5.1)
Sodium: 142 mmol/L (ref 135–145)
Total Bilirubin: 0.4 mg/dL (ref 0.3–1.2)
Total Protein: 6.6 g/dL (ref 6.5–8.1)

## 2019-04-30 LAB — TROPONIN I (HIGH SENSITIVITY)
Troponin I (High Sensitivity): 5 ng/L (ref <18)
Troponin I (High Sensitivity): 6 ng/L (ref <18)

## 2019-04-30 MED ORDER — IOHEXOL 350 MG/ML SOLN
80.0000 mL | Freq: Once | INTRAVENOUS | Status: AC | PRN
  Administered 2019-04-30: 80 mL via INTRAVENOUS

## 2019-04-30 NOTE — ED Notes (Signed)
Patient transported to CT 

## 2019-04-30 NOTE — ED Provider Notes (Signed)
Emergency Department Provider Note   I have reviewed the triage vital signs and the nursing notes.   HISTORY  Chief Complaint Chest Pain   HPI Jessica Burke is a 58 y.o. female with a history of RA here with pain in left chest, clavicle, shoulder area. Also has had diarrhea for a couple days. S/s improved some with ASA/NTG but is hypertensive.  no h/o same. No recent illnesses. No cough.   No other associated or modifying symptoms.    Past Medical History:  Diagnosis Date  . Anemia   . Arthritis   . Chest pain 12/2016  . Depression    2003  . Hypertension   . 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 Pneumonia    2016  . Polymyositis ([place])   . Polymyositis ([place]) 12/2016  . Shortness of breath dyspnea     Patient Active Problem List   Diagnosis Date Noted  . CAP (community acquired pneumonia)   . Polymyositis ([place]) 08/03/2015  . Acute respiratory failure ([place]) 11/21/2014  . Acute hypoxemic respiratory failure ([place]) 11/20/2014  . Influenza A (H1N1) 11/20/2014  . Autoimmune disease ([place]) 11/20/2014  . Chest pain 03/17/2013  . Leukocytosis 03/17/2013  . Anemia 03/17/2013  . Hypokalemia 03/17/2013  . HTN (hypertension) 03/17/2013    Past Surgical History:  Procedure Laterality Date  . CESAREAN SECTION    . ENDOMETRIAL ABLATION    . LEEP      Current Outpatient Rx  . Order #: [id number] Class: Historical Med  . Order #: [id number] Class: Historical Med  . Order #: [id number] Class: Historical Med    Allergies Patient has no known allergies.  Family History  Problem Relation Age of Onset  . Hypertension Father   . Heart disease Father   . Hypertension Mother   . Diabetes Maternal Aunt   . Breast cancer Maternal Aunt     Social History Social History   Tobacco Use  . Smoking status: Former Smoker    Quit date: 12/25/1988    Years since quitting: 30.3  . Smokeless tobacco: Never Used  Substance Use Topics  . Alcohol use: No  . Drug use: No     Review of Systems  All other systems negative except as documented in the HPI. All pertinent positives and negatives as reviewed in the HPI. ____________________________________________   PHYSICAL EXAM:  VITAL SIGNS: ED Triage Vitals  Enc Vitals Group     BP 04/29/19 2258 (!) 181/107     Pulse Rate 04/29/19 2258 88     Resp 04/29/19 2258 (!) 30     Temp 04/29/19 2258 98.2 F (36.8 C)     Temp Source 04/29/19 2258 Oral     SpO2 04/29/19 2240 99 %     Weight 04/29/19 2244 164 lb (74.4 kg)     Height 04/29/19 2244 5\' 3"  (1.6 m)    Constitutional: Alert and oriented. Well appearing and in no acute distress. Eyes: Conjunctivae are normal. PERRL. EOMI. Head: Atraumatic. Nose: No congestion/rhinnorhea. Mouth/Throat: Mucous membranes are moist.  Oropharynx non-erythematous. Neck: No stridor.  No meningeal signs.   Cardiovascular: Normal rate, regular rhythm. Good peripheral circulation. Grossly normal heart sounds.   Respiratory: Normal respiratory effort.  No retractions. Lungs CTAB. Gastrointestinal: Soft and nontender. No distention.  Musculoskeletal: No lower extremity tenderness nor edema. No gross deformities of extremities. Neurologic:  Normal speech and language. No gross focal neurologic deficits are appreciated.  Skin:  Skin is warm, dry and intact. No  rash noted.   ____________________________________________   LABS (all labs ordered are listed, but only abnormal results are displayed)  Labs Reviewed  CBC WITH DIFFERENTIAL/PLATELET - Abnormal; Notable for the following components:      Result Value   WBC 14.4 (*)    MCHC 29.5 (*)    Neutro Abs 11.3 (*)    All other components within normal limits  COMPREHENSIVE METABOLIC PANEL - Abnormal; Notable for the following components:   Glucose, Bld 156 (*)    Creatinine, Ser 0.35 (*)    Albumin 3.4 (*)    All other components within normal limits  TROPONIN I (HIGH SENSITIVITY)  TROPONIN I (HIGH SENSITIVITY)    ____________________________________________  EKG   EKG Interpretation  Date/Time:  Wednesday April 29 2019 22:54:38 EDT Ventricular Rate:  106 PR Interval:    QRS Duration: 97 QT Interval:  347 QTC Calculation: 461 R Axis:   45 Text Interpretation:  Sinus tachycardia similar ST changes to may 2018 Confirmed by Merrily Pew [phone]) on 04/29/2019 11:07:50 PM       ____________________________________________  RADIOLOGY  Ct Angio Chest Aorta W And/or Wo Contrast  Result Date: 04/30/2019 CLINICAL DATA:  Chest pain and nausea. Clinical concern for dissection. EXAM: CT ANGIOGRAPHY CHEST WITH CONTRAST TECHNIQUE: Multidetector CT imaging of the chest was performed using the standard protocol during bolus administration of intravenous contrast. Multiplanar CT image reconstructions and MIPs were obtained to evaluate the vascular anatomy. CONTRAST:  15mL OMNIPAQUE IOHEXOL 350 MG/ML SOLN COMPARISON:  Chest CTA 01/22/2017 FINDINGS: Cardiovascular: Normal caliber thoracic aorta. Mild aortic atherosclerosis. No dissection, aneurysm, acute aortic syndrome or aortic hematoma. Conventional branching pattern from the aortic arch. Mild multi chamber cardiomegaly. No pulmonary embolus to the lobar level. Mild coronary artery calcifications. No pericardial effusion. Mediastinum/Nodes: No enlarged mediastinal or hilar lymph nodes. Decompressed esophagus. Visualized thyroid gland is normal. Lungs/Pleura: Hypoventilatory changes with atelectasis at the lung bases. No confluent airspace disease. No evidence of pulmonary edema. No pleural effusion. Calcified granuloma in the right middle lobe. No pneumothorax. Upper Abdomen: Unchanged hypodensity in the left lobe of the liver lead is likely cyst. Ingested material distends the stomach. No acute findings. Musculoskeletal: There are no acute or suspicious osseous abnormalities. Chronic mild anterior wedging of T5 vertebral body. Chronic fatty atrophy of visualized  musculature consistent with muscular dystrophy. Review of the MIP images confirms the above findings. IMPRESSION: 1. No aortic dissection or acute aortic abnormality. Mild aortic atherosclerosis. 2. Mild cardiomegaly and coronary artery calcifications. 3. Hypoventilatory atelectasis. Aortic Atherosclerosis (ICD10-I70.0). Electronically Signed   By: Keith Rake M.D.   On: 04/30/2019 01:58    ____________________________________________   PROCEDURES  Procedure(s) performed:   Procedures   ____________________________________________   INITIAL IMPRESSION / ASSESSMENT AND PLAN / ED COURSE  Likely MSK pain. No e/o dissection/PE/pulmonary issues on CT scan. Pain free in ED. troponins ok, doubt ACS.   Pertinent labs & imaging results that were available during my care of the patient were reviewed by me and considered in my medical decision making (see chart for details).  A medical screening exam was performed and I feel the patient has had an appropriate workup for their chief complaint at this time and likelihood of emergent condition existing is low. They have been counseled on decision, discharge, follow up and which symptoms necessitate immediate return to the emergency department. They or their family verbally stated understanding and agreement with plan and discharged in stable condition.   ____________________________________________  FINAL CLINICAL  IMPRESSION(S) / ED DIAGNOSES  Final diagnoses:  Nonspecific chest pain    MEDICATIONS GIVEN DURING THIS VISIT:  Medications  fentaNYL (SUBLIMAZE) injection 50 mcg (has no administration in time range)  iohexol (OMNIPAQUE) 350 MG/ML injection 80 mL (80 mLs Intravenous Contrast Given 04/30/19 0046)     NEW OUTPATIENT MEDICATIONS STARTED DURING THIS VISIT:  Current Discharge Medication List      Note:  This note was prepared with assistance of Dragon voice recognition software. Occasional wrong-word or sound-a-like  substitutions may have occurred due to the inherent limitations of voice recognition software.   Yonael Tulloch, Corene Cornea, MD 05/02/19 [phone]

## 2019-06-09 DIAGNOSIS — Z23 Encounter for immunization: Secondary | ICD-10-CM | POA: Diagnosis not present

## 2019-06-23 DIAGNOSIS — M810 Age-related osteoporosis without current pathological fracture: Secondary | ICD-10-CM | POA: Diagnosis not present

## 2019-07-17 DIAGNOSIS — I10 Essential (primary) hypertension: Secondary | ICD-10-CM | POA: Diagnosis not present

## 2019-07-17 DIAGNOSIS — E7800 Pure hypercholesterolemia, unspecified: Secondary | ICD-10-CM | POA: Diagnosis not present

## 2019-07-22 DIAGNOSIS — S22000A Wedge compression fracture of unspecified thoracic vertebra, initial encounter for closed fracture: Secondary | ICD-10-CM | POA: Diagnosis not present

## 2019-07-22 DIAGNOSIS — I2510 Atherosclerotic heart disease of native coronary artery without angina pectoris: Secondary | ICD-10-CM | POA: Diagnosis not present

## 2019-07-22 DIAGNOSIS — Z0000 Encounter for general adult medical examination without abnormal findings: Secondary | ICD-10-CM | POA: Diagnosis not present

## 2019-07-22 DIAGNOSIS — E7800 Pure hypercholesterolemia, unspecified: Secondary | ICD-10-CM | POA: Diagnosis not present

## 2019-07-22 DIAGNOSIS — I10 Essential (primary) hypertension: Secondary | ICD-10-CM | POA: Diagnosis not present

## 2019-07-22 DIAGNOSIS — M3320 Polymyositis, organ involvement unspecified: Secondary | ICD-10-CM | POA: Diagnosis not present

## 2019-08-07 DIAGNOSIS — Z87891 Personal history of nicotine dependence: Secondary | ICD-10-CM | POA: Diagnosis not present

## 2019-08-07 DIAGNOSIS — Z7982 Long term (current) use of aspirin: Secondary | ICD-10-CM | POA: Diagnosis not present

## 2019-08-07 DIAGNOSIS — K219 Gastro-esophageal reflux disease without esophagitis: Secondary | ICD-10-CM | POA: Diagnosis not present

## 2019-08-07 DIAGNOSIS — I10 Essential (primary) hypertension: Secondary | ICD-10-CM | POA: Diagnosis not present

## 2019-08-07 DIAGNOSIS — M3320 Polymyositis, organ involvement unspecified: Secondary | ICD-10-CM | POA: Diagnosis not present

## 2019-08-07 DIAGNOSIS — Z79899 Other long term (current) drug therapy: Secondary | ICD-10-CM | POA: Diagnosis not present

## 2019-08-07 DIAGNOSIS — M810 Age-related osteoporosis without current pathological fracture: Secondary | ICD-10-CM | POA: Diagnosis not present

## 2019-08-27 DIAGNOSIS — M3320 Polymyositis, organ involvement unspecified: Secondary | ICD-10-CM | POA: Diagnosis not present

## 2019-09-10 DIAGNOSIS — Z79899 Other long term (current) drug therapy: Secondary | ICD-10-CM | POA: Diagnosis not present

## 2019-09-10 DIAGNOSIS — M3320 Polymyositis, organ involvement unspecified: Secondary | ICD-10-CM | POA: Diagnosis not present

## 2019-09-21 ENCOUNTER — Encounter (HOSPITAL_COMMUNITY): Payer: Self-pay

## 2019-09-21 ENCOUNTER — Other Ambulatory Visit: Payer: Self-pay

## 2019-09-21 ENCOUNTER — Inpatient Hospital Stay (HOSPITAL_COMMUNITY)
Admission: EM | Admit: 2019-09-21 | Discharge: 2019-09-25 | DRG: 493 | Disposition: A | Discharge status: Home Health | Payer: Medicare HMO | Attending: Orthopedic Surgery | Admitting: Orthopedic Surgery

## 2019-09-21 ENCOUNTER — Emergency Department (HOSPITAL_COMMUNITY): Payer: Medicare HMO

## 2019-09-21 DIAGNOSIS — Z20822 Contact with and (suspected) exposure to covid-19: Secondary | ICD-10-CM | POA: Diagnosis present

## 2019-09-21 DIAGNOSIS — J9601 Acute respiratory failure with hypoxia: Secondary | ICD-10-CM | POA: Diagnosis not present

## 2019-09-21 DIAGNOSIS — D649 Anemia, unspecified: Secondary | ICD-10-CM | POA: Diagnosis not present

## 2019-09-21 DIAGNOSIS — Z419 Encounter for procedure for purposes other than remedying health state, unspecified: Secondary | ICD-10-CM

## 2019-09-21 DIAGNOSIS — R402250 Coma scale, best verbal response, oriented, unspecified time: Secondary | ICD-10-CM | POA: Diagnosis present

## 2019-09-21 DIAGNOSIS — Z87891 Personal history of nicotine dependence: Secondary | ICD-10-CM

## 2019-09-21 DIAGNOSIS — Z7401 Bed confinement status: Secondary | ICD-10-CM | POA: Diagnosis not present

## 2019-09-21 DIAGNOSIS — M25062 Hemarthrosis, left knee: Secondary | ICD-10-CM | POA: Diagnosis not present

## 2019-09-21 DIAGNOSIS — M1990 Unspecified osteoarthritis, unspecified site: Secondary | ICD-10-CM | POA: Diagnosis not present

## 2019-09-21 DIAGNOSIS — Z8249 Family history of ischemic heart disease and other diseases of the circulatory system: Secondary | ICD-10-CM

## 2019-09-21 DIAGNOSIS — Z803 Family history of malignant neoplasm of breast: Secondary | ICD-10-CM | POA: Diagnosis not present

## 2019-09-21 DIAGNOSIS — M25561 Pain in right knee: Secondary | ICD-10-CM | POA: Diagnosis present

## 2019-09-21 DIAGNOSIS — S82142A Displaced bicondylar fracture of left tibia, initial encounter for closed fracture: Secondary | ICD-10-CM | POA: Diagnosis not present

## 2019-09-21 DIAGNOSIS — M2550 Pain in unspecified joint: Secondary | ICD-10-CM | POA: Diagnosis not present

## 2019-09-21 DIAGNOSIS — M3320 Polymyositis, organ involvement unspecified: Secondary | ICD-10-CM | POA: Diagnosis not present

## 2019-09-21 DIAGNOSIS — S99911A Unspecified injury of right ankle, initial encounter: Secondary | ICD-10-CM | POA: Diagnosis not present

## 2019-09-21 DIAGNOSIS — M25461 Effusion, right knee: Secondary | ICD-10-CM | POA: Diagnosis not present

## 2019-09-21 DIAGNOSIS — M810 Age-related osteoporosis without current pathological fracture: Secondary | ICD-10-CM | POA: Diagnosis not present

## 2019-09-21 DIAGNOSIS — I10 Essential (primary) hypertension: Secondary | ICD-10-CM | POA: Diagnosis present

## 2019-09-21 DIAGNOSIS — R402360 Coma scale, best motor response, obeys commands, unspecified time: Secondary | ICD-10-CM | POA: Diagnosis present

## 2019-09-21 DIAGNOSIS — S82102A Unspecified fracture of upper end of left tibia, initial encounter for closed fracture: Secondary | ICD-10-CM | POA: Diagnosis present

## 2019-09-21 DIAGNOSIS — Z8701 Personal history of pneumonia (recurrent): Secondary | ICD-10-CM | POA: Diagnosis not present

## 2019-09-21 DIAGNOSIS — M7989 Other specified soft tissue disorders: Secondary | ICD-10-CM | POA: Diagnosis not present

## 2019-09-21 DIAGNOSIS — W010XXA Fall on same level from slipping, tripping and stumbling without subsequent striking against object, initial encounter: Secondary | ICD-10-CM | POA: Diagnosis present

## 2019-09-21 DIAGNOSIS — R269 Unspecified abnormalities of gait and mobility: Secondary | ICD-10-CM | POA: Diagnosis present

## 2019-09-21 DIAGNOSIS — Y92012 Bathroom of single-family (private) house as the place of occurrence of the external cause: Secondary | ICD-10-CM | POA: Diagnosis not present

## 2019-09-21 DIAGNOSIS — M80862A Other osteoporosis with current pathological fracture, left lower leg, initial encounter for fracture: Secondary | ICD-10-CM | POA: Diagnosis not present

## 2019-09-21 DIAGNOSIS — G7100 Muscular dystrophy, unspecified: Secondary | ICD-10-CM | POA: Diagnosis not present

## 2019-09-21 DIAGNOSIS — Z92241 Personal history of systemic steroid therapy: Secondary | ICD-10-CM

## 2019-09-21 DIAGNOSIS — R0902 Hypoxemia: Secondary | ICD-10-CM | POA: Diagnosis not present

## 2019-09-21 DIAGNOSIS — Z993 Dependence on wheelchair: Secondary | ICD-10-CM

## 2019-09-21 DIAGNOSIS — Z03818 Encounter for observation for suspected exposure to other biological agents ruled out: Secondary | ICD-10-CM | POA: Diagnosis not present

## 2019-09-21 DIAGNOSIS — E8889 Other specified metabolic disorders: Secondary | ICD-10-CM | POA: Diagnosis present

## 2019-09-21 DIAGNOSIS — Z79890 Hormone replacement therapy (postmenopausal): Secondary | ICD-10-CM

## 2019-09-21 DIAGNOSIS — M25571 Pain in right ankle and joints of right foot: Secondary | ICD-10-CM | POA: Diagnosis not present

## 2019-09-21 DIAGNOSIS — Z833 Family history of diabetes mellitus: Secondary | ICD-10-CM | POA: Diagnosis not present

## 2019-09-21 DIAGNOSIS — F329 Major depressive disorder, single episode, unspecified: Secondary | ICD-10-CM | POA: Diagnosis present

## 2019-09-21 DIAGNOSIS — R402140 Coma scale, eyes open, spontaneous, unspecified time: Secondary | ICD-10-CM | POA: Diagnosis present

## 2019-09-21 DIAGNOSIS — E559 Vitamin D deficiency, unspecified: Secondary | ICD-10-CM | POA: Diagnosis present

## 2019-09-21 DIAGNOSIS — Z79899 Other long term (current) drug therapy: Secondary | ICD-10-CM | POA: Diagnosis not present

## 2019-09-21 DIAGNOSIS — S82232A Displaced oblique fracture of shaft of left tibia, initial encounter for closed fracture: Secondary | ICD-10-CM | POA: Diagnosis not present

## 2019-09-21 DIAGNOSIS — W19XXXA Unspecified fall, initial encounter: Secondary | ICD-10-CM | POA: Diagnosis not present

## 2019-09-21 DIAGNOSIS — D62 Acute posthemorrhagic anemia: Secondary | ICD-10-CM | POA: Diagnosis not present

## 2019-09-21 DIAGNOSIS — R52 Pain, unspecified: Secondary | ICD-10-CM | POA: Diagnosis not present

## 2019-09-21 DIAGNOSIS — I469 Cardiac arrest, cause unspecified: Secondary | ICD-10-CM | POA: Diagnosis not present

## 2019-09-21 DIAGNOSIS — S82102D Unspecified fracture of upper end of left tibia, subsequent encounter for closed fracture with routine healing: Secondary | ICD-10-CM | POA: Diagnosis not present

## 2019-09-21 DIAGNOSIS — R69 Illness, unspecified: Secondary | ICD-10-CM | POA: Diagnosis not present

## 2019-09-21 HISTORY — DX: Age-related osteoporosis without current pathological fracture: M81.0

## 2019-09-21 LAB — CBC WITH DIFFERENTIAL/PLATELET
Abs Immature Granulocytes: 0.09 10*3/uL — ABNORMAL HIGH (ref 0.00–0.07)
Basophils Absolute: 0 10*3/uL (ref 0.0–0.1)
Basophils Relative: 0 %
Eosinophils Absolute: 0.1 10*3/uL (ref 0.0–0.5)
Eosinophils Relative: 0 %
HCT: 43.6 % (ref 36.0–46.0)
Hemoglobin: 13 g/dL (ref 12.0–15.0)
Immature Granulocytes: 1 %
Lymphocytes Relative: 8 %
Lymphs Abs: 1.5 10*3/uL (ref 0.7–4.0)
MCH: 27.1 pg (ref 26.0–34.0)
MCHC: 29.8 g/dL — ABNORMAL LOW (ref 30.0–36.0)
MCV: 90.8 fL (ref 80.0–100.0)
Monocytes Absolute: 1 10*3/uL (ref 0.1–1.0)
Monocytes Relative: 6 %
Neutro Abs: 15.3 10*3/uL — ABNORMAL HIGH (ref 1.7–7.7)
Neutrophils Relative %: 85 %
Platelets: 242 10*3/uL (ref 150–400)
RBC: 4.8 MIL/uL (ref 3.87–5.11)
RDW: 15.9 % — ABNORMAL HIGH (ref 11.5–15.5)
WBC: 17.9 10*3/uL — ABNORMAL HIGH (ref 4.0–10.5)
nRBC: 0 % (ref 0.0–0.2)

## 2019-09-21 LAB — BASIC METABOLIC PANEL
Anion gap: 12 (ref 5–15)
BUN: 13 mg/dL (ref 6–20)
CO2: 27 mmol/L (ref 22–32)
Calcium: 8.9 mg/dL (ref 8.9–10.3)
Chloride: 100 mmol/L (ref 98–111)
Creatinine, Ser: <0.3 mg/dL — ABNORMAL LOW (ref 0.44–1.00)
Glucose, Bld: 139 mg/dL — ABNORMAL HIGH (ref 70–99)
Potassium: 3.5 mmol/L (ref 3.5–5.1)
Sodium: 139 mmol/L (ref 135–145)

## 2019-09-21 MED ORDER — SODIUM CHLORIDE 0.9 % IV SOLN: INTRAVENOUS | Status: DC

## 2019-09-21 MED ORDER — METHOCARBAMOL 500 MG PO TABS: 500.0000 mg | ORAL_TABLET | Freq: Four times a day (QID) | ORAL | Status: DC | PRN

## 2019-09-21 MED ORDER — DOCUSATE SODIUM 100 MG PO CAPS: 100.0000 mg | ORAL_CAPSULE | Freq: Two times a day (BID) | ORAL | Status: DC

## 2019-09-21 MED ORDER — MORPHINE SULFATE (PF) 2 MG/ML IV SOLN
0.5000 mg | INTRAVENOUS | Status: DC | PRN
  Administered 2019-09-22 (×2): 1 mg via INTRAVENOUS
  Filled 2019-09-21 (×2): qty 1

## 2019-09-21 MED ORDER — METHOCARBAMOL 1000 MG/10ML IJ SOLN
500.0000 mg | Freq: Four times a day (QID) | INTRAVENOUS | Status: DC | PRN
  Filled 2019-09-21: qty 5

## 2019-09-21 MED ORDER — ACETAMINOPHEN 325 MG PO TABS: 325.0000 mg | ORAL_TABLET | Freq: Four times a day (QID) | ORAL | Status: DC | PRN

## 2019-09-21 MED ORDER — HYDROCODONE-ACETAMINOPHEN 5-325 MG PO TABS: 1.0000 | ORAL_TABLET | ORAL | Status: DC | PRN

## 2019-09-21 MED ORDER — POLYETHYLENE GLYCOL 3350 17 G PO PACK: 17.0000 g | PACK | Freq: Every day | ORAL | Status: DC | PRN

## 2019-09-21 MED ORDER — MORPHINE SULFATE (PF) 4 MG/ML IV SOLN
4.0000 mg | Freq: Once | INTRAVENOUS | Status: AC
  Administered 2019-09-22: 01:00:00 4 mg via INTRAVENOUS
  Filled 2019-09-21: qty 1

## 2019-09-21 MED ORDER — HYDROCODONE-ACETAMINOPHEN 7.5-325 MG PO TABS: 1.0000 | ORAL_TABLET | ORAL | Status: DC | PRN

## 2019-09-21 NOTE — ED Notes (Signed)
This RN contacted ortho tech for patient's splint and wrap. Ortho tech is busy with an emergency, but will come to [facility] after she is done.

## 2019-09-21 NOTE — ED Triage Notes (Signed)
Per EMS, pt c/o mechanical fall from bed onto floor. Deformity to left knee. Patient also complains of pain to right ankle. Patient states no LOC, dizziness, or head/neck pain. Patient from home, a&ox4, and normally ambulatory with a walker.

## 2019-09-21 NOTE — ED Triage Notes (Signed)
Patient transported to x-ray. ?

## 2019-09-21 NOTE — H&P (Signed)
Jessica Burke is an 59 y.o. female.    Chief Complaint: Left tibia/fibula fracture   HPI: Pt is a 59 y.o. female complaining of bilateral left greater than right leg pain.  59 year old female who presents after mechanical fall just prior to arrival.  Patient has a history of muscular dystrophy and has trouble ambulating does use a cane normally.  States she lost her balance and fell onto her left knee.  Denies any head or neck trauma from this.  Did not pass out.  Planes of severe sharp pain worse with movement to her left anterior tibia.  Also notes pain to the right knee as well as right ankle.  Was not able to ambulate.  EMS called and patient transported here.  No other significant Ortho complaints this am. Comfortable from pain perspective Some nausea   PCP:  Jani Gravel, MD  D/C Plans: To be determined following appropriate treatment plan  PMH: Past Medical History:  Diagnosis Date  . Anemia   . Arthritis   . Chest pain 12/2016  . Depression    2003  . Hypertension   . 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[place])   . Pneumonia    2016  . Polymyositis ([place])   . 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 Shortness of breath dyspnea     PSH: Past Surgical History:  Procedure Laterality Date  . CESAREAN SECTION    . ENDOMETRIAL ABLATION    . LEEP      Social History:  reports that she quit smoking about 30 years ago. She has never used smokeless tobacco. She reports that she does not drink alcohol or use drugs.  Allergies:  No Known Allergies  Medications: (Not in a hospital admission)   No results found for this or any previous visit (from the past 48 hour(s)). DG Tibia/Fibula Left  Result Date: 09/21/2019 CLINICAL DATA:  Initial evaluation for acute trauma, fall. EXAM: LEFT TIBIA AND FIBULA - 2 VIEW COMPARISON:  None. FINDINGS: Acute oblique fracture extends through the proximal left tibial shaft with 5 mm lateral displacement. Remotely healed fracture of the left fibular neck.  Question faint linear lucency extending through this region, raising the possibility for a an acute on chronic component. No other acute fracture or dislocation about the tibia or fibula. Underlying osteopenia. Soft tissue swelling seen about the proximal leg. IMPRESSION: 1. Acute oblique fracture through the proximal left tibial shaft with mild lateral displacement. 2. Remotely healed fracture of the left fibular neck. Question faint linear lucency extending through this region, raising the possibility for an acute on chronic component. Electronically Signed   By: Jeannine Boga M.D.   On: 09/21/2019 20:53   DG Ankle Complete Right  Result Date: 09/21/2019 CLINICAL DATA:  Initial evaluation for acute trauma, fall. EXAM: RIGHT ANKLE - COMPLETE 3+ VIEW COMPARISON:  None. FINDINGS: No acute fracture or dislocation. Ankle mortise approximated. Talar dome intact. Posterior and plantar calcaneal enthesophytes noted. Underlying osteopenia. Soft tissue swelling seen about the ankle, most notable at the lateral malleolus. IMPRESSION: 1. No acute fracture or dislocation. 2. Soft tissue swelling about the ankle, most notable at the lateral malleolus. Electronically Signed   By: Jeannine Boga M.D.   On: 09/21/2019 20:51   DG Knee Complete 4 Views Left  Result Date: 09/21/2019 CLINICAL DATA:  Initial evaluation for acute trauma, fall. EXAM: LEFT KNEE - COMPLETE 4+ VIEW COMPARISON:  None. FINDINGS: There is an acute oblique fracture extending through the proximal left tibial shaft with  mild 5 mm of lateral displacement. No intra-articular extension. Remotely healed fracture noted at the adjacent fibular neck. Thin linear lucency extending through this region raises the possibility for a superimposed acute component. Distal femur and patella are intact. No joint effusion. Underlying osteopenia. Soft tissue swelling seen surrounding the proximal leg. IMPRESSION: 1. Acute oblique fracture through the proximal  left tibial shaft with mild lateral displacement. 2. Remotely healed fracture of the adjacent left fibular neck. Thin linear lucency extending through this region raises the possibility for a superimposed acute on chronic component. 3. Underlying osteopenia. Electronically Signed   By: Jeannine Boga M.D.   On: 09/21/2019 20:49   DG Knee Complete 4 Views Right  Result Date: 09/21/2019 CLINICAL DATA:  Fall, knee pain EXAM: RIGHT KNEE - COMPLETE 4+ VIEW COMPARISON:  None. FINDINGS: Old healed proximal fibular shaft fracture. Small joint effusion. No acute bony abnormality. Specifically, no acute fracture, subluxation, or dislocation. IMPRESSION: No acute fracture.  Small joint effusion. Electronically Signed   By: Rolm Baptise M.D.   On: 09/21/2019 20:46    ROS: Review of Systems - Negative except for presenting complaints  Review of Systems  All other systems reviewed and are negative. No recent hospitalizations   Physican Exam: Blood pressure (!) 154/101, pulse (!) 109, temperature 98.1 F (36.7 C), temperature source Oral, resp. rate (!) 35, last menstrual period 11/26/2012, SpO2 92 %.  Constitutional:      General: She is not in acute distress.    Appearance: Normal appearance. She is well-developed. She is not toxic-appearing.  HENT:     Head: Normocephalic and atraumatic.  Eyes:     General: Lids are normal.     Conjunctiva/sclera: Conjunctivae normal.     Pupils: Pupils are equal, round, and reactive to light.  Neck:     Thyroid: No thyroid mass.     Trachea: No tracheal deviation.  Cardiovascular:     Rate and Rhythm: Normal rate and regular rhythm.     Heart sounds: Normal heart sounds. No murmur. No gallop.   Pulmonary:     Effort: Pulmonary effort is normal. No respiratory distress.     Breath sounds: Normal breath sounds. No stridor. No decreased breath sounds, wheezing, rhonchi or rales.  Abdominal:     General: Bowel sounds are normal. There is no distension.       Palpations: Abdomen is soft.     Tenderness: There is no abdominal tenderness. There is no rebound.  Musculoskeletal:     Cervical back: Normal range of motion and neck supple.     Left knee: Deformity, effusion and bony tenderness present.     Right ankle: No deformity. Decreased range of motion.     Left ankle: Swelling present. Tenderness present. Decreased range of motion.       Legs:   LLE splint and immobilizer in place     Comments: Neurovascular intact at both feet  Skin:    General: Skin is warm and dry.     Findings: No abrasion or rash.  Neurological:     Mental Status: She is alert and oriented to person, place, and time.     GCS: GCS eye subscore is 4. GCS verbal subscore is 5. GCS motor subscore is 6.     Cranial Nerves: No cranial nerve deficit.     Sensory: No sensory deficit.  Psychiatric:        Speech: Speech normal.        Behavior:  Behavior normal.    Assessment/Plan Assessment: Closed left proximal tibia/fibular fracture with mild displacement Osteoprosis   Plan: Apply long leg splint and knee immobilizer passed on to ER physician to have placed prior to transport to floor Will need ORIF of this fracture for best chance of aligned fracture healing Pain control before and after surgery Hold anticoagulation until better able to determine when case will be performed but apply SCD to RLE  I have initially reached out to [facility] to see if they availability to address her injury during normal hours   Pietro Cassis. Alvan Dame, MD  09/21/2019, 11:20 PM

## 2019-09-21 NOTE — ED Provider Notes (Signed)
[facility] DEPT Provider Note   CSN: [id number] Arrival date & time: 09/21/19  1956     History Chief Complaint  Patient presents with  . Fall    Jessica Burke is a 58 y.o. female.  59 year old female who presents after mechanical fall just prior to arrival.  Patient has a history of muscular dystrophy and has trouble ambulating does use a cane normally.  States she lost her balance and fell onto her left knee.  Denies any head or neck trauma from this.  Did not pass out.  Planes of severe sharp pain worse with movement to her left anterior tibia.  Also notes pain to the right knee as well as right ankle.  Was not able to ambulate.  EMS called and patient transported here.        Past Medical History:  Diagnosis Date  . Anemia   . Arthritis   . Chest pain 12/2016  . Depression    2003  . Hypertension   . 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[place])   . Pneumonia    2016  . Polymyositis ([place])   . 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 Shortness of breath dyspnea     Patient Active Problem List   Diagnosis Date Noted  . CAP (community acquired pneumonia)   . Polymyositis ([place]) 08/03/2015  . Acute respiratory failure ([place]) 11/21/2014  . Acute hypoxemic respiratory failure ([place]) 11/20/2014  . Influenza A (H1N1) 11/20/2014  . Autoimmune disease ([place]) 11/20/2014  . Chest pain 03/17/2013  . Leukocytosis 03/17/2013  . Anemia 03/17/2013  . Hypokalemia 03/17/2013  . HTN (hypertension) 03/17/2013    Past Surgical History:  Procedure Laterality Date  . CESAREAN SECTION    . ENDOMETRIAL ABLATION    . LEEP       OB History    Gravida  1   Para  1   Term      Preterm      AB      Living        SAB      TAB      Ectopic      Multiple      Live Births              Family History  Problem Relation Age of Onset  . Hypertension Father   . Heart disease Father   . Hypertension Mother   . Diabetes Maternal Aunt   . Breast cancer  Maternal Aunt     Social History   Tobacco Use  . Smoking status: Former Smoker    Quit date: 12/25/1988    Years since quitting: 30.7  . Smokeless tobacco: Never Used  Substance Use Topics  . Alcohol use: No  . Drug use: No    Home Medications Prior to Admission medications   Medication Sig Start Date End Date Taking? Authorizing Provider  ACTHAR 80 UNIT/ML injectable gel Inject 80 Units into the skin See admin instructions. Tuesday and Thursday one week and Wednesday and Friday then next week 04/20/19   [provider]  riTUXimab (RITUXAN IV) Inject into the vein every 30 (thirty) days.    [provider]  telmisartan-hydrochlorothiazide (MICARDIS HCT) 80-12.5 MG tablet Take 1 tablet by mouth daily.    [provider]  metoprolol succinate (TOPROL-XL) 25 MG 24 hr tablet Take 0.5 tablets (12.5 mg total) by mouth daily. Patient not taking: Reported on 04/30/2019 01/23/17 04/30/19  Jessica Girt, DO  pantoprazole ([place])  40 MG tablet Take 1 tablet (40 mg total) by mouth daily. Patient not taking: Reported on 04/30/2019 01/24/17 04/30/19  Jessica Girt, DO    Allergies    Patient has no known allergies.  Review of Systems   Review of Systems  All other systems reviewed and are negative.   Physical Exam Updated Vital Signs LMP 11/26/2012   Physical Exam Vitals and nursing note reviewed.  Constitutional:      General: She is not in acute distress.    Appearance: Normal appearance. She is well-developed. She is not toxic-appearing.  HENT:     Head: Normocephalic and atraumatic.  Eyes:     General: Lids are normal.     Conjunctiva/sclera: Conjunctivae normal.     Pupils: Pupils are equal, round, and reactive to light.  Neck:     Thyroid: No thyroid mass.     Trachea: No tracheal deviation.  Cardiovascular:     Rate and Rhythm: Normal rate and regular rhythm.     Heart sounds: Normal heart sounds. No murmur. No gallop.   Pulmonary:     Effort:  Pulmonary effort is normal. No respiratory distress.     Breath sounds: Normal breath sounds. No stridor. No decreased breath sounds, wheezing, rhonchi or rales.  Abdominal:     General: Bowel sounds are normal. There is no distension.     Palpations: Abdomen is soft.     Tenderness: There is no abdominal tenderness. There is no rebound.  Musculoskeletal:     Cervical back: Normal range of motion and neck supple.     Left knee: Deformity, effusion and bony tenderness present.     Right ankle: No deformity. Decreased range of motion.     Left ankle: Swelling present. Tenderness present. Decreased range of motion.       Legs:     Comments: Neurovascular intact at both feet  Skin:    General: Skin is warm and dry.     Findings: No abrasion or rash.  Neurological:     Mental Status: She is alert and oriented to person, place, and time.     GCS: GCS eye subscore is 4. GCS verbal subscore is 5. GCS motor subscore is 6.     Cranial Nerves: No cranial nerve deficit.     Sensory: No sensory deficit.  Psychiatric:        Speech: Speech normal.        Behavior: Behavior normal.     ED Results / Procedures / Treatments   Labs (all labs ordered are listed, but only abnormal results are displayed) Labs Reviewed  CBC WITH DIFFERENTIAL/PLATELET  BASIC METABOLIC PANEL    EKG None  Radiology No results found.  Procedures Procedures (including critical care time)  Medications Ordered in ED Medications  0.9 %  sodium chloride infusion (has no administration in time range)    ED Course  I have reviewed the triage vital signs and the nursing notes.  Pertinent labs & imaging results that were available during my care of the patient were reviewed by me and considered in my medical decision making (see chart for details).    MDM Rules/Calculators/A&P                       Final Clinical Impression(s) / ED Diagnoses Final diagnoses:  None  Patient offered pain medication has  deferred.  Her left knee shows an acute oblique fracture through the proximal left tibial  shaft with some lateral displacement.  Her right knee and right ankle are without evidence of bony injury.  Will consult orthopedics for admission  Rx / DC Orders ED Discharge Orders    None       Lacretia Leigh, MD 09/21/19 2153

## 2019-09-22 ENCOUNTER — Inpatient Hospital Stay (HOSPITAL_COMMUNITY): Payer: Medicare HMO

## 2019-09-22 ENCOUNTER — Encounter (HOSPITAL_COMMUNITY)
Admission: EM | Disposition: A | Discharge status: Home Health | Payer: Self-pay | Source: Home / Self Care | Attending: Orthopedic Surgery

## 2019-09-22 ENCOUNTER — Inpatient Hospital Stay (HOSPITAL_COMMUNITY): Payer: Medicare HMO | Admitting: Anesthesiology

## 2019-09-22 ENCOUNTER — Encounter (HOSPITAL_COMMUNITY): Payer: Self-pay | Admitting: Orthopedic Surgery

## 2019-09-22 HISTORY — PX: ORIF TIBIA FRACTURE: SHX5416

## 2019-09-22 LAB — CREATININE, SERUM: Creatinine, Ser: <0.3 mg/dL — ABNORMAL LOW (ref 0.44–1.00)

## 2019-09-22 LAB — CBC
HCT: 36.6 % (ref 36.0–46.0)
Hemoglobin: 11.1 g/dL — ABNORMAL LOW (ref 12.0–15.0)
MCH: 27.1 pg (ref 26.0–34.0)
MCHC: 30.3 g/dL (ref 30.0–36.0)
MCV: 89.5 fL (ref 80.0–100.0)
Platelets: 214 10*3/uL (ref 150–400)
RBC: 4.09 MIL/uL (ref 3.87–5.11)
RDW: 15.1 % (ref 11.5–15.5)
WBC: 22.6 10*3/uL — ABNORMAL HIGH (ref 4.0–10.5)
nRBC: 0 % (ref 0.0–0.2)

## 2019-09-22 LAB — SURGICAL PCR SCREEN
MRSA, PCR: NEGATIVE
Staphylococcus aureus: POSITIVE — AB

## 2019-09-22 LAB — HIV ANTIBODY (ROUTINE TESTING W REFLEX): HIV Screen 4th Generation wRfx: NONREACTIVE

## 2019-09-22 LAB — RESPIRATORY PANEL BY RT PCR (FLU A&B, COVID)
Influenza A by PCR: NEGATIVE
Influenza B by PCR: NEGATIVE
SARS Coronavirus 2 by RT PCR: NEGATIVE

## 2019-09-22 SURGERY — OPEN REDUCTION INTERNAL FIXATION (ORIF) TIBIA FRACTURE
Anesthesia: General | Site: Leg Upper | Laterality: Left

## 2019-09-22 MED ORDER — FENTANYL CITRATE (PF) 100 MCG/2ML IJ SOLN: INTRAMUSCULAR | Status: DC | PRN

## 2019-09-22 MED ORDER — ONDANSETRON HCL 4 MG/2ML IJ SOLN
INTRAMUSCULAR | Status: AC
  Filled 2019-09-22: qty 2

## 2019-09-22 MED ORDER — ROCURONIUM BROMIDE 10 MG/ML (PF) SYRINGE
PREFILLED_SYRINGE | INTRAVENOUS | Status: DC | PRN
  Administered 2019-09-22: 40 mg via INTRAVENOUS
  Administered 2019-09-22: 10 mg via INTRAVENOUS

## 2019-09-22 MED ORDER — HYDROCODONE-ACETAMINOPHEN 5-325 MG PO TABS: 1.0000 | ORAL_TABLET | ORAL | Status: DC | PRN

## 2019-09-22 MED ORDER — LACTATED RINGERS IV SOLN: INTRAVENOUS | Status: DC

## 2019-09-22 MED ORDER — CHLORHEXIDINE GLUCONATE CLOTH 2 % EX PADS
6.0000 | MEDICATED_PAD | Freq: Every day | CUTANEOUS | Status: DC
  Administered 2019-09-22 – 2019-09-25 (×4): 6 via TOPICAL

## 2019-09-22 MED ORDER — 0.9 % SODIUM CHLORIDE (POUR BTL) OPTIME
TOPICAL | Status: DC | PRN
  Administered 2019-09-22: 1000 mL

## 2019-09-22 MED ORDER — ENOXAPARIN SODIUM 40 MG/0.4ML ~~LOC~~ SOLN
40.0000 mg | SUBCUTANEOUS | Status: DC
  Administered 2019-09-23 – 2019-09-25 (×3): 40 mg via SUBCUTANEOUS
  Filled 2019-09-22 (×3): qty 0.4

## 2019-09-22 MED ORDER — MORPHINE SULFATE (PF) 2 MG/ML IV SOLN
0.5000 mg | INTRAVENOUS | Status: DC | PRN
  Administered 2019-09-22: 18:00:00 1 mg via INTRAVENOUS
  Filled 2019-09-22: qty 1

## 2019-09-22 MED ORDER — ONDANSETRON HCL 4 MG/2ML IJ SOLN: 4.0000 mg | Freq: Four times a day (QID) | INTRAMUSCULAR | Status: DC | PRN

## 2019-09-22 MED ORDER — IRBESARTAN 300 MG PO TABS
300.0000 mg | ORAL_TABLET | Freq: Every day | ORAL | Status: DC
  Administered 2019-09-22 – 2019-09-25 (×3): 300 mg via ORAL
  Filled 2019-09-22 (×3): qty 1

## 2019-09-22 MED ORDER — DOCUSATE SODIUM 100 MG PO CAPS
100.0000 mg | ORAL_CAPSULE | Freq: Two times a day (BID) | ORAL | Status: DC
  Administered 2019-09-22 – 2019-09-25 (×5): 100 mg via ORAL
  Filled 2019-09-22 (×7): qty 1

## 2019-09-22 MED ORDER — CORTICOTROPIN 80 UNIT/ML IJ GEL: 80.0000 [IU] | INTRAMUSCULAR | Status: DC

## 2019-09-22 MED ORDER — PROPOFOL 500 MG/50ML IV EMUL
INTRAVENOUS | Status: DC | PRN
  Administered 2019-09-22: 90 ug/kg/min via INTRAVENOUS
  Administered 2019-09-22: 100 ug/kg/min via INTRAVENOUS

## 2019-09-22 MED ORDER — METOCLOPRAMIDE HCL 5 MG PO TABS: 5.0000 mg | ORAL_TABLET | Freq: Three times a day (TID) | ORAL | Status: DC | PRN

## 2019-09-22 MED ORDER — ACETAMINOPHEN 325 MG PO TABS: 325.0000 mg | ORAL_TABLET | Freq: Four times a day (QID) | ORAL | Status: DC | PRN

## 2019-09-22 MED ORDER — LIDOCAINE 2% (20 MG/ML) 5 ML SYRINGE
INTRAMUSCULAR | Status: AC
  Filled 2019-09-22: qty 5

## 2019-09-22 MED ORDER — LACTATED RINGERS IV SOLN: INTRAVENOUS | Status: DC | PRN

## 2019-09-22 MED ORDER — PROPOFOL 10 MG/ML IV BOLUS
INTRAVENOUS | Status: DC | PRN
  Administered 2019-09-22: 30 mg via INTRAVENOUS
  Administered 2019-09-22: 70 mg via INTRAVENOUS

## 2019-09-22 MED ORDER — MIDAZOLAM HCL 2 MG/2ML IJ SOLN
INTRAMUSCULAR | Status: AC
  Filled 2019-09-22: qty 2

## 2019-09-22 MED ORDER — HYDROCHLOROTHIAZIDE 12.5 MG PO CAPS
12.5000 mg | ORAL_CAPSULE | Freq: Every day | ORAL | Status: DC
  Administered 2019-09-22 – 2019-09-25 (×3): 12.5 mg via ORAL
  Filled 2019-09-22 (×3): qty 1

## 2019-09-22 MED ORDER — MIDAZOLAM HCL 5 MG/5ML IJ SOLN
INTRAMUSCULAR | Status: DC | PRN
  Administered 2019-09-22: 2 mg via INTRAVENOUS

## 2019-09-22 MED ORDER — PHENYLEPHRINE 40 MCG/ML (10ML) SYRINGE FOR IV PUSH (FOR BLOOD PRESSURE SUPPORT)
PREFILLED_SYRINGE | INTRAVENOUS | Status: DC | PRN
  Administered 2019-09-22 (×2): 200 ug via INTRAVENOUS

## 2019-09-22 MED ORDER — ONDANSETRON HCL 4 MG/2ML IJ SOLN
INTRAMUSCULAR | Status: DC | PRN
  Administered 2019-09-22: 4 mg via INTRAVENOUS

## 2019-09-22 MED ORDER — PROMETHAZINE HCL 25 MG/ML IJ SOLN: 6.2500 mg | INTRAMUSCULAR | Status: DC | PRN

## 2019-09-22 MED ORDER — METOCLOPRAMIDE HCL 5 MG/ML IJ SOLN: 5.0000 mg | Freq: Three times a day (TID) | INTRAMUSCULAR | Status: DC | PRN

## 2019-09-22 MED ORDER — SUGAMMADEX SODIUM 200 MG/2ML IV SOLN
INTRAVENOUS | Status: DC | PRN
  Administered 2019-09-22: 300 mg via INTRAVENOUS

## 2019-09-22 MED ORDER — MUPIROCIN 2 % EX OINT
1.0000 "application " | TOPICAL_OINTMENT | Freq: Two times a day (BID) | CUTANEOUS | Status: DC
  Administered 2019-09-22 – 2019-09-25 (×6): 1 via NASAL
  Filled 2019-09-22: qty 22

## 2019-09-22 MED ORDER — LIDOCAINE 2% (20 MG/ML) 5 ML SYRINGE
INTRAMUSCULAR | Status: DC | PRN
  Administered 2019-09-22: 80 mg via INTRAVENOUS

## 2019-09-22 MED ORDER — FENTANYL CITRATE (PF) 100 MCG/2ML IJ SOLN
INTRAMUSCULAR | Status: DC | PRN
  Administered 2019-09-22: 50 ug via INTRAVENOUS

## 2019-09-22 MED ORDER — FENTANYL CITRATE (PF) 250 MCG/5ML IJ SOLN
INTRAMUSCULAR | Status: AC
  Filled 2019-09-22: qty 5

## 2019-09-22 MED ORDER — ACETAMINOPHEN 500 MG PO TABS: 1000.0000 mg | ORAL_TABLET | Freq: Once | ORAL | Status: DC

## 2019-09-22 MED ORDER — PHENYLEPHRINE HCL-NACL 10-0.9 MG/250ML-% IV SOLN
INTRAVENOUS | Status: DC | PRN
  Administered 2019-09-22: 50 ug/min via INTRAVENOUS

## 2019-09-22 MED ORDER — PROPOFOL 10 MG/ML IV BOLUS
INTRAVENOUS | Status: AC
  Filled 2019-09-22: qty 20

## 2019-09-22 MED ORDER — CEFAZOLIN SODIUM-DEXTROSE 2-3 GM-%(50ML) IV SOLR
INTRAVENOUS | Status: DC | PRN
  Administered 2019-09-22: 2 g via INTRAVENOUS

## 2019-09-22 MED ORDER — POTASSIUM CHLORIDE IN NACL 20-0.9 MEQ/L-% IV SOLN
INTRAVENOUS | Status: DC
  Filled 2019-09-22: qty 1000

## 2019-09-22 MED ORDER — CEFAZOLIN SODIUM-DEXTROSE 1-4 GM/50ML-% IV SOLN
1.0000 g | Freq: Four times a day (QID) | INTRAVENOUS | Status: AC
  Administered 2019-09-22 – 2019-09-23 (×3): 1 g via INTRAVENOUS
  Filled 2019-09-22 (×3): qty 50

## 2019-09-22 MED ORDER — ONDANSETRON HCL 4 MG PO TABS: 4.0000 mg | ORAL_TABLET | Freq: Four times a day (QID) | ORAL | Status: DC | PRN

## 2019-09-22 MED ORDER — ACETAMINOPHEN 500 MG PO TABS
500.0000 mg | ORAL_TABLET | Freq: Three times a day (TID) | ORAL | Status: DC
  Administered 2019-09-22 – 2019-09-24 (×8): 500 mg via ORAL
  Filled 2019-09-22 (×9): qty 1

## 2019-09-22 MED ORDER — FENTANYL CITRATE (PF) 100 MCG/2ML IJ SOLN
INTRAMUSCULAR | Status: AC
  Filled 2019-09-22: qty 2

## 2019-09-22 MED ORDER — GLYCOPYRROLATE PF 0.2 MG/ML IJ SOSY
PREFILLED_SYRINGE | INTRAMUSCULAR | Status: DC | PRN
  Administered 2019-09-22: .2 mg via INTRAVENOUS

## 2019-09-22 MED ORDER — TELMISARTAN-HCTZ 80-12.5 MG PO TABS: 1.0000 | ORAL_TABLET | Freq: Every day | ORAL | Status: DC

## 2019-09-22 MED ORDER — CEFAZOLIN SODIUM-DEXTROSE 2-4 GM/100ML-% IV SOLN
INTRAVENOUS | Status: AC
  Filled 2019-09-22: qty 100

## 2019-09-22 MED ORDER — FENTANYL CITRATE (PF) 100 MCG/2ML IJ SOLN
25.0000 ug | INTRAMUSCULAR | Status: DC | PRN
  Administered 2019-09-22: 16:00:00 25 ug via INTRAVENOUS

## 2019-09-22 MED ORDER — DEXAMETHASONE SODIUM PHOSPHATE 4 MG/ML IJ SOLN
INTRAMUSCULAR | Status: DC | PRN
  Administered 2019-09-22: 4 mg via INTRAVENOUS

## 2019-09-22 SURGICAL SUPPLY — 72 items
BANDAGE ESMARK 6X9 LF (GAUZE/BANDAGES/DRESSINGS) ×1 IMPLANT
BIT DRILL 3.3 LONG (BIT) ×2 IMPLANT
BIT DRILL QC 3.3X195 (BIT) ×2 IMPLANT
BLADE CLIPPER SURG (BLADE) ×2 IMPLANT
BNDG CMPR 9X6 STRL LF SNTH (GAUZE/BANDAGES/DRESSINGS) ×1
BNDG COHESIVE 4X5 TAN STRL (GAUZE/BANDAGES/DRESSINGS) ×2 IMPLANT
BNDG ELASTIC 4X5.8 VLCR STR LF (GAUZE/BANDAGES/DRESSINGS) ×3 IMPLANT
BNDG ELASTIC 6X5.8 VLCR STR LF (GAUZE/BANDAGES/DRESSINGS) ×3 IMPLANT
BNDG ESMARK 6X9 LF (GAUZE/BANDAGES/DRESSINGS) ×3
BNDG GAUZE ELAST 4 BULKY (GAUZE/BANDAGES/DRESSINGS) ×3 IMPLANT
BRUSH SCRUB EZ PLAIN DRY (MISCELLANEOUS) ×4 IMPLANT
CAP LOCK NCB (Cap) ×8 IMPLANT
COVER MAYO STAND STRL (DRAPES) ×5 IMPLANT
DRAPE C-ARM 42X72 X-RAY (DRAPES) ×3 IMPLANT
DRAPE C-ARMOR (DRAPES) ×3 IMPLANT
DRAPE HALF SHEET 40X57 (DRAPES) ×6 IMPLANT
DRAPE INCISE IOBAN 66X45 STRL (DRAPES) ×3 IMPLANT
DRAPE U-SHAPE 47X51 STRL (DRAPES) ×3 IMPLANT
DRSG MEPITEL 4X7.2 (GAUZE/BANDAGES/DRESSINGS) ×2 IMPLANT
DRSG PAD ABDOMINAL 8X10 ST (GAUZE/BANDAGES/DRESSINGS) ×2 IMPLANT
ELECT REM PT RETURN 9FT ADLT (ELECTROSURGICAL) ×3
ELECTRODE REM PT RTRN 9FT ADLT (ELECTROSURGICAL) ×1 IMPLANT
GAUZE SPONGE 4X4 12PLY STRL (GAUZE/BANDAGES/DRESSINGS) ×3 IMPLANT
GLOVE BIO SURGEON STRL SZ7.5 (GLOVE) ×3 IMPLANT
GLOVE BIO SURGEON STRL SZ8 (GLOVE) ×3 IMPLANT
GLOVE BIOGEL PI IND STRL 7.5 (GLOVE) ×1 IMPLANT
GLOVE BIOGEL PI IND STRL 8 (GLOVE) ×1 IMPLANT
GLOVE BIOGEL PI INDICATOR 7.5 (GLOVE) ×2
GLOVE BIOGEL PI INDICATOR 8 (GLOVE) ×2
GLOVE XGUARD RR 2 7.5 (GLOVE) ×1 IMPLANT
GLOVE XGUARD RR2 7.5 (GLOVE) ×3
GOWN STRL REUS W/ TWL LRG LVL3 (GOWN DISPOSABLE) ×2 IMPLANT
GOWN STRL REUS W/ TWL XL LVL3 (GOWN DISPOSABLE) ×1 IMPLANT
GOWN STRL REUS W/TWL LRG LVL3 (GOWN DISPOSABLE) ×6
GOWN STRL REUS W/TWL XL LVL3 (GOWN DISPOSABLE) ×3
K-WIRE 2.0 (WIRE) ×3
K-WIRE FXSTD 280X2XNS SS (WIRE) ×1
KIT BASIN OR (CUSTOM PROCEDURE TRAY) ×3 IMPLANT
KIT TURNOVER KIT B (KITS) ×3 IMPLANT
KWIRE FXSTD 280X2XNS SS (WIRE) IMPLANT
NS IRRIG 1000ML POUR BTL (IV SOLUTION) ×3 IMPLANT
PACK ORTHO EXTREMITY (CUSTOM PROCEDURE TRAY) ×3 IMPLANT
PAD CAST 4YDX4 CTTN HI CHSV (CAST SUPPLIES) ×1 IMPLANT
PADDING CAST COTTON 4X4 STRL (CAST SUPPLIES) ×3
PADDING CAST COTTON 6X4 STRL (CAST SUPPLIES) ×3 IMPLANT
PLATE NCB LAT PROX 3H TIB 7H (Plate) ×2 IMPLANT
SCREW NCB 4.0 28MM (Screw) ×2 IMPLANT
SCREW NCB 4.0MX30M (Screw) ×4 IMPLANT
SCREW NCB 4.0MX34M (Screw) ×2 IMPLANT
SCREW NCB 4.0MX65M (Screw) ×2 IMPLANT
SCREW NCB 4.0X40MM (Screw) ×2 IMPLANT
SCREW NCB 4.0X75 CORT S/T (Screw) ×8 IMPLANT
SPONGE LAP 18X18 RF (DISPOSABLE) IMPLANT
STAPLER VISISTAT 35W (STAPLE) ×3 IMPLANT
STOCKINETTE IMPERVIOUS LG (DRAPES) IMPLANT
SUCTION FRAZIER HANDLE 10FR (MISCELLANEOUS) ×2
SUCTION TUBE FRAZIER 10FR DISP (MISCELLANEOUS) ×1 IMPLANT
SUT ETHILON 3 0 FSL (SUTURE) ×2 IMPLANT
SUT ETHILON 3 0 PS 1 (SUTURE) IMPLANT
SUT VIC AB 0 CT1 27 (SUTURE) ×3
SUT VIC AB 0 CT1 27XBRD ANBCTR (SUTURE) ×1 IMPLANT
SUT VIC AB 1 CT1 27 (SUTURE) ×3
SUT VIC AB 1 CT1 27XBRD ANBCTR (SUTURE) ×1 IMPLANT
SUT VIC AB 2-0 CT1 27 (SUTURE) ×6
SUT VIC AB 2-0 CT1 TAPERPNT 27 (SUTURE) ×2 IMPLANT
TOWEL GREEN STERILE (TOWEL DISPOSABLE) ×6 IMPLANT
TOWEL GREEN STERILE FF (TOWEL DISPOSABLE) ×3 IMPLANT
TRAY FOLEY MTR SLVR 16FR STAT (SET/KITS/TRAYS/PACK) ×2 IMPLANT
TUBE CONNECTING 12'X1/4 (SUCTIONS) ×1
TUBE CONNECTING 12X1/4 (SUCTIONS) ×2 IMPLANT
WATER STERILE IRR 1000ML POUR (IV SOLUTION) ×6 IMPLANT
YANKAUER SUCT BULB TIP NO VENT (SUCTIONS) ×3 IMPLANT

## 2019-09-22 NOTE — Anesthesia Preprocedure Evaluation (Addendum)
Anesthesia Evaluation  Patient identified by MRN, date of birth, ID band Patient awake    Reviewed: Allergy & Precautions, NPO status , Patient's Chart, lab work & pertinent test results  Airway Mallampati: II  TM Distance: >3 FB Neck ROM: Full    Dental  (+) Dental Advisory Given, Chipped   Pulmonary shortness of breath, former smoker,    Pulmonary exam normal breath sounds clear to auscultation       Cardiovascular hypertension, Pt. on medications (-) angina(-) Past MI Normal cardiovascular exam Rhythm:Regular Rate:Normal     Neuro/Psych PSYCHIATRIC DISORDERS Depression Muscular dystrophy   Neuromuscular disease    GI/Hepatic negative GI ROS, Neg liver ROS,   Endo/Other  negative endocrine ROS  Renal/GU negative Renal ROS     Musculoskeletal  (+) Arthritis , Polymyositis left proximal tibia fracture   Abdominal   Peds  Hematology negative hematology ROS (+)   Anesthesia Other Findings Day of surgery medications reviewed with the patient.  Reproductive/Obstetrics                            Anesthesia Physical Anesthesia Plan  ASA: III  Anesthesia Plan: General   Post-op Pain Management:    Induction: Intravenous  PONV Risk Score and Plan: 3 and Midazolam, Dexamethasone, Ondansetron and TIVA  Airway Management Planned: Oral ETT  Additional Equipment:   Intra-op Plan:   Post-operative Plan: Extubation in OR  Informed Consent: I have reviewed the patients History and Physical, chart, labs and discussed the procedure including the risks, benefits and alternatives for the proposed anesthesia with the patient or authorized representative who has indicated his/her understanding and acceptance.     Dental advisory given  Plan Discussed with: CRNA  Anesthesia Plan Comments: (NO Succinylcholine, NO Volatiles)       Anesthesia Quick Evaluation

## 2019-09-22 NOTE — ED Notes (Signed)
Pt transported to [facility], belongings taken to floor with pt.

## 2019-09-22 NOTE — Anesthesia Postprocedure Evaluation (Signed)
Anesthesia Post Note  Patient: Jessica Burke  Procedure(s) Performed: OPEN REDUCTION INTERNAL FIXATION (ORIF) PROXIMAL TIBIA FRACTURE (Left Leg Upper)     Patient location during evaluation: PACU Anesthesia Type: General Level of consciousness: awake and alert Pain management: pain level controlled Vital Signs Assessment: post-procedure vital signs reviewed and stable Respiratory status: spontaneous breathing, nonlabored ventilation, respiratory function stable and patient connected to nasal cannula oxygen Cardiovascular status: blood pressure returned to baseline and stable Postop Assessment: no apparent nausea or vomiting Anesthetic complications: no    Last Vitals:  Vitals:   09/22/19 1615 09/22/19 1626  BP: 137/88 (!) 151/88  Pulse: 97 93  Resp: 19 (!) 26  Temp:  (!) 36.4 C  SpO2: 92% 99%    Last Pain:  Vitals:   09/22/19 1615  TempSrc:   PainSc: [place] E Dewarren Ledbetter

## 2019-09-22 NOTE — Anesthesia Procedure Notes (Signed)

## 2019-09-22 NOTE — Progress Notes (Signed)
Orthopedic Tech Progress Note Patient Details:  Jessica Burke 05/10/1961 [id number] I applied an Over Head Frame with Trapeze for patient. Patient ID: Jessica Burke, female   DOB: 01/16/1961, 59 y.o.   MRN: [id number]   Janit Pagan 09/22/2019, 5:56 PM

## 2019-09-22 NOTE — Op Note (Signed)
09/22/2019 2:56 PM  PATIENT:  Jessica Burke  59 y.o. female [id number]  PRE-OPERATIVE DIAGNOSIS:   1. LEFT BICONDYLAR TIBIAL PLATEAU FRACTURE 2. LEFT KNEE HEMARTHROSIS  POST-OPERATIVE DIAGNOSIS:   1. LEFT BICONDYLAR TIBIAL PLATEAU FRACTURE 2. LEFT KNEE HEMARTHROSIS  PROCEDURE:  Procedure(s): 1. OPEN REDUCTION INTERNAL FIXATION (ORIF) BICONDYLAR TIBIAL PLATEAU (Left) 2. ASPIRATION OF LEFT KNEE HEMARTHROSIS  SURGEON:  Surgeon(s) and Role:    Altamese Ranlo, MD - Primary  PHYSICIAN ASSISTANT: Ainsley Spinner, PA-C  ANESTHESIA:   general  EBL:  50 mL   BLOOD ADMINISTERED:none  DRAINS: none   LOCAL MEDICATIONS USED:  NONE  SPECIMEN: None  DISPOSITION OF SPECIMEN:  N/A  COUNTS:  YES  TOURNIQUET:  * Missing tourniquet times found for documented tourniquets in log: [id number] *  DICTATION: Written below.  PLAN OF CARE: Admit to inpatient   PATIENT DISPOSITION:  PACU - hemodynamically stable.   Delay start of Pharmacological VTE agent (>24hrs) due to surgical blood loss or risk of bleeding: no     BRIEF SUMMARY AND INDICATION FOR PROCEDURE:  Patient is a 59 y.o.-year- old with a tibial plateau fracture and severe osteoporosis, treated provisionally with immediate immobilization, elevation, and active motion of the foot and toes to facilitate resolution of soft tissue swelling.  We did discuss with the patient the risks and benefits of surgical treatment including the potential for arthritis, nerve injury, vessel injury, loss of motion, DVT, PE, heart attack, stroke, symptomatic hardware, need for further surgery, and multiple others.  The patient acknowledged these risks and wished to proceed.   BRIEF SUMMARY OF PROCEDURE:  After administration of preoperative antibiotics, the patient was taken to the operating room.  General anesthesia was induced and the lower extremity prepped and draped in usual sterile fashion using a chlorhexidine wash and betadine scrub and paint. A  timeout was performed. I then brought in the bone foam and towel bump under the knee. We began with use of C-arm to identify correct plate placement for incision determination. A curvilinear incision was made extending laterally over Gerdy's tubercle. Dissection was carried down where the soft tissues were left intact to the lateral plateau and rim, elevated only the insertion of the extensors. I was then able to insert the 7 hole Zimmer NCB tibial plateau plate and pin it proximally at the joint line. I followed this with placement of the three subchondral screws which were notable for poor fixation despite being carefully placed at this level.  My assistant pulled traction and derotated the fracture and corrected the metaphyseal alignment. I then placed two proximal shaft bicortical screws to dial in alignment and then added two distal bicortical screws. Lastly, the jig was removed and an additional screw placed there. All proximal screws received locking caps. Final images showed appropriate reduction, implant position and length. All wounds were irrigated thoroughly.   Lastly, the large knee hemarthrosis was aspirated removing 110 cc of blood.  Ainsley Spinner, PA-C was present and assisting throughout with all portions of the procedure.   PROGNOSIS: The patient will not require formal bracing given the stable examination post fixation. Unrestricted range of motion will begin immediately.  She will be nonweightbearing on the operative extremity, be on pharmacologic DVT prophylaxis, and mobilized with PT and OT. After discharge, we will plan to see him back in about 2 weeks for removal of sutures. Discharge anticipated in two days.         Astrid Divine. Marcelino Scot, M.D.

## 2019-09-22 NOTE — ED Notes (Signed)
Called Ortho Tech @2314   ( In emergency at [facility])

## 2019-09-22 NOTE — Consult Note (Signed)
Orthopaedic Trauma Service Consultation  Reason for Consult: Left proximal tibia fracture Referring Physician: Adriana Mccallum, MD  Jessica Burke is an 59 y.o. female.  HPI: [place] yesterday, ambulates with difficulty secondary to >20 year h/o muscular dystrophy. Uses wheelchair around community, walker medium distance, cane around the house. Lives with husband and grandson, with lots of family and friends who help out regularly.   Past Medical History:  Diagnosis Date  . Anemia   . Arthritis   . Chest pain 12/2016  . Depression    2003  . Hypertension   . 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[place])   . Pneumonia    2016  . Polymyositis ([place])   . 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 Shortness of breath dyspnea     Past Surgical History:  Procedure Laterality Date  . CESAREAN SECTION    . ENDOMETRIAL ABLATION    . LEEP      Family History  Problem Relation Age of Onset  . Hypertension Father   . Heart disease Father   . Hypertension Mother   . Diabetes Maternal Aunt   . Breast cancer Maternal Aunt     Social History:  reports that she quit smoking about 30 years ago. She has never used smokeless tobacco. She reports that she does not drink alcohol or use drugs.  Allergies: No Known Allergies  Medications:  Prior to Admission:  Medications Prior to Admission  Medication Sig Dispense Refill Last Dose  . ACTHAR 80 UNIT/ML injectable gel Inject 80 Units into the skin See admin instructions. Tuesday and Thursday one week and Wednesday and Friday then next week   09/18/2019  . denosumab (PROLIA) 60 MG/ML SOSY injection Inject 60 mg into the skin every 6 (six) months.   unk at [employer]  . Multiple Vitamin (MULTIVITAMIN) tablet Take 1 tablet by mouth daily.   09/21/2019 at Unknown time  . riTUXimab (RITUXAN IV) Inject into the vein every 30 (thirty) days.   09/10/2019  . telmisartan-hydrochlorothiazide (MICARDIS HCT) 80-12.5 MG tablet Take 1 tablet by mouth daily.   09/21/2019 at Unknown time    Results  for orders placed or performed during the hospital encounter of 09/21/19 (from the past 48 hour(s))  CBC with Differential/Platelet     Status: Abnormal   Collection Time: 09/21/19  9:38 PM  Result Value Ref Range   WBC 17.9 (H) 4.0 - 10.5 K/uL   RBC 4.80 3.87 - 5.11 MIL/uL   Hemoglobin 13.0 12.0 - 15.0 g/dL   HCT 43.6 36.0 - 46.0 %   MCV 90.8 80.0 - 100.0 fL   MCH 27.1 26.0 - 34.0 pg   MCHC 29.8 (L) 30.0 - 36.0 g/dL   RDW 15.9 (H) 11.5 - 15.5 %   Platelets 242 150 - 400 K/uL   nRBC 0.0 0.0 - 0.2 %   Neutrophils Relative % 85 %   Neutro Abs 15.3 (H) 1.7 - 7.7 K/uL   Lymphocytes Relative 8 %   Lymphs Abs 1.5 0.7 - 4.0 K/uL   Monocytes Relative 6 %   Monocytes Absolute 1.0 0.1 - 1.0 K/uL   Eosinophils Relative 0 %   Eosinophils Absolute 0.1 0.0 - 0.5 K/uL   Basophils Relative 0 %   Basophils Absolute 0.0 0.0 - 0.1 K/uL   Immature Granulocytes 1 %   Abs Immature Granulocytes 0.09 (H) 0.00 - 0.07 K/uL    Comment: Performed at [facility], [place] [address] [place], [place] [id number]  Basic metabolic  panel     Status: Abnormal   Collection Time: 09/21/19  9:38 PM  Result Value Ref Range   Sodium 139 135 - 145 mmol/L   Potassium 3.5 3.5 - 5.1 mmol/L   Chloride 100 98 - 111 mmol/L   CO2 27 22 - 32 mmol/L   Glucose, Bld 139 (H) 70 - 99 mg/dL   BUN 13 6 - 20 mg/dL   Creatinine, Ser <0.30 (L) 0.44 - 1.00 mg/dL   Calcium 8.9 8.9 - 10.3 mg/dL   GFR calc non Af Amer NOT CALCULATED >60 mL/min   GFR calc Af Amer NOT CALCULATED >60 mL/min   Anion gap 12 5 - 15    Comment: Performed at [facility], [place] [address] [place], [place] [address]  Respiratory Panel by RT PCR (Flu A&B, Covid) - Nasopharyngeal Swab     Status: None   Collection Time: 09/21/19 10:32 PM   Specimen: Nasopharyngeal Swab  Result Value Ref Range   SARS Coronavirus 2 by RT PCR NEGATIVE NEGATIVE    Comment: (NOTE) SARS-CoV-2 target nucleic acids are NOT DETECTED. The SARS-CoV-2  RNA is generally detectable in upper respiratoy specimens during the acute phase of infection. The lowest concentration of SARS-CoV-2 viral copies this assay can detect is 131 copies/mL. A negative result does not preclude SARS-Cov-2 infection and should not be used as the sole basis for treatment or other patient management decisions. A negative result may occur with  improper specimen collection/handling, submission of specimen other than nasopharyngeal swab, presence of viral mutation(s) within the areas targeted by this assay, and inadequate number of viral copies (<131 copies/mL). A negative result must be combined with clinical observations, patient history, and epidemiological information. The expected result is Negative. Fact Sheet for Patients:  PinkCheek.be Fact Sheet for Healthcare Providers:  GravelBags.it This test is not yet ap proved or cleared by the [place] FDA and  has been authorized for detection and/or diagnosis of SARS-CoV-2 by FDA under an Emergency Use Authorization (EUA). This EUA will remain  in effect (meaning this test can be used) for the duration of the COVID-19 declaration under Section 564(b)(1) of the Act, 21 U.S.C. section 360bbb-3(b)(1), unless the authorization is terminated or revoked sooner.    Influenza A by PCR NEGATIVE NEGATIVE   Influenza B by PCR NEGATIVE NEGATIVE    Comment: (NOTE) The Xpert Xpress SARS-CoV-2/FLU/RSV assay is intended as an aid in  the diagnosis of influenza from Nasopharyngeal swab specimens and  should not be used as a sole basis for treatment. Nasal washings and  aspirates are unacceptable for Xpert Xpress SARS-CoV-2/FLU/RSV  testing. Fact Sheet for Patients: PinkCheek.be Fact Sheet for Healthcare Providers: GravelBags.it This test is not yet approved or cleared by the [place] FDA and  has  been authorized for detection and/or diagnosis of SARS-CoV-2 by  FDA under an Emergency Use Authorization (EUA). This EUA will remain  in effect (meaning this test can be used) for the duration of the  Covid-19 declaration under Section 564(b)(1) of the Act, 21  U.S.C. section 360bbb-3(b)(1), unless the authorization is  terminated or revoked. Performed at [facility], [place] [address], [place], [place] [address]   HIV Antibody (routine testing w rflx)     Status: None   Collection Time: 09/22/19  3:25 AM  Result Value Ref Range   HIV Screen 4th Generation wRfx NON REACTIVE NON REACTIVE    Comment: Performed at [place]  [address] [place], [place] [address]    DG Tibia/Fibula Left  Result Date: 09/21/2019 CLINICAL DATA:  Initial evaluation for acute trauma, fall. EXAM: LEFT TIBIA AND FIBULA - 2 VIEW COMPARISON:  None. FINDINGS: Acute oblique fracture extends through the proximal left tibial shaft with 5 mm lateral displacement. Remotely healed fracture of the left fibular neck. Question faint linear lucency extending through this region, raising the possibility for a an acute on chronic component. No other acute fracture or dislocation about the tibia or fibula. Underlying osteopenia. Soft tissue swelling seen about the proximal leg. IMPRESSION: 1. Acute oblique fracture through the proximal left tibial shaft with mild lateral displacement. 2. Remotely healed fracture of the left fibular neck. Question faint linear lucency extending through this region, raising the possibility for an acute on chronic component. Electronically Signed   By: Jeannine Boga M.D.   On: 09/21/2019 20:53   DG Ankle Complete Right  Result Date: 09/21/2019 CLINICAL DATA:  Initial evaluation for acute trauma, fall. EXAM: RIGHT ANKLE - COMPLETE 3+ VIEW COMPARISON:  None. FINDINGS: No acute fracture or dislocation. Ankle mortise approximated. Talar dome intact. Posterior and plantar  calcaneal enthesophytes noted. Underlying osteopenia. Soft tissue swelling seen about the ankle, most notable at the lateral malleolus. IMPRESSION: 1. No acute fracture or dislocation. 2. Soft tissue swelling about the ankle, most notable at the lateral malleolus. Electronically Signed   By: Jeannine Boga M.D.   On: 09/21/2019 20:51   DG Knee Complete 4 Views Left  Result Date: 09/21/2019 CLINICAL DATA:  Initial evaluation for acute trauma, fall. EXAM: LEFT KNEE - COMPLETE 4+ VIEW COMPARISON:  None. FINDINGS: There is an acute oblique fracture extending through the proximal left tibial shaft with mild 5 mm of lateral displacement. No intra-articular extension. Remotely healed fracture noted at the adjacent fibular neck. Thin linear lucency extending through this region raises the possibility for a superimposed acute component. Distal femur and patella are intact. No joint effusion. Underlying osteopenia. Soft tissue swelling seen surrounding the proximal leg. IMPRESSION: 1. Acute oblique fracture through the proximal left tibial shaft with mild lateral displacement. 2. Remotely healed fracture of the adjacent left fibular neck. Thin linear lucency extending through this region raises the possibility for a superimposed acute on chronic component. 3. Underlying osteopenia. Electronically Signed   By: Jeannine Boga M.D.   On: 09/21/2019 20:49   DG Knee Complete 4 Views Right  Result Date: 09/21/2019 CLINICAL DATA:  Fall, knee pain EXAM: RIGHT KNEE - COMPLETE 4+ VIEW COMPARISON:  None. FINDINGS: Old healed proximal fibular shaft fracture. Small joint effusion. No acute bony abnormality. Specifically, no acute fracture, subluxation, or dislocation. IMPRESSION: No acute fracture.  Small joint effusion. Electronically Signed   By: Rolm Baptise M.D.   On: 09/21/2019 20:46    ROS Muscular dystrophy since 1999,  Steroids for years taper from 60mg / d, MTX, Embrel, and others. Denies renal problems,  easy bleeding. Blood pressure (!) 133/91, pulse 91, temperature 98 F (36.7 C), temperature source Oral, resp. rate 19, height 5' 2.99" (1.6 m), weight 73.9 kg, last menstrual period 11/26/2012, SpO2 100 %. Physical Exam A&O x 4 RRR No wheezing or chest retractions Abdomen soft, ND LLE Dressing intact, clean, dry  Edema/ swelling controlled  Sens: DPN, SPN, TN intact  Motor: EHL, FHL, and lessor toe ext and flex all intact grossly  Brisk cap refill, warm to touch  Assessment/Plan: Severe osteoporosis, gait abnormality, left proximal tibia fracture  I discussed with the  patient the risks and benefits of surgery, including the possibility of infection, nerve injury, vessel injury, wound breakdown, arthritis, symptomatic hardware, DVT/ PE, loss of motion, malunion, nonunion, and need for further surgery among others.  We also specifically discussed the possibility of nonsurgical treatment and nailing versus plating. She acknowledged these risks and wished to proceed.  Altamese Chamberlayne, MD [facility], [facility] [phone]  09/22/2019  12:37 PM

## 2019-09-22 NOTE — Progress Notes (Signed)
Orthopedic Tech Progress Note Patient Details:  Jessica Burke June 03, 1961 [id number]  Ortho Devices Type of Ortho Device: Ace wrap, Knee Immobilizer, Cotton web roll, Post (long leg) splint Ortho Device/Splint Location: LLE Ortho Device/Splint Interventions: Ordered, Application   Post Interventions Patient Tolerated: Fair Instructions Provided: Care of device   Corita Allinson N Kaylor Maiers 09/22/2019, 3:29 AM

## 2019-09-22 NOTE — Transfer of Care (Signed)
Immediate Anesthesia Transfer of Care Note  Patient: Jessica Burke  Procedure(s) Performed: OPEN REDUCTION INTERNAL FIXATION (ORIF) PROXIMAL TIBIA FRACTURE (Left Leg Upper)  Patient Location: PACU  Anesthesia Type:General  Level of Consciousness: awake, oriented and patient cooperative  Airway & Oxygen Therapy: Patient Spontanous Breathing and Patient connected to face mask oxygen  Post-op Assessment: Report given to RN and Post -op Vital signs reviewed and stable  Post vital signs: Reviewed  Last Vitals:  Vitals Value Taken Time  BP 134/108 09/22/19 1543  Temp    Pulse    Resp 26 09/22/19 1547  SpO2    Vitals shown include unvalidated device data.  Last Pain:  Vitals:   09/22/19 0953  TempSrc:   PainSc: 7       Patients Stated Pain Goal: 2 ([id number])  Complications: No apparent anesthesia complications

## 2019-09-23 ENCOUNTER — Encounter (HOSPITAL_COMMUNITY): Payer: Self-pay | Admitting: Orthopedic Surgery

## 2019-09-23 DIAGNOSIS — Z92241 Personal history of systemic steroid therapy: Secondary | ICD-10-CM

## 2019-09-23 DIAGNOSIS — M810 Age-related osteoporosis without current pathological fracture: Secondary | ICD-10-CM | POA: Diagnosis present

## 2019-09-23 DIAGNOSIS — G7100 Muscular dystrophy, unspecified: Secondary | ICD-10-CM | POA: Diagnosis present

## 2019-09-23 HISTORY — DX: Personal history of systemic steroid therapy: Z92.241

## 2019-09-23 LAB — URINALYSIS, ROUTINE W REFLEX MICROSCOPIC
Bilirubin Urine: NEGATIVE
Glucose, UA: NEGATIVE mg/dL
Ketones, ur: 20 mg/dL — AB
Leukocytes,Ua: NEGATIVE
Nitrite: NEGATIVE
Protein, ur: NEGATIVE mg/dL
Specific Gravity, Urine: 1.003 — ABNORMAL LOW (ref 1.005–1.030)
pH: 6 (ref 5.0–8.0)

## 2019-09-23 LAB — CBC
HCT: 34.8 % — ABNORMAL LOW (ref 36.0–46.0)
Hemoglobin: 11.1 g/dL — ABNORMAL LOW (ref 12.0–15.0)
MCH: 27.7 pg (ref 26.0–34.0)
MCHC: 31.9 g/dL (ref 30.0–36.0)
MCV: 86.8 fL (ref 80.0–100.0)
Platelets: 196 10*3/uL (ref 150–400)
RBC: 4.01 MIL/uL (ref 3.87–5.11)
RDW: 14.7 % (ref 11.5–15.5)
WBC: 22 10*3/uL — ABNORMAL HIGH (ref 4.0–10.5)
nRBC: 0 % (ref 0.0–0.2)

## 2019-09-23 LAB — COMPREHENSIVE METABOLIC PANEL
ALT: 24 U/L (ref 0–44)
AST: 26 U/L (ref 15–41)
Albumin: 3.4 g/dL — ABNORMAL LOW (ref 3.5–5.0)
Alkaline Phosphatase: 63 U/L (ref 38–126)
Anion gap: 12 (ref 5–15)
BUN: <5 mg/dL — ABNORMAL LOW (ref 6–20)
CO2: 29 mmol/L (ref 22–32)
Calcium: 8.2 mg/dL — ABNORMAL LOW (ref 8.9–10.3)
Chloride: 98 mmol/L (ref 98–111)
Creatinine, Ser: 0.33 mg/dL — ABNORMAL LOW (ref 0.44–1.00)
GFR calc Af Amer: >60 mL/min (ref >60)
GFR calc non Af Amer: >60 mL/min (ref >60)
Glucose, Bld: 81 mg/dL (ref 70–99)
Potassium: 3.1 mmol/L — ABNORMAL LOW (ref 3.5–5.1)
Sodium: 139 mmol/L (ref 135–145)
Total Bilirubin: 1.1 mg/dL (ref 0.3–1.2)
Total Protein: 6.5 g/dL (ref 6.5–8.1)

## 2019-09-23 LAB — VITAMIN D 25 HYDROXY (VIT D DEFICIENCY, FRACTURES): Vit D, 25-Hydroxy: 18.58 ng/mL — ABNORMAL LOW (ref 30–100)

## 2019-09-23 MED ORDER — VITAMIN D 25 MCG (1000 UNIT) PO TABS
2000.0000 [IU] | ORAL_TABLET | Freq: Two times a day (BID) | ORAL | Status: DC
  Administered 2019-09-23 – 2019-09-25 (×5): 2000 [IU] via ORAL
  Filled 2019-09-23 (×5): qty 2

## 2019-09-23 MED ORDER — ENOXAPARIN (LOVENOX) PATIENT EDUCATION KIT
PACK | Freq: Once | Status: AC
  Filled 2019-09-23: qty 1

## 2019-09-23 MED ORDER — ASCORBIC ACID 500 MG PO TABS
500.0000 mg | ORAL_TABLET | Freq: Every day | ORAL | Status: DC
  Administered 2019-09-23 – 2019-09-25 (×3): 500 mg via ORAL
  Filled 2019-09-23 (×3): qty 1

## 2019-09-23 MED ORDER — POTASSIUM CHLORIDE CRYS ER 20 MEQ PO TBCR
40.0000 meq | EXTENDED_RELEASE_TABLET | Freq: Once | ORAL | Status: AC
  Administered 2019-09-23: 16:00:00 40 meq via ORAL
  Filled 2019-09-23: qty 2

## 2019-09-23 NOTE — Evaluation (Signed)
Physical Therapy Evaluation Patient Details Name: Jessica Burke MRN: [id number] DOB: 29-Apr-1961 Today's Date: 09/23/2019   History of Present Illness  Pt is a 59 yo female s/p fall on L knee resulting in L tibia/fibula fx requiring ORIF of bicondylar tibial plateau and aspiration of L knee hemiarthrosis, NWB, no ROM restrictions. PMHx: muscular dystrophy, falls, depression, HTN, PNA.  Clinical Impression   Patient is s/p above surgery resulting in functional limitations due to the deficits listed below (see PT Problem List). Household ambulator with cane; Well-equipped at home; Presents to PT with LLE pain, weakness, decr trunk stabiltiy and sitting balance, difficulty maintaining NWB LLE; Has lots of help at home, well-equipped, and ope to getting a hoyer lift;  Patient will benefit from skilled PT to increase their independence and safety with mobility to allow discharge to the venue listed below.    We discussed prioritizing getting home at this point, and that given she is NWB LLE, she will need to accept more help; She has had a hoyer lift at home before, but sent it back because she didn't use it -- is more open to getting one now, citing her husband "isn't getting any younger"     Follow Up Recommendations Home health PT;Supervision/Assistance - 24 hour;Other (comment)(Consider HHOT/HHAide as well)    Equipment Recommendations  Other (comment)(rec hoyer lift, shower seat (will defer to OT), sliding board, otherwise well-equipped)    Recommendations for Other Services       Precautions / Restrictions Precautions Precautions: Fall Restrictions Weight Bearing Restrictions: Yes LLE Weight Bearing: Non weight bearing      Mobility  Bed Mobility Overal bed mobility: Needs Assistance Bed Mobility: Rolling;Sidelying to Sit Rolling: Mod assist Sidelying to sit: Max assist;+2 for physical assistance;+2 for safety/equipment       General bed mobility comments: Good use of  rails to roll L and R, mod assist to complete roll into fully sidelying; Max assist to help LEs off of the bed and elevate trunk to sit; Very unstable at initial sit, needing Max assist to stabilize  Transfers Overall transfer level: Needs assistance Equipment used: 2 person hand held assist(and bil support at gait belt) Transfers: Squat Pivot Transfers     Squat pivot transfers: +2 physical assistance;Max assist     General transfer comment: +2 Max assist for squat-pivot transfer OOB to recliner on her R side, with 2 bouts of effort; Noting pt did put some power up into her RLE to lift bottom and hips; max assist for stability, and to help keep NWB LLE  Ambulation/Gait                Stairs            Wheelchair Mobility    Modified Rankin (Stroke Patients Only)       Balance Overall balance assessment: Needs assistance Sitting-balance support: Bilateral upper extremity supported;Feet supported Sitting balance-Leahy Scale: Poor Sitting balance - Comments: Needing heavy mod to minguard assist for sitting balance, and close guard of R knee to prevent slipping at EOB                                     Pertinent Vitals/Pain Pain Assessment: 0-10 Pain Score: 3  Pain Location: L knee/leg Pain Descriptors / Indicators: Aching Pain Intervention(s): Monitored during session;Repositioned    Home Living Family/patient expects to be discharged to:: Private residence Living Arrangements:  Spouse/significant other;Other relatives Available Help at Discharge: Family;Available 24 hours/day Type of Home: House Home Access: Level entry     Home Layout: Multi-level;Able to live on main level with bedroom/bathroom Home Equipment: Hand held shower head;Bedside commode;Walker - 2 wheels;Cane - single point;Walker - 4 wheels;Wheelchair - manual      Prior Function Level of Independence: Needs assistance   Gait / Transfers Assistance Needed: Tells [place] she  prefers using her cane for short distance household amb; Also tells [place] her grandson and husband tend to give her LOTS of assist ("alley-oop") to transfer  ADL's / Homemaking Assistance Needed: stands in the shower  Comments: SPC for mobility; does not navigate stairs at home     Hand Dominance   Dominant Hand: Right    Extremity/Trunk Assessment   Upper Extremity Assessment Upper Extremity Assessment: Defer to OT evaluation    Lower Extremity Assessment Lower Extremity Assessment: RLE deficits/detail;LLE deficits/detail RLE Deficits / Details: Weakness noted throughout, grossly 3-/5 hip, knee; ankle can passivly dorsiflex to neutral, but not past neutral; noted decr hip stability in hooklying LLE Deficits / Details: Knee and lower leg dressed and Ace wrapped; Pain limiting knee flexion during heel slides; Good quad contraction with quad setting    Cervical / Trunk Assessment Cervical / Trunk Assessment: Other exceptions Cervical / Trunk Exceptions: Noted trunk weakness and tendency ot sit with upper and lower trunk flexed  Communication   Communication: No difficulties  Cognition Arousal/Alertness: Awake/alert Behavior During Therapy: WFL for tasks assessed/performed Overall Cognitive Status: Within Functional Limits for tasks assessed                                        General Comments General comments (skin integrity, edema, etc.): We discussed prioritizing getting home at this point, and that given she is NWB LLE, she will need to accept more help; She has had a hoyer lift at home before, but sent it back because she didn't use it -- is more open to gettin gone now, citing her husband "isn't getting any younger"    Exercises     Assessment/Plan    PT Assessment Patient needs continued PT services  PT Problem List Decreased strength;Decreased range of motion;Decreased activity tolerance;Decreased balance;Decreased mobility;Decreased  coordination;Decreased knowledge of use of DME;Decreased safety awareness;Decreased knowledge of precautions;Pain;Impaired tone       PT Treatment Interventions DME instruction;Functional mobility training;Therapeutic activities;Therapeutic exercise;Balance training;Neuromuscular re-education;Cognitive remediation;Patient/family education;Wheelchair mobility training    PT Goals (Current goals can be found in the Care Plan section)  Acute Rehab PT Goals Patient Stated Goal: get home soon PT Goal Formulation: With patient Time For Goal Achievement: 10/07/19 Potential to Achieve Goals: Good    Frequency Min 3X/week   Barriers to discharge        Co-evaluation               AM-PAC PT "6 Clicks" Mobility  Outcome Measure Help needed turning from your back to your side while in a flat bed without using bedrails?: A Lot Help needed moving from lying on your back to sitting on the side of a flat bed without using bedrails?: A Lot Help needed moving to and from a bed to a chair (including a wheelchair)?: A Lot Help needed standing up from a chair using your arms (e.g., wheelchair or bedside chair)?: A Lot Help needed to walk in hospital room?:  Total Help needed climbing 3-5 steps with a railing? : Total 6 Click Score: 10    End of Session Equipment Utilized During Treatment: Gait belt Activity Tolerance: Patient tolerated treatment well Patient left: in chair;with call bell/phone within reach;with chair alarm set Nurse Communication: Mobility status;Need for lift equipment PT Visit Diagnosis: Unsteadiness on feet (R26.81);Other abnormalities of gait and mobility (R26.89);History of falling (Z91.81);Pain Pain - Right/Left: Left Pain - part of body: Leg    Time: [id number] PT Time Calculation (min) (ACUTE ONLY): 52 min   Charges:   PT Evaluation $PT Eval Moderate Complexity: 1 Mod PT Treatments $Therapeutic Activity: 8-22 mins        Roney Marion, PT  Acute  Rehabilitation Services Pager [phone] Office [phone]   [place] 09/23/2019, 11:33 AM

## 2019-09-23 NOTE — Progress Notes (Signed)
Orthopaedic Trauma Service Progress Note  Patient ID: Jessica Burke MRN: [id number] DOB/AGE: [birth year] 59 y.o.  Subjective:  Doing ok  Pain moderate No other complaints  Patient states that her leg snapped while she was mobilizing in her bedroom and then she fell to the ground.  Working with therapy  Foley has been removed and she has already voided  Has the equipment she will likely need   Uses a cane around the house. Has rollator as well as wheelchair Uses wheelchair when she is out for appointments   Pt has been various meds for her bone health.  Most recent regimen: fosamax x 5 years then tymlos x 2 years and no prolia (has only had 2 injections) Has a very good understanding of the things that are compromising her bone health.    Review of Systems  Constitutional: Negative for chills and fever.  Respiratory: Negative for wheezing.   Cardiovascular: Negative for chest pain.  Gastrointestinal: Negative for abdominal pain, nausea and vomiting.  Neurological: Negative for tingling and sensory change.    Objective:   VITALS:   Vitals:   09/22/19 1922 09/23/19 0339 09/23/19 0756 09/23/19 0807  BP: (!) 154/90 123/64 (!) 138/19 120/73  Pulse: 86 (!) 101 (!) 106 (!) 107  Resp: 16 17 17 16   Temp: 98.6 F (37 C) 98.6 F (37 C) 98.1 F (36.7 C)   TempSrc:  Oral Oral   SpO2: 100% 97% 100% 97%  Weight:      Height:        Estimated body mass index is 28.88 kg/m as calculated from the following:   Height as of this encounter: 5' 2.99" (1.6 m).   Weight as of this encounter: 73.9 kg.   Intake/Output      01/26 0701 - 01/27 0700 01/27 0701 - 01/28 0700   P.O. 0 120   I.V. (mL/kg) 2245 (30.4)    IV Piggyback 0    Total Intake(mL/kg) 2245 (30.4) 120 (1.6)   Urine (mL/kg/hr) 3600 (2)    Blood 50    Total Output 3650    Net -1405 +120          LABS  Results for orders placed or  performed during the hospital encounter of 09/21/19 (from the past 24 hour(s))  CBC     Status: Abnormal   Collection Time: 09/22/19  6:28 PM  Result Value Ref Range   WBC 22.6 (H) 4.0 - 10.5 K/uL   RBC 4.09 3.87 - 5.11 MIL/uL   Hemoglobin 11.1 (L) 12.0 - 15.0 g/dL   HCT 36.6 36.0 - 46.0 %   MCV 89.5 80.0 - 100.0 fL   MCH 27.1 26.0 - 34.0 pg   MCHC 30.3 30.0 - 36.0 g/dL   RDW 15.1 11.5 - 15.5 %   Platelets 214 150 - 400 K/uL   nRBC 0.0 0.0 - 0.2 %  Creatinine, serum     Status: Abnormal   Collection Time: 09/22/19  6:28 PM  Result Value Ref Range   Creatinine, Ser <0.30 (L) 0.44 - 1.00 mg/dL   GFR calc non Af Amer NOT CALCULATED >60 mL/min   GFR calc Af Amer NOT CALCULATED >60 mL/min  CBC     Status: Abnormal   Collection Time: 09/23/19  1:58 AM  Result Value  Ref Range   WBC 22.0 (H) 4.0 - 10.5 K/uL   RBC 4.01 3.87 - 5.11 MIL/uL   Hemoglobin 11.1 (L) 12.0 - 15.0 g/dL   HCT 34.8 (L) 36.0 - 46.0 %   MCV 86.8 80.0 - 100.0 fL   MCH 27.7 26.0 - 34.0 pg   MCHC 31.9 30.0 - 36.0 g/dL   RDW 14.7 11.5 - 15.5 %   Platelets 196 150 - 400 K/uL   nRBC 0.0 0.0 - 0.2 %     PHYSICAL EXAM:   Gen: resting comfortably in bed, NAD, very pleasant  Lungs: clear anterior fields Cardiac: tachy but regular Abd: + BS, NTND Ext:       Left Lower Extremity   Dressing c/d/i  Ext warm  Swelling controlled  Compartments are soft, no pain with passive stretch   + DP pulse  EHL, FHL, lesser toe motor intact  Ankle flexion, extension, inversion and eversion grossly intact  No DCT     Assessment/Plan: 1 Day Post-Op   Principal Problem:   Closed fracture of left proximal tibia Active Problems:   HTN (hypertension)   Polymyositis (HCC)   Osteoporosis   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[place])   H/O corticosteroid therapy   Anti-infectives (From admission, onward)   Start     Dose/Rate Route Frequency Ordered Stop   09/22/19 1900  ceFAZolin (ANCEF) IVPB 1 g/50 mL premix     1 g 100 mL/hr over 30  Minutes Intravenous Every 6 hours 09/22/19 1644 09/23/19 0849   09/22/19 1240  ceFAZolin (ANCEF) 2-4 GM/100ML-% IVPB    Note to Pharmacy: Grace Blight   : cabinet override      09/22/19 1240 09/23/19 0044    .  POD/HD#: 79  59 year old black female with complex medical history including osteoporosis, muscular dystrophy polymyositis s/p insufficiency fracture of left proximal tibia  -Insufficiency fracture left proximal tibia s/p ORIF  Nonweightbearing 6 to 8 weeks  Unrestricted range of motion left knee and ankle  PT and OT evaluations  Dressing change tomorrow  Will order hinged knee brace  Aggressive ice and elevation for swelling and pain control    PT- please teach HEP for L knee ROM- AROM, PROM. Prone exercises as well. No ROM restrictions.  Quad sets, SLR, LAQ, SAQ, heel slides, stretching, prone flexion and extension    Ankle theraband program, heel cord stretching, toe towel curls, etc    No pillows under bend of knee when at rest, ok to place under heel to help work on extension. Can also use zero knee bone foam if available  - Pain management:  Continue with scheduled Tylenol and Norco as needed  - ABL anemia/Hemodynamics  Continue to monitor  - Medical issues   Continue home meds  - DVT/PE prophylaxis:  Lovenox x 21 days  - ID:   Perioperative antibiotics completed today  - Metabolic Bone Disease:  Osteoporosis   Does seem like her primary care physician is doing very good job in alternating her treatment regimen unfortunately patient sustained a fracture.  Patient has extended her PTH analog use by being on Tymlos for 2 years so this eliminates this class of medication from treatment protocol.  Do think that Prolia is a great option for her.  However we could consider using Evenity on her if she requires an additional anabolic agent.  I also do think that this insufficiency fracture is related to some lingering effects from bisphosphonates given its 10-year  half-life.  Vitamin  D levels are pending  Continue to maximize nutrition and weightbearing activities as weightbearing activities will have much greater impact on her overall bone strength  - Activity:  Up with therapies  - FEN/GI prophylaxis/Foley/Lines:  Regular diet  Foley discontinued   Do not use pure wick  Continue with IV and IV fluids  - Impediments to fracture healing:  Autoimmune disease  History of chronic steroid use  Known osteoporosis  Limited mobility/weightbearing activity  - Dispo:  Therapy eval's today  Likely home tomorrow, may need ambulance transportation home   Jari Pigg, PA-C [phone] (C) 09/23/2019, 9:27 AM  [facility] [place] [phone] Domingo Sep (F)   After 6pm on weekdays please call office number to get in touch with on call provider or refer to [place] and look to see who is on call for the Sports Medicine Call Group which is listed under orthopaedics   On Weekends please call office number to get in touch with on call provider or refer to [place] and look to see who is on call for the Sports Medicine Call Group which is listed under orthopaedics

## 2019-09-23 NOTE — TOC Initial Note (Signed)
Transition of Care [facility]) - Initial/Assessment Note    Patient Details  Name: Jessica Burke MRN: [id number] Date of Birth: 10-24-1960  Transition of [facility]) CM/SW Contact:    Bartholomew Crews, RN Phone Number: [phone] 09/23/2019, 5:18 PM  Clinical Narrative:                 Spoke with patient and spouse at bedside to discuss plans to transition home. PTA home with spouse. States that she already requires much assistance with getting up prior to this admission. Discussed HH services and choice of agency - Referral accepted by [facility] for PT, OT, Aide, SW.   Patient will need HH orders for PT, OT, Aide SW with Face to Face at discharge.   Patient already has some DME in the home to include walker and wheelchair. Patient would benefit from hoyer lift, slide board, and 3-N-1 Will need DME orders.   Patient would like to consider SNF for STR, but if unable to go to SNF will DC with [facility] and DME.   TOC following.   Expected Discharge Plan: [place] Barriers to Discharge: Continued Medical Work up   Patient Goals and CMS Choice Patient states their goals for this hospitalization and ongoing recovery are:: return home with spouse and be able to get around better CMS Medicare.gov Compare Post Acute Care list provided to:: Patient Choice offered to / list presented to : Patient, Spouse  Expected Discharge Plan and Services Expected Discharge Plan: [place]   Discharge Planning Services: CM Consult Post Acute Care Choice: Durable Medical Equipment, Home Health Living arrangements for the past 2 months: Single Family Home                 DME Arranged: 3-N-1, Other see comment(hoyer lift and slide board) DME Agency: [employer]       HH Arranged: PT, OT, Nurse's Aide, Social Work [place] Agency: [facility] Date [place]: 09/23/19 Time [place]: 1716 Representative spoke with at [place]: [place]  Prior Living  Arrangements/Services Living arrangements for the past 2 months: [place] with:: Self, Spouse Patient language and need for interpreter reviewed:: Yes Do you feel safe going back to the place where you live?: Yes      Need for Family Participation in Patient Care: Yes (Comment) Care giver support system in place?: Yes (comment) Current home services: DME Criminal Activity/Legal Involvement Pertinent to Current Situation/Hospitalization: No - Comment as needed  Activities of Daily Living Home Assistive Devices/Equipment: Cane (specify quad or straight) ADL Screening (condition at time of admission) Patient's cognitive ability adequate to safely complete daily activities?: Yes Is the patient deaf or have difficulty hearing?: No Does the patient have difficulty seeing, even when wearing glasses/contacts?: No Does the patient have difficulty concentrating, remembering, or making decisions?: No Patient able to express need for assistance with ADLs?: Yes Does the patient have difficulty dressing or bathing?: Yes Independently performs ADLs?: No Communication: Independent Dressing (OT): Needs assistance, Independent with device (comment) Is this a change from baseline?: Pre-admission baseline Grooming: Needs assistance Is this a change from baseline?: Pre-admission baseline Feeding: Independent Bathing: Independent Toileting: Independent In/Out Bed: Needs assistance Is this a change from baseline?: Pre-admission baseline Walks in Home: Needs assistance Is this a change from baseline?: Pre-admission baseline Does the patient have difficulty walking or climbing stairs?: Yes Weakness of Legs: Both Weakness of Arms/Hands: Both  Permission Sought/Granted Permission  sought to share information with : Family Supports Permission granted to share information with : Yes, Verbal Permission Granted  Share Information with NAME: Juley Hopson     Permission granted to share info  w Relationship: spouse  Permission granted to share info w Contact Information: [phone]  Emotional Assessment Appearance:: Appears stated age Attitude/Demeanor/Rapport: Engaged Affect (typically observed): Accepting Orientation: : Oriented to Self, Oriented to Place, Oriented to  Time, Oriented to Situation Alcohol / Substance Use: Not Applicable Psych Involvement: No (comment)  Admission diagnosis:  Closed fracture of left proximal tibia [S82.102A] Closed fracture of proximal end of left tibia, unspecified fracture morphology, initial encounter [S82.102A] Patient Active Problem List   Diagnosis Date Noted  . H/O corticosteroid therapy 09/23/2019  . Osteoporosis   . 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[place])   . Closed fracture of left proximal tibia 09/21/2019  . CAP (community acquired pneumonia)   . Polymyositis ([place]) 08/03/2015  . Acute respiratory failure ([place]) 11/21/2014  . Acute hypoxemic respiratory failure ([place]) 11/20/2014  . Influenza A (H1N1) 11/20/2014  . Autoimmune disease ([place]) 11/20/2014  . Chest pain 03/17/2013  . Leukocytosis 03/17/2013  . Anemia 03/17/2013  . Hypokalemia 03/17/2013  . HTN (hypertension) 03/17/2013   PCP:  Jani Gravel, MD Pharmacy:   [id number] - [place], [place] [id number] [place] [id number] Phone: [phone] Fax: [phone]     Social Determinants of Health (SDOH) Interventions    Readmission Risk Interventions No flowsheet data found.

## 2019-09-23 NOTE — Evaluation (Signed)
Occupational Therapy Evaluation Patient Details Name: Jessica Burke MRN: [id number] DOB: Jan 07, 1961 Today's Date: 09/23/2019    History of Present Illness Pt is a 59 yo female s/p fall on L knee resulting in L tibia/fibula fx requiring ORIF of bicondylar tibial plateau and aspiration of L knee hemiarthrosis, NWB, no ROM restrictions. PMHx: muscular dystrophy, falls, depression, HTN, PNA.   Clinical Impression   Pt PTA: living with family, requires assist with ADL and mobility. Pt's grandson transfers pt mostly. Pt with muscular dystrophy at baseline and pt continues to require assist for ADL due to weakness and pain. Discussion of home support and assistance required now that LLE is NWB. Pt minA to [place] for ADL tasks. Pt maxA +2 for stand/squat pivot transfer. Pt would benefit from continued OT skilled services for ADL, mobility and activity tolerance. OT following acutely.     Follow Up Recommendations  Home health OT;Supervision/Assistance - 24 hour    Equipment Recommendations  Other (comment)(hoyer lift with pads)    Recommendations for Other Services       Precautions / Restrictions Precautions Precautions: Fall Restrictions Weight Bearing Restrictions: Yes LLE Weight Bearing: Non weight bearing      Mobility Bed Mobility Overal bed mobility: Needs Assistance Bed Mobility: Rolling;Sidelying to Sit Rolling: Mod assist Sidelying to sit: Max assist;+2 for physical assistance;+2 for safety/equipment       General bed mobility comments: Good use of rails to roll L and R, mod assist to complete roll into fully sidelying; Max assist to help LEs off of the bed and elevate trunk to sit; Very unstable at initial sit, needing Max assist to stabilize  Transfers Overall transfer level: Needs assistance Equipment used: 2 person hand held assist(and bil support at gait belt) Transfers: Squat Pivot Transfers     Squat pivot transfers: +2 physical assistance;Max assist      General transfer comment: +2 Max assist for squat-pivot transfer OOB to recliner on her R side, with 2 bouts of effort; Noting pt did put some power up into her RLE to lift bottom and hips; max assist for stability, and to help keep NWB LLE    Balance Overall balance assessment: Needs assistance Sitting-balance support: Bilateral upper extremity supported;Feet supported Sitting balance-Leahy Scale: Poor Sitting balance - Comments: Needing heavy mod to minguard assist for sitting balance, and close guard of R knee to prevent slipping at EOB                                   ADL either performed or assessed with clinical judgement   ADL Overall ADL's : Needs assistance/impaired Eating/Feeding: Set up;Sitting;Bed level   Grooming: Moderate assistance;Sitting;Bed level   Upper Body Bathing: Moderate assistance;Bed level;Sitting   Lower Body Bathing: Maximal assistance;Total assistance;Cueing for safety;Sitting/lateral leans;Bed level   Upper Body Dressing : Moderate assistance   Lower Body Dressing: Maximal assistance   Toilet Transfer: Maximal assistance;+2 for physical assistance;+2 for safety/equipment   Toileting- Clothing Manipulation and Hygiene: Maximal assistance;+2 for physical assistance;+2 for safety/equipment;Sitting/lateral lean;Sit to/from stand Toileting - Clothing Manipulation Details (indicate cue type and reason): Pt using washcloth for anterior pericare after set-upA.     Functional mobility during ADLs: Maximal assistance;+2 for physical assistance;+2 for safety/equipment General ADL Comments: Pt with muscular dystrophy at baseline and pt continues to require assist for ADL due to weakness and pain.      Vision Baseline Vision/History: Wears glasses  Wears Glasses: At all times Vision Assessment?: No apparent visual deficits     Perception     Praxis      Pertinent Vitals/Pain Pain Assessment: 0-10 Pain Score: 3  Pain Location: L  knee/leg Pain Descriptors / Indicators: Aching Pain Intervention(s): Monitored during session     Hand Dominance Right   Extremity/Trunk Assessment Upper Extremity Assessment Upper Extremity Assessment: Generalized weakness;RUE deficits/detail;LUE deficits/detail RUE Deficits / Details: weakness noted L >R; 2/5 MM grade, poor grip RUE Coordination: decreased fine motor;decreased gross motor LUE Deficits / Details: weakness noted L >R; 2-/5 MM grade, poor grip LUE Coordination: decreased fine motor;decreased gross motor   Lower Extremity Assessment Lower Extremity Assessment: Defer to PT evaluation   Cervical / Trunk Assessment Cervical / Trunk Assessment: Other exceptions Cervical / Trunk Exceptions: Noted trunk weakness and tendency ot sit with upper and lower trunk flexed   Communication Communication Communication: No difficulties   Cognition Arousal/Alertness: Awake/alert Behavior During Therapy: WFL for tasks assessed/performed Overall Cognitive Status: Within Functional Limits for tasks assessed                                     General Comments  Discussion of home support and assistance required now that LLE is NWB    Exercises     Shoulder Instructions      Home Living Family/patient expects to be discharged to:: Private residence Living Arrangements: Spouse/significant other;Other relatives Available Help at Discharge: Family;Available 24 hours/day Type of Home: House Home Access: Level entry     Home Layout: Multi-level;Able to live on main level with bedroom/bathroom     Bathroom Shower/Tub: Occupational psychologist: Standard     Home Equipment: Hand held shower head;Bedside commode;Walker - 2 wheels;Cane - single point;Walker - 4 wheels;Wheelchair - manual          Prior Functioning/Environment Level of Independence: Needs assistance  Gait / Transfers Assistance Needed: Tells [place] she prefers using her cane for short  distance household amb; Also tells [place] her grandson and husband tend to give her LOTS of assist ("alley-oop") to transfer ADL's / Homemaking Assistance Needed: stands in the shower   Comments: SPC for mobility; does not navigate stairs at home        OT Problem List: Decreased strength;Decreased activity tolerance;Impaired balance (sitting and/or standing);Pain      OT Treatment/Interventions: Self-care/ADL training;Therapeutic exercise;Energy conservation;DME and/or AE instruction;Therapeutic activities;Patient/family education;Balance training    OT Goals(Current goals can be found in the care plan section) Acute Rehab OT Goals Patient Stated Goal: get home soon OT Goal Formulation: With patient Time For Goal Achievement: 10/07/19 Potential to Achieve Goals: Good ADL Goals Pt Will Perform Grooming: with set-up;sitting Pt Will Transfer to Toilet: with max assist;squat pivot transfer Pt/caregiver will Perform Home Exercise Program: Increased strength;Both right and left upper extremity;With minimal assist  OT Frequency: Min 2X/week   Barriers to D/C:            Co-evaluation PT/OT/SLP Co-Evaluation/Treatment: Yes Reason for Co-Treatment: Complexity of the patient's impairments (multi-system involvement);For patient/therapist safety;To address functional/ADL transfers   OT goals addressed during session: ADL's and self-care      AM-PAC OT "6 Clicks" Daily Activity     Outcome Measure Help from another person eating meals?: A Little Help from another person taking care of personal grooming?: A Lot Help from another person toileting, which includes using toliet,  bedpan, or urinal?: A Lot Help from another person bathing (including washing, rinsing, drying)?: A Lot Help from another person to put on and taking off regular upper body clothing?: A Little Help from another person to put on and taking off regular lower body clothing?: A Lot 6 Click Score: 14   End of Session  Equipment Utilized During Treatment: Gait belt Nurse Communication: Mobility status;Need for lift equipment  Activity Tolerance: Patient tolerated treatment well Patient left: in chair;with call bell/phone within reach;with bed alarm set  OT Visit Diagnosis: Unsteadiness on feet (R26.81);Muscle weakness (generalized) (M62.81);Pain Pain - Right/Left: Left Pain - part of body: Leg                Time: 0822-0915 OT Time Calculation (min): 53 min Charges:  OT General Charges $OT Visit: 1 Visit OT Evaluation $OT Eval Moderate Complexity: 1 Mod OT Treatments $Self Care/Home Management : 8-22 mins  Jefferey Pica OTR/L Acute Rehabilitation Services Pager: [phone] Office: [phone]   Asako Saliba C 09/23/2019, 5:02 PM

## 2019-09-23 NOTE — Plan of Care (Signed)

## 2019-09-23 NOTE — Progress Notes (Signed)
Orthopedic Tech Progress Note Patient Details:  Jessica Burke [birth year] [id number] Called in order to [employer] for an ROM knee brace unlocked  Patient ID: Jessica Burke, female   DOB: 1960-12-17, 59 y.o.   MRN: [id number]   Janit Pagan 09/23/2019, 9:53 AM

## 2019-09-23 NOTE — Progress Notes (Signed)
Lovenox Pt education kit given to Pt, demonstrated how to administer med to Pt and family member.

## 2019-09-24 ENCOUNTER — Encounter (HOSPITAL_COMMUNITY): Payer: Self-pay | Admitting: Orthopedic Surgery

## 2019-09-24 DIAGNOSIS — E559 Vitamin D deficiency, unspecified: Secondary | ICD-10-CM

## 2019-09-24 HISTORY — DX: Vitamin D deficiency, unspecified: E55.9

## 2019-09-24 LAB — CBC
HCT: 31.1 % — ABNORMAL LOW (ref 36.0–46.0)
Hemoglobin: 9.4 g/dL — ABNORMAL LOW (ref 12.0–15.0)
MCH: 27.1 pg (ref 26.0–34.0)
MCHC: 30.2 g/dL (ref 30.0–36.0)
MCV: 89.6 fL (ref 80.0–100.0)
Platelets: 196 10*3/uL (ref 150–400)
RBC: 3.47 MIL/uL — ABNORMAL LOW (ref 3.87–5.11)
RDW: 15.3 % (ref 11.5–15.5)
WBC: 17.3 10*3/uL — ABNORMAL HIGH (ref 4.0–10.5)
nRBC: 0 % (ref 0.0–0.2)

## 2019-09-24 LAB — URINE CULTURE

## 2019-09-24 MED ORDER — ENSURE ENLIVE PO LIQD
237.0000 mL | Freq: Two times a day (BID) | ORAL | Status: DC
  Administered 2019-09-24 – 2019-09-25 (×2): 237 mL via ORAL

## 2019-09-24 NOTE — Plan of Care (Signed)

## 2019-09-24 NOTE — Progress Notes (Signed)
Physical Therapy Treatment Patient Details Name: Jessica Burke MRN: [id number] DOB: 21-Mar-1961 Today's Date: 09/24/2019    History of Present Illness Pt is a 59 yo female s/p fall on L knee resulting in L tibia/fibula fx requiring ORIF of bicondylar tibial plateau and aspiration of L knee hemiarthrosis, NWB, no ROM restrictions. PMHx: muscular dystrophy, falls, depression, HTN, PNA.    PT Comments    Pt continues to require heavy physical assistance of two for functional mobility. Discussed use of Hoyer Lift for transfers upon d/c home. Focus of session was on bed mobility for pericare and in preparation to assist with pad placement for lift at home. Pt would continue to benefit from skilled physical therapy services at this time while admitted and after d/c to address the below listed limitations in order to improve overall safety and independence with functional mobility.   Follow Up Recommendations  Home health PT;Supervision/Assistance - 24 hour;Other (comment)(HHPT, HHOT and [facility] aide)     Equipment Recommendations  Other (comment)(HOYER LIFT)    Recommendations for Other Services       Precautions / Restrictions Precautions Precautions: Fall Restrictions Weight Bearing Restrictions: Yes LLE Weight Bearing: Non weight bearing    Mobility  Bed Mobility Overal bed mobility: Needs Assistance Bed Mobility: Rolling Rolling: Mod assist;+2 for physical assistance         General bed mobility comments: pt able to use bilateral UEs on bed rails to assist with rolling bilaterally, total A for pericare after small BM in bed  Transfers                 General transfer comment: discussed use of Maximove with pt and pt's family member in preparation for transitioning home  Ambulation/Gait                 Stairs             Wheelchair Mobility    Modified Rankin (Stroke Patients Only)       Balance                                             Cognition Arousal/Alertness: Awake/alert Behavior During Therapy: WFL for tasks assessed/performed Overall Cognitive Status: Within Functional Limits for tasks assessed                                        Exercises      General Comments        Pertinent Vitals/Pain Pain Assessment: Faces Faces Pain Scale: Hurts little more Pain Location: L knee/leg Pain Descriptors / Indicators: Aching Pain Intervention(s): Monitored during session;Repositioned    Home Living                      Prior Function            PT Goals (current goals can now be found in the care plan section) Acute Rehab PT Goals PT Goal Formulation: With patient Time For Goal Achievement: 10/07/19 Potential to Achieve Goals: Fair Progress towards PT goals: Progressing toward goals    Frequency    Min 3X/week      PT Plan Current plan remains appropriate    Co-evaluation  AM-PAC PT "6 Clicks" Mobility   Outcome Measure  Help needed turning from your back to your side while in a flat bed without using bedrails?: A Lot Help needed moving from lying on your back to sitting on the side of a flat bed without using bedrails?: A Lot Help needed moving to and from a bed to a chair (including a wheelchair)?: Total Help needed standing up from a chair using your arms (e.g., wheelchair or bedside chair)?: Total Help needed to walk in hospital room?: Total Help needed climbing 3-5 steps with a railing? : Total 6 Click Score: 8    End of Session   Activity Tolerance: Patient tolerated treatment well Patient left: in bed;with call bell/phone within reach;with bed alarm set;with family/visitor present Nurse Communication: Mobility status;Need for lift equipment PT Visit Diagnosis: Unsteadiness on feet (R26.81);Other abnormalities of gait and mobility (R26.89);History of falling (Z91.81);Pain Pain - Right/Left: Left Pain - part of body: Leg      Time: [id number] PT Time Calculation (min) (ACUTE ONLY): 20 min  Charges:  $Therapeutic Activity: 8-22 mins                     Anastasio Champion, DPT  Acute Rehabilitation Services Pager [phone] Office [place] 09/24/2019, 2:04 PM

## 2019-09-24 NOTE — Plan of Care (Signed)

## 2019-09-24 NOTE — Progress Notes (Signed)
Orthopaedic Trauma Service Progress Note  Patient ID: Jessica Burke MRN: [id number] DOB/AGE: 12/10/1960 59 y.o.  Subjective:  Doing well No complaints  Did well with therapy yesterday   No acute issues   Pain controlled  Appetite fair    ROS As above  Objective:   VITALS:   Vitals:   09/24/19 0300 09/24/19 0432 09/24/19 0437 09/24/19 0831  BP: (!) 93/59 (!) 88/60 108/78 109/83  Pulse: 100 (!) 102 (!) 102 68  Resp:    18  Temp: 97.8 F (36.6 C)   98.3 F (36.8 C)  TempSrc: Oral   Oral  SpO2: 100%   100%  Weight:      Height:        Estimated body mass index is 28.88 kg/m as calculated from the following:   Height as of this encounter: 5' 2.99" (1.6 m).   Weight as of this encounter: 73.9 kg.   Intake/Output      01/27 0701 - 01/28 0700 01/28 0701 - 01/29 0700   P.O. 600    I.V. (mL/kg) 988.2 (13.4)    IV Piggyback     Total Intake(mL/kg) 1588.2 (21.5)    Urine (mL/kg/hr)     Blood     Total Output     Net +1588.2         Urine Occurrence 3 x 1 x   Stool Occurrence 1 x 1 x     LABS  Results for orders placed or performed during the hospital encounter of 09/21/19 (from the past 24 hour(s))  Comprehensive metabolic panel     Status: Abnormal   Collection Time: 09/23/19 10:10 AM  Result Value Ref Range   Sodium 139 135 - 145 mmol/L   Potassium 3.1 (L) 3.5 - 5.1 mmol/L   Chloride 98 98 - 111 mmol/L   CO2 29 22 - 32 mmol/L   Glucose, Bld 81 70 - 99 mg/dL   BUN <5 (L) 6 - 20 mg/dL   Creatinine, Ser 0.33 (L) 0.44 - 1.00 mg/dL   Calcium 8.2 (L) 8.9 - 10.3 mg/dL   Total Protein 6.5 6.5 - 8.1 g/dL   Albumin 3.4 (L) 3.5 - 5.0 g/dL   AST 26 15 - 41 U/L   ALT 24 0 - 44 U/L   Alkaline Phosphatase 63 38 - 126 U/L   Total Bilirubin 1.1 0.3 - 1.2 mg/dL   GFR calc non Af Amer >60 >60 mL/min   GFR calc Af Amer >60 >60 mL/min   Anion gap 12 5 - 15  CBC     Status: Abnormal   Collection Time: 09/24/19  1:42 AM  Result Value Ref Range   WBC 17.3 (H) 4.0 - 10.5 K/uL   RBC 3.47 (L) 3.87 - 5.11 MIL/uL   Hemoglobin 9.4 (L) 12.0 - 15.0 g/dL   HCT 31.1 (L) 36.0 - 46.0 %   MCV 89.6 80.0 - 100.0 fL   MCH 27.1 26.0 - 34.0 pg   MCHC 30.2 30.0 - 36.0 g/dL   RDW 15.3 11.5 - 15.5 %   Platelets 196 150 - 400 K/uL   nRBC 0.0 0.0 - 0.2 %     PHYSICAL EXAM:   Gen: in bed, NAD, appears well  Lungs: unlabored Cardiac: reg Ext:  Left Lower extremity   Dressing c/d/i  Hinged brace fitting well  Ext warm   Motor and sensory functions intact  No DCT   Compartments soft, no pain with passive stretching   No acute changes in exam   Assessment/Plan: 2 Days Post-Op   Principal Problem:   Closed fracture of left proximal tibia Active Problems:   HTN (hypertension)   Polymyositis ([place])   Osteoporosis   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[place])   H/O corticosteroid therapy   Anti-infectives (From admission, onward)   Start     Dose/Rate Route Frequency Ordered Stop   09/22/19 1900  ceFAZolin (ANCEF) IVPB 1 g/50 mL premix     1 g 100 mL/hr over 30 Minutes Intravenous Every 6 hours 09/22/19 1644 09/23/19 0849   09/22/19 1240  ceFAZolin (ANCEF) 2-4 GM/100ML-% IVPB    Note to Pharmacy: Grace Blight   : cabinet override      09/22/19 1240 09/23/19 0044    .  POD/HD#: 31  59 year old black female with complex medical history including osteoporosis, muscular dystrophy polymyositis s/p insufficiency fracture of left proximal tibia   -Insufficiency fracture left proximal tibia s/p ORIF             Nonweightbearing 6 to 8 weeks             Unrestricted range of motion left knee and ankle             PT and OT evaluations             Dressing change tomorrow             continue with hinged knee brace              Aggressive ice and elevation for swelling and pain control                           PT- please teach HEP for L knee ROM- AROM, PROM. Prone exercises as well.  No ROM restrictions.  Quad sets, SLR, LAQ, SAQ, heel slides, stretching, prone flexion and extension                           Ankle theraband program, heel cord stretching, toe towel curls, etc                           No pillows under bend of knee when at rest, ok to place under heel to help work on extension. Can also use zero knee bone foam if available   - Pain management:             Continue with scheduled Tylenol and Norco as needed   - ABL anemia/Hemodynamics             Continue to monitor   - Medical issues              Continue home meds   - DVT/PE prophylaxis:             Lovenox x 21 days   - ID:              Perioperative antibiotics completed today   - Metabolic Bone Disease:             Osteoporosis  Does seem like her primary care physician is doing very good job in alternating her treatment regimen unfortunately patient sustained a fracture.  Patient has extended her PTH analog use by being on Tymlos for 2 years so this eliminates this class of medication from treatment protocol.  Do think that Prolia is a great option for her.  However we could consider using Evenity on her if she requires an additional anabolic agent.  I also do think that this insufficiency fracture is related to some lingering effects from bisphosphonates given its 10-year half-life.             Vitamin D levels are pending             Continue to maximize nutrition and weightbearing activities as weightbearing activities will have much greater impact on her overall bone strength   - Activity:             Up with therapies   - FEN/GI prophylaxis/Foley/Lines:             Regular diet             Foley discontinued                         Do not use pure wick             Continue with IV and IV fluids   - Impediments to fracture healing:             Autoimmune disease             History of chronic steroid use             Known osteoporosis             Limited  mobility/weightbearing activity   - Dispo:            home tomorrow  Arrange DME     Jari Pigg, PA-C [phone] (C) 09/24/2019, 9:25 AM  [facility] [place] [id number] [phone] Jenetta [phone] (F)   After 6pm on weekdays please call office number to get in touch with on call provider or refer to [place] and look to see who is on call for the Sports Medicine Call Group which is listed under orthopaedics   On Weekends please call office number to get in touch with on call provider or refer to [place] and look to see who is on call for the Sports Medicine Call Group which is listed under orthopaedics

## 2019-09-25 LAB — COMPREHENSIVE METABOLIC PANEL
ALT: 15 U/L (ref 0–44)
AST: 14 U/L — ABNORMAL LOW (ref 15–41)
Albumin: 2.5 g/dL — ABNORMAL LOW (ref 3.5–5.0)
Alkaline Phosphatase: 50 U/L (ref 38–126)
Anion gap: 7 (ref 5–15)
BUN: 9 mg/dL (ref 6–20)
CO2: 35 mmol/L — ABNORMAL HIGH (ref 22–32)
Calcium: 8.9 mg/dL (ref 8.9–10.3)
Chloride: 99 mmol/L (ref 98–111)
Creatinine, Ser: <0.3 mg/dL — ABNORMAL LOW (ref 0.44–1.00)
Glucose, Bld: 118 mg/dL — ABNORMAL HIGH (ref 70–99)
Potassium: 4.1 mmol/L (ref 3.5–5.1)
Sodium: 141 mmol/L (ref 135–145)
Total Bilirubin: 0.6 mg/dL (ref 0.3–1.2)
Total Protein: 5.3 g/dL — ABNORMAL LOW (ref 6.5–8.1)

## 2019-09-25 LAB — CBC
HCT: 30.1 % — ABNORMAL LOW (ref 36.0–46.0)
Hemoglobin: 9.2 g/dL — ABNORMAL LOW (ref 12.0–15.0)
MCH: 27.5 pg (ref 26.0–34.0)
MCHC: 30.6 g/dL (ref 30.0–36.0)
MCV: 90.1 fL (ref 80.0–100.0)
Platelets: 193 10*3/uL (ref 150–400)
RBC: 3.34 MIL/uL — ABNORMAL LOW (ref 3.87–5.11)
RDW: 15.4 % (ref 11.5–15.5)
WBC: 15.1 10*3/uL — ABNORMAL HIGH (ref 4.0–10.5)
nRBC: 0 % (ref 0.0–0.2)

## 2019-09-25 LAB — URINE CULTURE: Culture: NO GROWTH

## 2019-09-25 LAB — MAGNESIUM: Magnesium: 1.9 mg/dL (ref 1.7–2.4)

## 2019-09-25 MED ORDER — ASCORBIC ACID 500 MG PO TABS: 500.0000 mg | ORAL_TABLET | Freq: Every day | ORAL | 1 refills | Status: AC

## 2019-09-25 MED ORDER — MUPIROCIN 2 % EX OINT: 1.0000 "application " | TOPICAL_OINTMENT | Freq: Two times a day (BID) | CUTANEOUS | 0 refills | Status: DC

## 2019-09-25 MED ORDER — HYDROCODONE-ACETAMINOPHEN 5-325 MG PO TABS: 1.0000 | ORAL_TABLET | Freq: Two times a day (BID) | ORAL | 0 refills | Status: DC | PRN

## 2019-09-25 MED ORDER — VITAMIN D 125 MCG (5000 UT) PO CAPS: 1.0000 | ORAL_CAPSULE | Freq: Every day | ORAL | 3 refills | Status: DC

## 2019-09-25 MED ORDER — ACETAMINOPHEN 500 MG PO TABS: 500.0000 mg | ORAL_TABLET | Freq: Three times a day (TID) | ORAL | 0 refills | Status: AC

## 2019-09-25 MED ORDER — ENOXAPARIN SODIUM 40 MG/0.4ML ~~LOC~~ SOLN: 40.0000 mg | SUBCUTANEOUS | 0 refills | Status: DC

## 2019-09-25 MED ORDER — ENSURE ENLIVE PO LIQD: 237.0000 mL | Freq: Two times a day (BID) | ORAL | 12 refills | Status: DC

## 2019-09-25 MED ORDER — DOCUSATE SODIUM 100 MG PO CAPS: 100.0000 mg | ORAL_CAPSULE | Freq: Two times a day (BID) | ORAL | 0 refills | Status: DC

## 2019-09-25 NOTE — Discharge Summary (Signed)
Orthopaedic Trauma Service (OTS) Discharge Summary   Patient ID: Jessica Burke MRN: [id number] DOB/AGE: 61-Mar-1962 61 y.o.  Admit date: 09/21/2019 Discharge date: 09/25/2019  Admission Diagnoses:   Closed fracture of left proximal tibia   HTN (hypertension)   Polymyositis ([place])   Osteoporosis   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[place])   H/O corticosteroid therapy   Vitamin D deficiency  Discharge Diagnoses:  Principal Problem:   Closed fracture of left proximal tibia Active Problems:   HTN (hypertension)   Polymyositis ([facility])   Osteoporosis   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[place])   H/O corticosteroid therapy   Vitamin D deficiency   Past Medical History:  Diagnosis Date  . Anemia   . Arthritis   . Chest pain 12/2016  . Depression    2003  . H/O corticosteroid therapy 09/23/2019  . Hypertension   . 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[facility])   . Osteoporosis   . Pneumonia    2016  . Polymyositis ([place])   . Polymyositis ([place]) 12/2016  . Shortness of breath dyspnea   . Vitamin D deficiency 09/24/2019     Procedures Performed: 09/22/2019-Dr. Marcelino Scot 1. OPEN REDUCTION INTERNAL FIXATION (ORIF) BICONDYLAR TIBIAL PLATEAU (Left)  Discharged Condition: good  Hospital Course:   Patient is a 59 year old female with history of polymyositis, muscular dystrophy history of corticosteroid therapy, gait disturbance who primarily uses a wheelchair in the community and a walker or cane around the house who sustained a fall on 09/21/2019.  Patient was brought to [facility] long hospital where she was found to have a left proximal tibia fracture she was admitted to the orthopedic service due to the complexity of the injury the orthopedic trauma service was consulted for definitive management.  She was subsequently transferred over to [facility] for surgical intervention.  Patient underwent surgical intervention on 09/22/2019 for the procedure noted above.  After surgery patient was transferred to the  PACU for recovery from anesthesia and then transferred to the orthopedic floor for observation, pain control and therapies.  Patient's hospital stay was really uncomplicated.  No major issues were noted during her stay.  She was monitored closely.  She was covered with Ancef for perioperative antibiotic coverage.  She was started on Lovenox for DVT and PE prophylaxis on postoperative day #1.  Patient was participatory with therapy on postoperative day #1.  Again no major issues noted.  All DME was arranged.  Ultimately patient was deemed stable for discharge to home on postoperative day #3 which 09/25/2019  Patient discharged in stable condition.  Pain is well controlled with Tylenol.  We did discuss her bone health which she is completely aware that she has poor bone quality.  She has been on various medications for her bone health including bisphosphonates, Tymlos and most recently she has been on Prolia for about a year.  Will continue to work on this that is a complicated situation given her autoimmune disease along with medications associated with treating her autoimmune disease.  During her hospitalization we did obtain some basic metabolic bone labs primarily her vitamin D levels which do show vitamin D deficiency.  We will maximize and optimize this.  She will be placed on 5000 international units vitamin D3 at discharge.  She also be on vitamin C 500 mg daily as well.  Patient remain nonweightbearing on her left lower extremity for 8 weeks.  She has unrestricted range of motion of her left knee.  Hinged knee brace was obtained for the patient.  She can be out of this when she is in bed or in chair and only needs to wear it when mobilizing.  Consults: None  Significant Diagnostic Studies: labs:  Results for NUALA, WOEHRLE (MRN [id number]) as of 09/25/2019 11:20  Ref. Range 09/25/2019 02:43  COMPREHENSIVE METABOLIC PANEL Unknown Rpt (A)  Sodium Latest Ref Range: 135 - 145 mmol/L 141  Potassium  Latest Ref Range: 3.5 - 5.1 mmol/L 4.1  Chloride Latest Ref Range: 98 - 111 mmol/L 99  CO2 Latest Ref Range: 22 - 32 mmol/L 35 (H)  Glucose Latest Ref Range: 70 - 99 mg/dL 118 (H)  BUN Latest Ref Range: 6 - 20 mg/dL 9  Creatinine Latest Ref Range: 0.44 - 1.00 mg/dL <0.30 (L)  Calcium Latest Ref Range: 8.9 - 10.3 mg/dL 8.9  Anion gap Latest Ref Range: 5 - 15  7  Magnesium Latest Ref Range: 1.7 - 2.4 mg/dL 1.9  Alkaline Phosphatase Latest Ref Range: 38 - 126 U/L 50  Albumin Latest Ref Range: 3.5 - 5.0 g/dL 2.5 (L)  AST Latest Ref Range: 15 - 41 U/L 14 (L)  ALT Latest Ref Range: 0 - 44 U/L 15  Total Protein Latest Ref Range: 6.5 - 8.1 g/dL 5.3 (L)  Total Bilirubin Latest Ref Range: 0.3 - 1.2 mg/dL 0.6  GFR, Est Non African American Latest Ref Range: >60 mL/min NOT CALCULATED  GFR, Est African American Latest Ref Range: >60 mL/min NOT CALCULATED  WBC Latest Ref Range: 4.0 - 10.5 K/uL 15.1 (H)  RBC Latest Ref Range: 3.87 - 5.11 MIL/uL 3.34 (L)  Hemoglobin Latest Ref Range: 12.0 - 15.0 g/dL 9.2 (L)  HCT Latest Ref Range: 36.0 - 46.0 % 30.1 (L)  MCV Latest Ref Range: 80.0 - 100.0 fL 90.1  MCH Latest Ref Range: 26.0 - 34.0 pg 27.5  MCHC Latest Ref Range: 30.0 - 36.0 g/dL 30.6  RDW Latest Ref Range: 11.5 - 15.5 % 15.4  Platelets Latest Ref Range: 150 - 400 K/uL 193  nRBC Latest Ref Range: 0.0 - 0.2 % 0.0  Results for LANISA, ACAMPORA (MRN [id number]) as of 09/25/2019 11:20  Ref. Range 09/23/2019 01:58  Vitamin D, 25-Hydroxy Latest Ref Range: 30 - 100 ng/mL 18.58 (L)    Treatments: IV hydration, antibiotics: Ancef, analgesia: Tylenol and Norco, anticoagulation: LMW heparin, therapies: PT, OT, RN and SW and surgery: As above  Discharge Exam:           Orthopaedic Trauma Service Progress Note   Patient ID: Jessica Burke MRN: [id number] DOB/AGE: 1960/09/01 59 y.o.   Subjective:   Doing well Ready to go home Pain controlled No new issues     ROS As above   Objective:      VITALS:         Vitals:    09/24/19 2052 09/25/19 0300 09/25/19 0821 09/25/19 0823  BP: 95/63 99/67 114/84 112/76  Pulse: 98 99 96 98  Resp: 17   18    Temp: 98.2 F (36.8 C) 97.8 F (36.6 C) 98.3 F (36.8 C) 98.2 F (36.8 C)  TempSrc: Oral Oral Oral Oral  SpO2: 100% 100%   100%  Weight:          Height:              Estimated body mass index is 28.88 kg/m as calculated from the following:   Height as of this encounter: 5' 2.99" (1.6 m).   Weight as of this encounter: 73.9 kg.  Intake/Output      01/28 0701 - 01/29 0700 01/29 0701 - 01/30 0700   P.O. 720 240   I.V. (mL/kg)     Total Intake(mL/kg) 720 (9.7) 240 (3.2)   Net +720 +240        Urine Occurrence 4 x 1 x   Stool Occurrence 1 x       LABS   Lab Results Last 24 Hours        Results for orders placed or performed during the hospital encounter of 09/21/19 (from the past 24 hour(s))  Culture, Urine     Status: None    Collection Time: 09/24/19 11:38 AM    Specimen: Urine, Clean Catch  Result Value Ref Range    Specimen Description URINE, CLEAN CATCH      Special Requests NONE      Culture          NO GROWTH Performed at [facility], [place] [address] [place], [place] [address]      Report Status 09/25/2019 FINAL    Comprehensive metabolic panel     Status: Abnormal    Collection Time: 09/25/19  2:43 AM  Result Value Ref Range    Sodium 141 135 - 145 mmol/L    Potassium 4.1 3.5 - 5.1 mmol/L    Chloride 99 98 - 111 mmol/L    CO2 35 (H) 22 - 32 mmol/L    Glucose, Bld 118 (H) 70 - 99 mg/dL    BUN 9 6 - 20 mg/dL    Creatinine, Ser <0.30 (L) 0.44 - 1.00 mg/dL    Calcium 8.9 8.9 - 10.3 mg/dL    Total Protein 5.3 (L) 6.5 - 8.1 g/dL    Albumin 2.5 (L) 3.5 - 5.0 g/dL    AST 14 (L) 15 - 41 U/L    ALT 15 0 - 44 U/L    Alkaline Phosphatase 50 38 - 126 U/L    Total Bilirubin 0.6 0.3 - 1.2 mg/dL    GFR calc non Af Amer NOT CALCULATED >60 mL/min    GFR calc Af Amer NOT CALCULATED >60 mL/min    Anion  gap 7 5 - 15  Magnesium     Status: None    Collection Time: 09/25/19  2:43 AM  Result Value Ref Range    Magnesium 1.9 1.7 - 2.4 mg/dL  CBC     Status: Abnormal    Collection Time: 09/25/19  2:43 AM  Result Value Ref Range    WBC 15.1 (H) 4.0 - 10.5 K/uL    RBC 3.34 (L) 3.87 - 5.11 MIL/uL    Hemoglobin 9.2 (L) 12.0 - 15.0 g/dL    HCT 30.1 (L) 36.0 - 46.0 %    MCV 90.1 80.0 - 100.0 fL    MCH 27.5 26.0 - 34.0 pg    MCHC 30.6 30.0 - 36.0 g/dL    RDW 15.4 11.5 - 15.5 %    Platelets 193 150 - 400 K/uL    nRBC 0.0 0.0 - 0.2 %          PHYSICAL EXAM:    Gen: in bed, NAD, appears well  Lungs: unlabored Cardiac: reg Ext:       Left Lower extremity              Dressing c/d/i                         Dressings removed  All incisions look great                         No signs of infection              Hinged brace fitting well             Ext warm              Motor and sensory functions intact             No DCT              Compartments soft, no pain with passive stretching    Assessment/Plan: 3 Days Post-Op    Principal Problem:   Closed fracture of left proximal tibia Active Problems:   HTN (hypertension)   Polymyositis ([facility])   Osteoporosis   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[place])   H/O corticosteroid therapy   Vitamin D deficiency                Anti-infectives (From admission, onward)      Start     Dose/Rate Route Frequency Ordered Stop    09/22/19 1900   ceFAZolin (ANCEF) IVPB 1 g/50 mL premix     1 g 100 mL/hr over 30 Minutes Intravenous Every 6 hours 09/22/19 1644 09/23/19 0849    09/22/19 1240   ceFAZolin (ANCEF) 2-4 GM/100ML-% IVPB    Note to Pharmacy: Grace Blight   : cabinet override         09/22/19 1240 09/23/19 0044       .   POD/HD#: 62  59 year old black female with complex medical history including osteoporosis, muscular dystrophy polymyositis s/p insufficiency fracture of left proximal tibia   -Insufficiency fracture left  proximal tibia s/p ORIF             Nonweightbearing 6 to 8 weeks             Unrestricted range of motion left knee and ankle             PT and OT evaluations             Dressing changed today                         Start daily dressing changes on 09/27/2019                         Ok to leave dressings off when there is no drainage                         Ok to clean with soap and water only              continue with hinged knee brace              Aggressive ice and elevation for swelling and pain control                           PT- please teach HEP for L knee ROM- AROM, PROM. Prone exercises as well. No ROM restrictions.  Quad sets, SLR, LAQ, SAQ, heel slides, stretching, prone flexion and extension                           Ankle theraband program, heel cord  stretching, toe towel curls, etc                           No pillows under bend of knee when at rest, ok to place under heel to help work on extension. Can also use zero knee bone foam if available   - Pain management:             Continue with scheduled Tylenol and Norco as needed   - ABL anemia/Hemodynamics            stable   - Medical issues              Continue home meds   - DVT/PE prophylaxis:             Lovenox x 21 days   - ID:              Perioperative antibiotics completed   - Metabolic Bone Disease:             Osteoporosis                         Does seem like her primary care physician is doing very good job in alternating her treatment regimen unfortunately patient sustained a fracture.  Patient has extended her PTH analog use by being on Tymlos for 2 years so this eliminates this class of medication from treatment protocol.  Do think that Prolia is a great option for her.  However we could consider using Evenity on her if she requires an additional anabolic agent.  I also do think that this insufficiency fracture is related to some lingering effects from bisphosphonates given its 10-year half-life.              Vitamin D levels are pending             Continue to maximize nutrition and weightbearing activities as weightbearing activities will have much greater impact on her overall bone strength   - Activity:             Up with therapies   - FEN/GI prophylaxis/Foley/Lines:             Regular diet             Foley discontinued                         Do not use pure wick             Continue with IV and IV fluids   - Impediments to fracture healing:             Autoimmune disease             History of chronic steroid use             Known osteoporosis             Limited mobility/weightbearing activity   - Dispo:            dc home today             Follow up with ortho in 10-14 days   Disposition: Discharge disposition: 01-Home or Self Care       Discharge Instructions    Call MD / Call 911   Complete by: As directed    If you experience chest pain or shortness of breath, CALL 911 and  be transported to the hospital emergency room.  If you develope a fever above 101 F, pus (white drainage) or increased drainage or redness at the wound, or calf pain, call your surgeon's office.   Constipation Prevention   Complete by: As directed    Drink plenty of fluids.  Prune juice may be helpful.  You may use a stool softener, such as Colace (over the counter) 100 mg twice a day.  Use MiraLax (over the counter) for constipation as needed.   Diet general   Complete by: As directed    Discharge instructions   Complete by: As directed    Orthopaedic Trauma Service Discharge Instructions   General Discharge Instructions  Orthopaedic Injuries:  Left proximal tibia fracture treated with open reduction and internal fixation using plate and screws  WEIGHT BEARING STATUS: Nonweightbearing Left leg  RANGE OF MOTION/ACTIVITY: unrestricted range of motion Left knee and ankle  Bone health:  Labs show vitamin d deficiency. Continue to take vitamin d and vitamin c   Wound Care:  daily dressing changes starting on 09/27/2019. See below  Discharge Wound Care Instructions  Do NOT apply any ointments, solutions or lotions to pin sites or surgical wounds.  These prevent needed drainage and even though solutions like hydrogen peroxide kill bacteria, they also damage cells lining the pin sites that help fight infection.  Applying lotions or ointments can keep the wounds moist and can cause them to breakdown and open up as well. This can increase the risk for infection. When in doubt call the office.  Surgical incisions should be dressed daily.  If any drainage is noted, use one layer of adaptic, then gauze, Kerlix, and an ace wrap.  Alternatively you can use a Mepilex type dressing instead of gauze and Adaptic.  Continue to use Ace wrap or compression sock to help with swelling control  Once the incision is completely dry and without drainage, it may be left open to air out.  Showering may begin 36-48 hours later.  Cleaning gently with soap and water.  DVT/PE prophylaxis: Lovenox 40 mg subcutaneous injection daily x 21 days  Diet: as you were eating previously.  Can use over the counter stool softeners and bowel preparations, such as Miralax, to help with bowel movements.  Narcotics can be constipating.  Be sure to drink plenty of fluids  PAIN MEDICATION USE AND EXPECTATIONS  You have likely been given narcotic medications to help control your pain.  After a traumatic event that results in an fracture (broken bone) with or without surgery, it is ok to use narcotic pain medications to help control one's pain.  We understand that everyone responds to pain differently and each individual patient will be evaluated on a regular basis for the continued need for narcotic medications. Ideally, narcotic medication use should last no more than 6-8 weeks (coinciding with fracture healing).   As a patient it is your responsibility as well to monitor narcotic medication use and report the  amount and frequency you use these medications when you come to your office visit.   We would also advise that if you are using narcotic medications, you should take a dose prior to therapy to maximize you participation.  IF YOU ARE ON NARCOTIC MEDICATIONS IT IS NOT PERMISSIBLE TO OPERATE A MOTOR VEHICLE (MOTORCYCLE/CAR/TRUCK/MOPED) OR HEAVY MACHINERY DO NOT MIX NARCOTICS WITH OTHER CNS (CENTRAL NERVOUS SYSTEM) DEPRESSANTS SUCH AS ALCOHOL   STOP SMOKING OR USING NICOTINE PRODUCTS!!!!  As discussed nicotine severely impairs  your body's ability to heal surgical and traumatic wounds but also impairs bone healing.  Wounds and bone heal by forming microscopic blood vessels (angiogenesis) and nicotine is a vasoconstrictor (essentially, shrinks blood vessels).  Therefore, if vasoconstriction occurs to these microscopic blood vessels they essentially disappear and are unable to deliver necessary nutrients to the healing tissue.  This is one modifiable factor that you can do to dramatically increase your chances of healing your injury.    (This means no smoking, no nicotine gum, patches, etc)  DO NOT USE NONSTEROIDAL ANTI-INFLAMMATORY DRUGS (NSAID'S)  Using products such as Advil (ibuprofen), Aleve (naproxen), Motrin (ibuprofen) for additional pain control during fracture healing can delay and/or prevent the healing response.  If you would like to take over the counter (OTC) medication, Tylenol (acetaminophen) is ok.  However, some narcotic medications that are given for pain control contain acetaminophen as well. Therefore, you should not exceed more than 4000 mg of tylenol in a day if you do not have liver disease.  Also note that there are may OTC medicines, such as cold medicines and allergy medicines that my contain tylenol as well.  If you have any questions about medications and/or interactions please ask your doctor/PA or your pharmacist.      ICE AND ELEVATE INJURED/OPERATIVE EXTREMITY  Using ice and  elevating the injured extremity above your heart can help with swelling and pain control.  Icing in a pulsatile fashion, such as 20 minutes on and 20 minutes off, can be followed.    Do not place ice directly on skin. Make sure there is a barrier between to skin and the ice pack.    Using frozen items such as frozen peas works well as the conform nicely to the are that needs to be iced.  USE AN ACE WRAP OR TED HOSE FOR SWELLING CONTROL  In addition to icing and elevation, Ace wraps or TED hose are used to help limit and resolve swelling.  It is recommended to use Ace wraps or TED hose until you are informed to stop.    When using Ace Wraps start the wrapping distally (farthest away from the body) and wrap proximally (closer to the body)   Example: If you had surgery on your leg or thing and you do not have a splint on, start the ace wrap at the toes and work your way up to the thigh        If you had surgery on your upper extremity and do not have a splint on, start the ace wrap at your fingers and work your way up to the upper arm  IF YOU ARE IN A SPLINT OR CAST DO NOT [place]   If your splint gets wet for any reason please contact the office immediately. You may shower in your splint or cast as long as you keep it dry.  This can be done by wrapping in a cast cover or garbage back (or similar)  Do Not stick any thing down your splint or cast such as pencils, money, or hangers to try and scratch yourself with.  If you feel itchy take benadryl as prescribed on the bottle for itching  IF YOU ARE IN A CAM BOOT (BLACK BOOT)  You may remove boot periodically. Perform daily dressing changes as noted below.  Wash the liner of the boot regularly and wear a sock when wearing the boot. It is recommended that you sleep in the boot until told  otherwise    Call office for the following: Temperature greater than 101F Persistent nausea and vomiting Severe uncontrolled pain Redness,  tenderness, or signs of infection (pain, swelling, redness, odor or green/yellow discharge around the site) Difficulty breathing, headache or visual disturbances Hives Persistent dizziness or light-headedness Extreme fatigue Any other questions or concerns you may have after discharge  In an emergency, call 911 or go to an Emergency Department at a nearby hospital    [place]: [phone]   VISIT OUR WEBSITE FOR ADDITIONAL INFORMATION: [email or web address]   Do not put a pillow under the knee. Place it under the heel.   Complete by: As directed    Driving restrictions   Complete by: As directed    No driving   Increase activity slowly as tolerated   Complete by: As directed    Non weight bearing   Complete by: As directed    Laterality: left   Extremity: Lower     Allergies as of 09/25/2019   No Known Allergies     Medication List    TAKE these medications   acetaminophen 500 MG tablet Commonly known as: TYLENOL Take 1 tablet (500 mg total) by mouth every 8 (eight) hours.   Acthar 80 UNIT/ML injectable gel Generic drug: corticotropin Inject 80 Units into the skin See admin instructions. Tuesday and Thursday one week and Wednesday and Friday then next week   ascorbic acid 500 MG tablet Commonly known as: VITAMIN C Take 1 tablet (500 mg total) by mouth daily. Start taking on: September 26, 2019   denosumab 60 MG/ML Sosy injection Commonly known as: PROLIA Inject 60 mg into the skin every 6 (six) months.   docusate sodium 100 MG capsule Commonly known as: COLACE Take 1 capsule (100 mg total) by mouth 2 (two) times daily.   enoxaparin 40 MG/0.4ML injection Commonly known as: LOVENOX Inject 0.4 mLs (40 mg total) into the skin daily for 21 days. Start taking on: September 26, 2019   feeding supplement (ENSURE ENLIVE) Liqd Take 237 mLs by mouth 2 (two) times daily between meals.   HYDROcodone-acetaminophen 5-325 MG  tablet Commonly known as: NORCO/VICODIN Take 1-2 tablets by mouth every 12 (twelve) hours as needed for moderate pain or severe pain (breakthrough pain).   multivitamin tablet Take 1 tablet by mouth daily.   mupirocin ointment 2 % Commonly known as: BACTROBAN Place 1 application into the nose 2 (two) times daily.   RITUXAN IV Inject into the vein every 30 (thirty) days.   telmisartan-hydrochlorothiazide 80-12.5 MG tablet Commonly known as: MICARDIS HCT Take 1 tablet by mouth daily.   Vitamin D 125 MCG (5000 UT) Caps Take 1 capsule by mouth daily.            Discharge Care Instructions  (From admission, onward)         Start     Ordered   09/25/19 0000  Non weight bearing    Question Answer Comment  Laterality left   Extremity Lower      09/25/19 1123         Follow-up Information    Altamese , MD. Schedule an appointment as soon as possible for a visit in 75 day(s).   Specialty: Orthopedic Surgery Contact information: [place] [address] [phone]           Discharge Instructions and Plan:  Patient sustained a significant injury to her left proximal tibia we were able  to achieve excellent fixation via plate osteosynthesis.  We anticipate patient should regain full functionality.  Her gait and balance is compromised at baseline but we do think that she will be able to ambulate again.  She will be nonweightbearing for 8 weeks given her autoimmune disease with associated osteoporosis.  She should continue her Prolia.  Do not consider this fracture failure of her Prolia.  She will need a updated bone density scan and if she does need to be placed on another anabolic agent she will need to be placed on Evenity.  She is already exhausted the parathyroid hormone arm of antibiotic treatment.  She has unrestricted range of motion of her knee and ankle.  Home exercises were reviewed with the patient by PT and OT.  She should continue to do  those regularly as well as participate with home therapy PT.  When she begins weightbearing exercises we will refer her to outpatient therapy.  She will be on Lovenox for DVT and PE prophylaxis for another 21 days  She will remain on vitamin D and vitamin C supplementation for her osteoporosis and vitamin D deficiency  She will follow-up in the office in 10 to 14 days for removal of sutures and follow-up x-rays.  Patient is encouraged to contact the office for any questions or concerns.  She is aware of any signs or symptoms that would necessitate a call to the office or visit to the emergency department  Signed:  Jari Pigg, PA-C [phone] (C) 09/25/2019, 11:23 AM  [facility] [place] [id number] [phone] Dom[place] Sep (F)

## 2019-09-25 NOTE — Progress Notes (Signed)
Orthopaedic Trauma Service Progress Note  Patient ID: Jessica Burke MRN: [id number] DOB/AGE: 1961-05-24 59 y.o.  Subjective:  Doing well Ready to go home Pain controlled No new issues   ROS As above  Objective:   VITALS:   Vitals:   09/24/19 2052 09/25/19 0300 09/25/19 0821 09/25/19 0823  BP: 95/63 99/67 114/84 112/76  Pulse: 98 99 96 98  Resp: 17  18   Temp: 98.2 F (36.8 C) 97.8 F (36.6 C) 98.3 F (36.8 C) 98.2 F (36.8 C)  TempSrc: Oral Oral Oral Oral  SpO2: 100% 100%  100%  Weight:      Height:        Estimated body mass index is 28.88 kg/m as calculated from the following:   Height as of this encounter: 5' 2.99" (1.6 m).   Weight as of this encounter: 73.9 kg.   Intake/Output      01/28 0701 - 01/29 0700 01/29 0701 - 01/30 0700   P.O. 720 240   I.V. (mL/kg)     Total Intake(mL/kg) 720 (9.7) 240 (3.2)   Net +720 +240        Urine Occurrence 4 x 1 x   Stool Occurrence 1 x      LABS  Results for orders placed or performed during the hospital encounter of 09/21/19 (from the past 24 hour(s))  Culture, Urine     Status: None   Collection Time: 09/24/19 11:38 AM   Specimen: Urine, Clean Catch  Result Value Ref Range   Specimen Description URINE, CLEAN CATCH    Special Requests NONE    Culture      NO GROWTH Performed at [facility], [place] [address] [place], [place] [address]    Report Status 09/25/2019 FINAL   Comprehensive metabolic panel     Status: Abnormal   Collection Time: 09/25/19  2:43 AM  Result Value Ref Range   Sodium 141 135 - 145 mmol/L   Potassium 4.1 3.5 - 5.1 mmol/L   Chloride 99 98 - 111 mmol/L   CO2 35 (H) 22 - 32 mmol/L   Glucose, Bld 118 (H) 70 - 99 mg/dL   BUN 9 6 - 20 mg/dL   Creatinine, Ser <0.30 (L) 0.44 - 1.00 mg/dL   Calcium 8.9 8.9 - 10.3 mg/dL   Total Protein 5.3 (L) 6.5 - 8.1 g/dL   Albumin 2.5 (L) 3.5 - 5.0 g/dL   AST 14 (L)  15 - 41 U/L   ALT 15 0 - 44 U/L   Alkaline Phosphatase 50 38 - 126 U/L   Total Bilirubin 0.6 0.3 - 1.2 mg/dL   GFR calc non Af Amer NOT CALCULATED >60 mL/min   GFR calc Af Amer NOT CALCULATED >60 mL/min   Anion gap 7 5 - 15  Magnesium     Status: None   Collection Time: 09/25/19  2:43 AM  Result Value Ref Range   Magnesium 1.9 1.7 - 2.4 mg/dL  CBC     Status: Abnormal   Collection Time: 09/25/19  2:43 AM  Result Value Ref Range   WBC 15.1 (H) 4.0 - 10.5 K/uL   RBC 3.34 (L) 3.87 - 5.11 MIL/uL   Hemoglobin 9.2 (L) 12.0 - 15.0 g/dL   HCT 30.1 (L) 36.0 - 46.0 %  MCV 90.1 80.0 - 100.0 fL   MCH 27.5 26.0 - 34.0 pg   MCHC 30.6 30.0 - 36.0 g/dL   RDW 15.4 11.5 - 15.5 %   Platelets 193 150 - 400 K/uL   nRBC 0.0 0.0 - 0.2 %     PHYSICAL EXAM:   Gen: in bed, NAD, appears well  Lungs: unlabored Cardiac: reg Ext:       Left Lower extremity              Dressing c/d/i   Dressings removed   All incisions look great   No signs of infection              Hinged brace fitting well             Ext warm              Motor and sensory functions intact             No DCT              Compartments soft, no pain with passive stretching   Assessment/Plan: 3 Days Post-Op   Principal Problem:   Closed fracture of left proximal tibia Active Problems:   HTN (hypertension)   Polymyositis (HCC)   Osteoporosis   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[place])   H/O corticosteroid therapy   Vitamin D deficiency   Anti-infectives (From admission, onward)   Start     Dose/Rate Route Frequency Ordered Stop   09/22/19 1900  ceFAZolin (ANCEF) IVPB 1 g/50 mL premix     1 g 100 mL/hr over 30 Minutes Intravenous Every 6 hours 09/22/19 1644 09/23/19 0849   09/22/19 1240  ceFAZolin (ANCEF) 2-4 GM/100ML-% IVPB    Note to Pharmacy: Grace Blight   : cabinet override      09/22/19 1240 09/23/19 0044    .  POD/HD#: 52  59 year old black female with complex medical history including osteoporosis, muscular  dystrophy polymyositis s/p insufficiency fracture of left proximal tibia   -Insufficiency fracture left proximal tibia s/p ORIF             Nonweightbearing 6 to 8 weeks             Unrestricted range of motion left knee and ankle             PT and OT evaluations             Dressing changed today   Start daily dressing changes on 09/27/2019   Ok to leave dressings off when there is no drainage   Ok to clean with soap and water only              continue with hinged knee brace              Aggressive ice and elevation for swelling and pain control                           PT- please teach HEP for L knee ROM- AROM, PROM. Prone exercises as well. No ROM restrictions.  Quad sets, SLR, LAQ, SAQ, heel slides, stretching, prone flexion and extension                           Ankle theraband program, heel cord stretching, toe towel curls, etc  No pillows under bend of knee when at rest, ok to place under heel to help work on extension. Can also use zero knee bone foam if available   - Pain management:             Continue with scheduled Tylenol and Norco as needed   - ABL anemia/Hemodynamics            stable   - Medical issues              Continue home meds   - DVT/PE prophylaxis:             Lovenox x 21 days   - ID:              Perioperative antibiotics completed   - Metabolic Bone Disease:             Osteoporosis                         Does seem like her primary care physician is doing very good job in alternating her treatment regimen unfortunately patient sustained a fracture.  Patient has extended her PTH analog use by being on Tymlos for 2 years so this eliminates this class of medication from treatment protocol.  Do think that Prolia is a great option for her.  However we could consider using Evenity on her if she requires an additional anabolic agent.  I also do think that this insufficiency fracture is related to some lingering effects from  bisphosphonates given its 10-year half-life.             Vitamin D levels are pending             Continue to maximize nutrition and weightbearing activities as weightbearing activities will have much greater impact on her overall bone strength   - Activity:             Up with therapies   - FEN/GI prophylaxis/Foley/Lines:             Regular diet             Foley discontinued                         Do not use pure wick             Continue with IV and IV fluids   - Impediments to fracture healing:             Autoimmune disease             History of chronic steroid use             Known osteoporosis             Limited mobility/weightbearing activity   - Dispo:            dc home today  Follow up with ortho in 10-14 days    Jari Pigg, PA-C [phone] (C) 09/25/2019, 11:13 AM  [facility] [place] [address] [phone] Jenetta [phone] (F)   After 6pm on weekdays please call office number to get in touch with on call provider or refer to [place] and look to see who is on call for the Sports Medicine Call Group which is listed under orthopaedics   On Weekends please call office number to get in touch with on call provider or refer to [facility]  and look to see who is on call for the Sports Medicine Call Group which is listed under orthopaedics

## 2019-09-25 NOTE — Discharge Instructions (Signed)
Orthopaedic Trauma Service Discharge Instructions   General Discharge Instructions  Orthopaedic Injuries:  Left proximal tibia fracture treated with open reduction and internal fixation using plate and screws  WEIGHT BEARING STATUS: Nonweightbearing Left leg  RANGE OF MOTION/ACTIVITY: unrestricted range of motion Left knee and ankle  Bone health:  Labs show vitamin d deficiency. Continue to take vitamin d and vitamin c   Wound Care: daily dressing changes starting on 09/27/2019. See below  Discharge Wound Care Instructions  Do NOT apply any ointments, solutions or lotions to pin sites or surgical wounds.  These prevent needed drainage and even though solutions like hydrogen peroxide kill bacteria, they also damage cells lining the pin sites that help fight infection.  Applying lotions or ointments can keep the wounds moist and can cause them to breakdown and open up as well. This can increase the risk for infection. When in doubt call the office.  Surgical incisions should be dressed daily.  If any drainage is noted, use one layer of adaptic, then gauze, Kerlix, and an ace wrap.  Alternatively you can use a Mepilex type dressing instead of gauze and Adaptic.  Continue to use Ace wrap or compression sock to help with swelling control  Once the incision is completely dry and without drainage, it may be left open to air out.  Showering may begin 36-48 hours later.  Cleaning gently with soap and water.  DVT/PE prophylaxis: Lovenox 40 mg subcutaneous injection daily x 21 days  Diet: as you were eating previously.  Can use over the counter stool softeners and bowel preparations, such as Miralax, to help with bowel movements.  Narcotics can be constipating.  Be sure to drink plenty of fluids  PAIN MEDICATION USE AND EXPECTATIONS  You have likely been given narcotic medications to help control your pain.  After a traumatic event that results in an fracture (broken bone) with or without  surgery, it is ok to use narcotic pain medications to help control one's pain.  We understand that everyone responds to pain differently and each individual patient will be evaluated on a regular basis for the continued need for narcotic medications. Ideally, narcotic medication use should last no more than 6-8 weeks (coinciding with fracture healing).   As a patient it is your responsibility as well to monitor narcotic medication use and report the amount and frequency you use these medications when you come to your office visit.   We would also advise that if you are using narcotic medications, you should take a dose prior to therapy to maximize you participation.  IF YOU ARE ON NARCOTIC MEDICATIONS IT IS NOT PERMISSIBLE TO OPERATE A MOTOR VEHICLE (MOTORCYCLE/CAR/TRUCK/MOPED) OR HEAVY MACHINERY DO NOT MIX NARCOTICS WITH OTHER CNS (CENTRAL NERVOUS SYSTEM) DEPRESSANTS SUCH AS ALCOHOL   STOP SMOKING OR USING NICOTINE PRODUCTS!!!!  As discussed nicotine severely impairs your body's ability to heal surgical and traumatic wounds but also impairs bone healing.  Wounds and bone heal by forming microscopic blood vessels (angiogenesis) and nicotine is a vasoconstrictor (essentially, shrinks blood vessels).  Therefore, if vasoconstriction occurs to these microscopic blood vessels they essentially disappear and are unable to deliver necessary nutrients to the healing tissue.  This is one modifiable factor that you can do to dramatically increase your chances of healing your injury.    (This means no smoking, no nicotine gum, patches, etc)  DO NOT USE NONSTEROIDAL ANTI-INFLAMMATORY DRUGS (NSAID'S)  Using products such as Advil (ibuprofen), Aleve (naproxen), Motrin (ibuprofen) for  additional pain control during fracture healing can delay and/or prevent the healing response.  If you would like to take over the counter (OTC) medication, Tylenol (acetaminophen) is ok.  However, some narcotic medications that are given  for pain control contain acetaminophen as well. Therefore, you should not exceed more than 4000 mg of tylenol in a day if you do not have liver disease.  Also note that there are may OTC medicines, such as cold medicines and allergy medicines that my contain tylenol as well.  If you have any questions about medications and/or interactions please ask your doctor/PA or your pharmacist.      ICE AND ELEVATE INJURED/OPERATIVE EXTREMITY  Using ice and elevating the injured extremity above your heart can help with swelling and pain control.  Icing in a pulsatile fashion, such as 20 minutes on and 20 minutes off, can be followed.    Do not place ice directly on skin. Make sure there is a barrier between to skin and the ice pack.    Using frozen items such as frozen peas works well as the conform nicely to the are that needs to be iced.  USE AN ACE WRAP OR TED HOSE FOR SWELLING CONTROL  In addition to icing and elevation, Ace wraps or TED hose are used to help limit and resolve swelling.  It is recommended to use Ace wraps or TED hose until you are informed to stop.    When using Ace Wraps start the wrapping distally (farthest away from the body) and wrap proximally (closer to the body)   Example: If you had surgery on your leg or thing and you do not have a splint on, start the ace wrap at the toes and work your way up to the thigh        If you had surgery on your upper extremity and do not have a splint on, start the ace wrap at your fingers and work your way up to the upper arm  IF YOU ARE IN A SPLINT OR CAST DO NOT [place]   If your splint gets wet for any reason please contact the office immediately. You may shower in your splint or cast as long as you keep it dry.  This can be done by wrapping in a cast cover or garbage back (or similar)  Do Not stick any thing down your splint or cast such as pencils, money, or hangers to try and scratch yourself with.  If you feel itchy take  benadryl as prescribed on the bottle for itching  IF YOU ARE IN A CAM BOOT (BLACK BOOT)  You may remove boot periodically. Perform daily dressing changes as noted below.  Wash the liner of the boot regularly and wear a sock when wearing the boot. It is recommended that you sleep in the boot until told otherwise    Call office for the following:  Temperature greater than 101F  Persistent nausea and vomiting  Severe uncontrolled pain  Redness, tenderness, or signs of infection (pain, swelling, redness, odor or green/yellow discharge around the site)  Difficulty breathing, headache or visual disturbances  Hives  Persistent dizziness or light-headedness  Extreme fatigue  Any other questions or concerns you may have after discharge  In an emergency, call 911 or go to an Emergency Department at a nearby hospital    [place]: [phone]   VISIT OUR WEBSITE FOR ADDITIONAL INFORMATION: [email or web address]

## 2019-09-25 NOTE — Progress Notes (Signed)
Pt given discharge instructions and mepilex for dressing change. Pt and spouse verbalize understanding of instructions. Belongings gathered to be taken home. Pt waiting on [facility] for transport.

## 2019-09-25 NOTE — Plan of Care (Signed)

## 2019-09-25 NOTE — TOC Transition Note (Signed)
Transition of Care [facility]) - CM/SW Discharge Note   Patient Details  Name: Jessica Burke MRN: [id number] Date of Birth: 1960-09-29  Transition of Care [facility]) CM/SW Contact:  Jessica Mayo, RN Phone Number: 09/25/2019, 12:21 PM   Clinical Narrative:    Patient is for dc today, she will need transfer boad, 3 n 1, and hoyer lift,  NCM made referral to [facility] with [employer] for DME, it will be delivered to patient's home, address confirmed. NCM notified [place] with [facility] that patient is for dc today, she is set up with [employer].  Patient will need ptar transport Staff RN states she is ready . NCM will call ptar transport for now.    Final next level of care: [place] Barriers to Discharge: No Barriers Identified   Patient Goals and CMS Choice Patient states their goals for this hospitalization and ongoing recovery are:: return home with spouse and be able to get around better CMS Medicare.gov Compare Post Acute Care list provided to:: Patient Choice offered to / list presented to : Patient, Spouse  Discharge Placement                       Discharge Plan and Services   Discharge Planning Services: CM Consult Post Acute Care Choice: Durable Medical Equipment, Home Health          DME Arranged: 3-N-1(hoyer lift, transfer board) DME Agency: [employer] Date DME Agency Contacted: 09/25/19 Time DME Agency Contacted: 1219 Representative spoke with at DME Agency: zach [place]: PT, OT, Nurse's Aide, Social Work [employer] Agency: [facility] [place] Date [place]: 09/23/19 Time [place]: 1716 Representative spoke with at [place]: [place] ([place]) Interventions     Readmission Risk Interventions No flowsheet data found.

## 2019-09-25 NOTE — Progress Notes (Signed)
Occupational Therapy Treatment Patient Details Name: Jessica Burke MRN: [id number] DOB: 10-06-1960 Today's Date: 09/25/2019    History of present illness Pt is a 59 yo female s/p fall on L knee resulting in L tibia/fibula fx requiring ORIF of bicondylar tibial plateau and aspiration of L knee hemiarthrosis, NWB, no ROM restrictions. PMHx: muscular dystrophy, falls, depression, HTN, PNA.   OT comments  Pt. Seen for skilled OT treatment session.  Focus of session introduction HEP with use of level II theraband.  Pt. Reports having 3lb weights at home she uses for B UE exercises and reports completing them several times a day.  Kermit Balo return demo of BUE exercises.  Pt. Initiated rest breaks appropriately also.    Follow Up Recommendations  Home health OT;Supervision/Assistance - 24 hour    Equipment Recommendations  Other (comment)    Recommendations for Other Services      Precautions / Restrictions Precautions Precautions: Fall Restrictions Weight Bearing Restrictions: Yes LLE Weight Bearing: Non weight bearing       Mobility Bed Mobility                  Transfers                      Balance                                           ADL either performed or assessed with clinical judgement   ADL                                               Vision       Perception     Praxis      Cognition Arousal/Alertness: Awake/alert Behavior During Therapy: WFL for tasks assessed/performed Overall Cognitive Status: Within Functional Limits for tasks assessed                                          Exercises General Exercises - Upper Extremity Shoulder Horizontal ABduction: AROM;Both;5 reps;Theraband Theraband Level (Shoulder Horizontal Abduction): Level 2 (Red) Shoulder Horizontal ADduction: AROM;Both;5 reps;Theraband Theraband Level (Shoulder Horizontal Adduction): Level 2 (Red) Elbow  Flexion: AROM;Both;5 reps;Theraband Theraband Level (Elbow Flexion): Level 2 (Red) Elbow Extension: AROM;Both;5 reps;Theraband Theraband Level (Elbow Extension): Level 2 (Red) Other Exercises Other Exercises: reviewed using bed rails to reach over and pull for exercise and precurser to aide in rolling and repositioning.  pt. reports she uses 3lb weights at home.  educated on lengthening theraband to decrease resistance vs. shorter to increase resistance. Other Exercises: pt. able to initiate rest breaks approriately and did better with sets of 5 reps with rest break in between each side   Shoulder Instructions       General Comments      Pertinent Vitals/ Pain       Pain Assessment: No/denies pain  Home Living                                          Prior Functioning/Environment  Frequency  Min 2X/week        Progress Toward Goals  OT Goals(current goals can now be found in the care plan section)  Progress towards OT goals: Progressing toward goals     Plan      Co-evaluation                 AM-PAC OT "6 Clicks" Daily Activity     Outcome Measure   Help from another person eating meals?: A Little Help from another person taking care of personal grooming?: A Lot Help from another person toileting, which includes using toliet, bedpan, or urinal?: A Lot Help from another person bathing (including washing, rinsing, drying)?: A Lot Help from another person to put on and taking off regular upper body clothing?: A Little Help from another person to put on and taking off regular lower body clothing?: A Lot 6 Click Score: 14    End of Session Equipment Utilized During Treatment: Other (comment)(theraband)  OT Visit Diagnosis: Unsteadiness on feet (R26.81);Muscle weakness (generalized) (M62.81);Pain Pain - Right/Left: Left Pain - part of body: Leg   Activity Tolerance Patient tolerated treatment well   Patient Left in  bed;with call bell/phone within reach   Nurse Communication          Time: 0900-0908 OT Time Calculation (min): 8 min  Charges: OT General Charges $OT Visit: 1 Visit OT Treatments $Therapeutic Exercise: 8-22 mins  Sonia Baller, COTA/L Acute Rehabilitation [phone]   Janice Coffin 09/25/2019, 11:07 AM

## 2019-09-28 DIAGNOSIS — I10 Essential (primary) hypertension: Secondary | ICD-10-CM | POA: Diagnosis not present

## 2019-09-28 DIAGNOSIS — G7100 Muscular dystrophy, unspecified: Secondary | ICD-10-CM | POA: Diagnosis not present

## 2019-09-28 DIAGNOSIS — R69 Illness, unspecified: Secondary | ICD-10-CM | POA: Diagnosis not present

## 2019-09-28 DIAGNOSIS — S82832D Other fracture of upper and lower end of left fibula, subsequent encounter for closed fracture with routine healing: Secondary | ICD-10-CM | POA: Diagnosis not present

## 2019-09-28 DIAGNOSIS — D649 Anemia, unspecified: Secondary | ICD-10-CM | POA: Diagnosis not present

## 2019-09-28 DIAGNOSIS — S82102A Unspecified fracture of upper end of left tibia, initial encounter for closed fracture: Secondary | ICD-10-CM | POA: Diagnosis not present

## 2019-09-28 DIAGNOSIS — M359 Systemic involvement of connective tissue, unspecified: Secondary | ICD-10-CM | POA: Diagnosis not present

## 2019-09-28 DIAGNOSIS — M1990 Unspecified osteoarthritis, unspecified site: Secondary | ICD-10-CM | POA: Diagnosis not present

## 2019-09-28 DIAGNOSIS — Z993 Dependence on wheelchair: Secondary | ICD-10-CM | POA: Diagnosis not present

## 2019-09-28 DIAGNOSIS — Z8701 Personal history of pneumonia (recurrent): Secondary | ICD-10-CM | POA: Diagnosis not present

## 2019-09-28 DIAGNOSIS — S82102D Unspecified fracture of upper end of left tibia, subsequent encounter for closed fracture with routine healing: Secondary | ICD-10-CM | POA: Diagnosis not present

## 2019-09-28 DIAGNOSIS — M3320 Polymyositis, organ involvement unspecified: Secondary | ICD-10-CM | POA: Diagnosis not present

## 2019-09-28 DIAGNOSIS — M810 Age-related osteoporosis without current pathological fracture: Secondary | ICD-10-CM | POA: Diagnosis not present

## 2019-10-01 ENCOUNTER — Other Ambulatory Visit: Payer: Self-pay | Admitting: *Deleted

## 2019-10-01 DIAGNOSIS — S82102D Unspecified fracture of upper end of left tibia, subsequent encounter for closed fracture with routine healing: Secondary | ICD-10-CM | POA: Diagnosis not present

## 2019-10-01 DIAGNOSIS — S82832D Other fracture of upper and lower end of left fibula, subsequent encounter for closed fracture with routine healing: Secondary | ICD-10-CM | POA: Diagnosis not present

## 2019-10-01 DIAGNOSIS — Z993 Dependence on wheelchair: Secondary | ICD-10-CM | POA: Diagnosis not present

## 2019-10-01 DIAGNOSIS — I10 Essential (primary) hypertension: Secondary | ICD-10-CM | POA: Diagnosis not present

## 2019-10-01 DIAGNOSIS — R69 Illness, unspecified: Secondary | ICD-10-CM | POA: Diagnosis not present

## 2019-10-01 DIAGNOSIS — Z8701 Personal history of pneumonia (recurrent): Secondary | ICD-10-CM | POA: Diagnosis not present

## 2019-10-01 DIAGNOSIS — D649 Anemia, unspecified: Secondary | ICD-10-CM | POA: Diagnosis not present

## 2019-10-01 DIAGNOSIS — M1990 Unspecified osteoarthritis, unspecified site: Secondary | ICD-10-CM | POA: Diagnosis not present

## 2019-10-01 DIAGNOSIS — M810 Age-related osteoporosis without current pathological fracture: Secondary | ICD-10-CM | POA: Diagnosis not present

## 2019-10-01 DIAGNOSIS — G7100 Muscular dystrophy, unspecified: Secondary | ICD-10-CM | POA: Diagnosis not present

## 2019-10-01 NOTE — Patient Outreach (Signed)
Red EMMI general discharge received for 09/30/19, Scheduled follow up- No.  Outreach call to pt to address, spoke with pt who reports conversation will need to be brief as she is working with home health PT, Pt reports she plans to call this week and make her post hospital follow up appointment and is able to do so without RN CM assistance.  No new concerns voiced.  Jacqlyn Larsen [facility], [place] Coordinator [phone]

## 2019-10-07 DIAGNOSIS — I10 Essential (primary) hypertension: Secondary | ICD-10-CM | POA: Diagnosis not present

## 2019-10-07 DIAGNOSIS — R69 Illness, unspecified: Secondary | ICD-10-CM | POA: Diagnosis not present

## 2019-10-07 DIAGNOSIS — Z993 Dependence on wheelchair: Secondary | ICD-10-CM | POA: Diagnosis not present

## 2019-10-07 DIAGNOSIS — M1990 Unspecified osteoarthritis, unspecified site: Secondary | ICD-10-CM | POA: Diagnosis not present

## 2019-10-07 DIAGNOSIS — S82832D Other fracture of upper and lower end of left fibula, subsequent encounter for closed fracture with routine healing: Secondary | ICD-10-CM | POA: Diagnosis not present

## 2019-10-07 DIAGNOSIS — G7100 Muscular dystrophy, unspecified: Secondary | ICD-10-CM | POA: Diagnosis not present

## 2019-10-07 DIAGNOSIS — M810 Age-related osteoporosis without current pathological fracture: Secondary | ICD-10-CM | POA: Diagnosis not present

## 2019-10-07 DIAGNOSIS — Z8701 Personal history of pneumonia (recurrent): Secondary | ICD-10-CM | POA: Diagnosis not present

## 2019-10-07 DIAGNOSIS — D649 Anemia, unspecified: Secondary | ICD-10-CM | POA: Diagnosis not present

## 2019-10-07 DIAGNOSIS — S82102D Unspecified fracture of upper end of left tibia, subsequent encounter for closed fracture with routine healing: Secondary | ICD-10-CM | POA: Diagnosis not present

## 2019-10-21 DIAGNOSIS — S82142D Displaced bicondylar fracture of left tibia, subsequent encounter for closed fracture with routine healing: Secondary | ICD-10-CM | POA: Diagnosis not present

## 2019-10-22 DIAGNOSIS — S82102D Unspecified fracture of upper end of left tibia, subsequent encounter for closed fracture with routine healing: Secondary | ICD-10-CM | POA: Diagnosis not present

## 2019-10-22 DIAGNOSIS — Z993 Dependence on wheelchair: Secondary | ICD-10-CM | POA: Diagnosis not present

## 2019-10-22 DIAGNOSIS — D649 Anemia, unspecified: Secondary | ICD-10-CM | POA: Diagnosis not present

## 2019-10-22 DIAGNOSIS — R69 Illness, unspecified: Secondary | ICD-10-CM | POA: Diagnosis not present

## 2019-10-22 DIAGNOSIS — I10 Essential (primary) hypertension: Secondary | ICD-10-CM | POA: Diagnosis not present

## 2019-10-22 DIAGNOSIS — S82832D Other fracture of upper and lower end of left fibula, subsequent encounter for closed fracture with routine healing: Secondary | ICD-10-CM | POA: Diagnosis not present

## 2019-10-22 DIAGNOSIS — G7100 Muscular dystrophy, unspecified: Secondary | ICD-10-CM | POA: Diagnosis not present

## 2019-10-22 DIAGNOSIS — M810 Age-related osteoporosis without current pathological fracture: Secondary | ICD-10-CM | POA: Diagnosis not present

## 2019-10-22 DIAGNOSIS — Z8701 Personal history of pneumonia (recurrent): Secondary | ICD-10-CM | POA: Diagnosis not present

## 2019-10-22 DIAGNOSIS — M1990 Unspecified osteoarthritis, unspecified site: Secondary | ICD-10-CM | POA: Diagnosis not present

## 2019-10-26 DIAGNOSIS — G7100 Muscular dystrophy, unspecified: Secondary | ICD-10-CM | POA: Diagnosis not present

## 2019-10-26 DIAGNOSIS — S82102A Unspecified fracture of upper end of left tibia, initial encounter for closed fracture: Secondary | ICD-10-CM | POA: Diagnosis not present

## 2019-10-26 DIAGNOSIS — M359 Systemic involvement of connective tissue, unspecified: Secondary | ICD-10-CM | POA: Diagnosis not present

## 2019-10-26 DIAGNOSIS — M3320 Polymyositis, organ involvement unspecified: Secondary | ICD-10-CM | POA: Diagnosis not present

## 2019-11-18 DIAGNOSIS — S82142D Displaced bicondylar fracture of left tibia, subsequent encounter for closed fracture with routine healing: Secondary | ICD-10-CM | POA: Diagnosis not present

## 2019-11-26 DIAGNOSIS — M359 Systemic involvement of connective tissue, unspecified: Secondary | ICD-10-CM | POA: Diagnosis not present

## 2019-11-26 DIAGNOSIS — M3320 Polymyositis, organ involvement unspecified: Secondary | ICD-10-CM | POA: Diagnosis not present

## 2019-11-26 DIAGNOSIS — G7100 Muscular dystrophy, unspecified: Secondary | ICD-10-CM | POA: Diagnosis not present

## 2019-11-26 DIAGNOSIS — S82102A Unspecified fracture of upper end of left tibia, initial encounter for closed fracture: Secondary | ICD-10-CM | POA: Diagnosis not present

## 2019-12-04 ENCOUNTER — Other Ambulatory Visit: Payer: Self-pay | Admitting: Internal Medicine

## 2019-12-04 DIAGNOSIS — Z1231 Encounter for screening mammogram for malignant neoplasm of breast: Secondary | ICD-10-CM

## 2019-12-07 ENCOUNTER — Other Ambulatory Visit: Payer: Self-pay

## 2019-12-07 ENCOUNTER — Ambulatory Visit: Payer: Medicare HMO | Attending: Orthopedic Surgery | Admitting: Physical Therapy

## 2019-12-07 ENCOUNTER — Encounter: Payer: Self-pay | Admitting: Physical Therapy

## 2019-12-07 DIAGNOSIS — M6281 Muscle weakness (generalized): Secondary | ICD-10-CM | POA: Insufficient documentation

## 2019-12-07 DIAGNOSIS — R2689 Other abnormalities of gait and mobility: Secondary | ICD-10-CM | POA: Insufficient documentation

## 2019-12-07 NOTE — Therapy (Signed)
[place], [place], [address] Phone: [phone]   Fax:  [phone]  Physical Therapy Evaluation  Patient Details  Name: Jessica Burke MRN: [id number] Date of Birth: 1960-12-21 Referring Provider (PT): Ainsley Spinner [facility]    Encounter Date: 12/07/2019  PT End of Session - 12/07/19 1444    Visit Number  1    Number of Visits  12    Date for PT Re-Evaluation  01/18/20    Authorization Type  [employer] Medicare    PT Start Time  1324    PT Stop Time  1413    PT Time Calculation (min)  49 min    Activity Tolerance  Patient tolerated treatment well    Behavior During Therapy  [facility] for tasks assessed/performed       Past Medical History:  Diagnosis Date  . Anemia   . Arthritis   . Chest pain 12/2016  . Depression    2003  . H/O corticosteroid therapy 09/23/2019  . Hypertension   . 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[place])   . Osteoporosis   . Pneumonia    2016  . Polymyositis ([place])   . Polymyositis ([place]) 12/2016  . Shortness of breath dyspnea   . Vitamin D deficiency 09/24/2019    Past Surgical History:  Procedure Laterality Date  . CESAREAN SECTION    . ENDOMETRIAL ABLATION    . LEEP    . ORIF TIBIA FRACTURE Left 09/22/2019   Procedure: OPEN REDUCTION INTERNAL FIXATION (ORIF) PROXIMAL TIBIA FRACTURE;  Surgeon: Altamese Riverview, MD;  Location: [place];  Service: Orthopedics;  Laterality: Left;    There were no vitals filed for this visit.   Subjective Assessment - 12/07/19 1333    Subjective  Patient had a fall on 09/20/2019 and suffered a left knee tibial plateau fx. 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 Her husband helped her transfer to standing prior to the fall then she was able to walk using her cane. She has intemittent pain in her hips 2nd to the polymyositis. She has had no pain in her knee.    Pertinent History  polymyositis; osteoperosis, osteoperosis     Limitations  Standing;Walking    How long can you stand comfortably?  can not stand    How long can you walk comfortably?  can not stand    Diagnostic tests  X-ray: well healed per patient    Currently in Pain?  No/denies   intermittent pain at times        [facility] PT Assessment - 12/07/19 0001      Assessment   Medical Diagnosis  Left Proximal tibia fx     Referring Provider (PT)  Ainsley Spinner [facility]     Onset Date/Surgical Date  --   09/20/2019   Hand Dominance  Right    Next MD Visit  May 26th     Prior Therapy  None       Precautions   Precautions  None      Restrictions   Weight Bearing Restrictions  No      Balance Screen   Has the patient fallen in the past 6 months  Yes    How many times?  1    Has the patient had a decrease in activity level because of a fear of falling?   No    Is the patient reluctant to leave their home because of a fear of  falling?   No      Home Environment   Living Environment  Private residence    Available Help at Discharge  Family    Additional Comments  Husband helps patient in and out of the house       Prior Function   Level of Independence  Needs assistance with ADLs;Needs assistance with gait    Comments  walking around outside       Cognition   Overall Cognitive Status  Within Functional Limits for tasks assessed    Attention  Focused    Focused Attention  Appears intact    Memory  Appears intact    Awareness  Appears intact    Problem Solving  Appears intact      Observation/Other Assessments   Observations  has hoyer pad underneath her     Focus on Therapeutic Outcomes (FOTO)   not given       Sensation   Light Touch  Appears Intact      Coordination   Gross Motor Movements are Fluid and Coordinated  Yes    Fine Motor Movements are Fluid and Coordinated  Yes      ROM / Strength   AROM / PROM / Strength  AROM;PROM;Strength      AROM   Overall AROM Comments  full active extension of the knee; no active hip flexion or  adduction bilateral, per patient this is baseline       PROM   Overall PROM Comments  100 degrees of flexion limited somehat by wheelchair; full passive knee extension       Strength   Strength Assessment Site  Hip;Knee    Right/Left Hip  Right;Left    Right Hip Flexion  2/5    Right Hip ABduction  4/5    Right Hip ADduction  2/5    Left Hip Flexion  2/5    Left Hip ABduction  4/5    Right/Left Knee  Right;Left    Right Knee Flexion  4+/5    Right Knee Extension  4+/5    Left Knee Flexion  4/5    Left Knee Extension  4/5      Transfers   Comments  total assist from therapist to transfer to standing. Patient unabe to achieve standing. With husband husnabd able to stand her up and held her for about 30 seconds but it did not appear the patient was assisting.                 Objective measurements completed on examination: See above findings.      [place] Adult PT Treatment/Exercise - 12/07/19 0001      Knee/Hip Exercises: Seated   Other Seated Knee/Hip Exercises  laq x10 bilateral ; hamstring curl x10 yellow bilateral; hip abduction x15 yellow                PT Short Term Goals - 12/07/19 1449      PT SHORT TERM GOAL #1   Title  Patient will transfer sit to stand with mod a and a walker    Time  3    Period  Weeks    Status  New    Target Date  12/28/19      PT SHORT TERM GOAL #2   Title  Patient will be independent with basic HEP    Time  3    Period  Weeks    Status  New    Target Date  12/28/19      PT SHORT TERM GOAL #3   Title  Patient will stand for mod a for 45 seconds    Time  3    Period  Weeks    Status  New    Target Date  12/28/19        PT Long Term Goals - 12/07/19 1450      PT LONG TERM GOAL #1   Title  Patient will ambualte 38' with a RW and min a for balance    Time  6    Period  Weeks    Status  New    Target Date  01/18/20      PT LONG TERM GOAL #2   Title  Patient will transfer sit to stand 2ith min a of her  husband    Time  6    Period  Weeks    Status  New    Target Date  01/18/20      PT LONG TERM GOAL #3   Title  Patient will transfer from chair to chair with min a in order to transfer to the commode better    Time  6    Period  Weeks    Status  New    Target Date  01/18/20             Plan - 12/07/19 1454    Clinical Impression Statement  Patient is a 59 year old female S/P left Proximal tibia fx and ORIF. She presents with polymyocitis which effected her base line mobility. At Baseline her husband helped her to stand. Once standing she could ambualte with a cane in her house. At this time she is Max a to transfer. sit to stand. She maintained standing with what looked like total assist from her husband. Therapist could not get her to afull standing position from the wheelchair. She has good range in her left knee. At baseline she has limited hip flexion strength and very limited hip adduction strength. She reports this is a symptom of the polymyocititis.    Personal Factors and Comorbidities  Comorbidity 1;Comorbidity 2;Comorbidity 3+    Comorbidities  polycititis, depression, osteoperosis    Examination-Activity Limitations  Bathing;Hygiene/Grooming;Squat;Lift;Locomotion Level;Bend;Caring for Others;Dressing;Transfers;Stand    Examination-Participation Restrictions  Shop;Laundry;Yard Work;Meal Prep;Personal Finances    Stability/Clinical Decision Making  Unstable/Unpredictable   Knee effected by unpredicatble symptoms with polycititis   Clinical Decision Making  High    Rehab Potential  Good   for predicted goals   PT Frequency  2x / week   likely will only be able to come 1x a week   PT Duration  6 weeks    PT Treatment/Interventions  ADLs/Self Care Home Management;Cryotherapy;Electrical Stimulation;Iontophoresis 4mg /ml Dexamethasone;Ultrasound;DME Instruction;Gait training;Stair training;Functional mobility training;Therapeutic activities;Therapeutic exercise;Neuromuscular  re-education;Patient/family education;Manual techniques;Passive range of motion;Taping;Splinting;Balance training    PT Next Visit Plan  Begin transfer training, unclear how much patient will be able to tolerate. Consdier seated weight shifting; patient is in a don joy brace in full extension; script says it can be d'c'd in 2 weeks; consider UE strengthening    PT Home Exercise Plan  laq; resisted hamstring curl with band; hip abdcution yellow band    Consulted and Agree with Plan of Care  Patient       Patient will benefit from skilled therapeutic intervention in order to improve the following deficits and impairments:  Decreased endurance, Decreased activity tolerance, Abnormal gait, Difficulty walking, Decreased range of motion, Decreased  knowledge of use of DME, Decreased strength, Pain, Impaired tone  Visit Diagnosis: Other abnormalities of gait and mobility  Muscle weakness (generalized)     Problem List Patient Active Problem List   Diagnosis Date Noted  . Vitamin D deficiency 09/24/2019  . H/O corticosteroid therapy 09/23/2019  . Osteoporosis   . 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[place])   . Closed fracture of left proximal tibia 09/21/2019  . CAP (community acquired pneumonia)   . Polymyositis ([place]) 08/03/2015  . Acute respiratory failure ([place]) 11/21/2014  . Acute hypoxemic respiratory failure ([place]) 11/20/2014  . Influenza A (H1N1) 11/20/2014  . Autoimmune disease ([place]) 11/20/2014  . Chest pain 03/17/2013  . Leukocytosis 03/17/2013  . Anemia 03/17/2013  . Hypokalemia 03/17/2013  . HTN (hypertension) 03/17/2013    Carney Living PT DPT  12/07/2019, 4:13 PM  [facility] [address] [place], [place], [address] Phone: [phone]   Fax:  [phone]  Name: Jessica Burke MRN: [id number] Date of Birth: 1960/09/30

## 2019-12-17 ENCOUNTER — Encounter: Payer: Self-pay | Admitting: Physical Therapy

## 2019-12-17 ENCOUNTER — Ambulatory Visit: Payer: Medicare HMO | Admitting: Physical Therapy

## 2019-12-17 ENCOUNTER — Other Ambulatory Visit: Payer: Self-pay

## 2019-12-17 DIAGNOSIS — M6281 Muscle weakness (generalized): Secondary | ICD-10-CM | POA: Diagnosis not present

## 2019-12-17 DIAGNOSIS — R2689 Other abnormalities of gait and mobility: Secondary | ICD-10-CM | POA: Diagnosis not present

## 2019-12-17 NOTE — Therapy (Signed)
[place], [place], [address] Phone: [phone]   Fax:  [phone]  Physical Therapy Treatment  Patient Details  Name: Jessica Burke MRN: [id number] Date of Birth: 1961-02-22 Referring Provider (PT): Ainsley Spinner [facility]    Encounter Date: 12/17/2019  PT End of Session - 12/17/19 1559    Visit Number  2    Number of Visits  12    Date for PT Re-Evaluation  01/18/20    Authorization Type  Aetna Medicare    PT Start Time  1450    PT Stop Time  1545    PT Time Calculation (min)  55 min    Equipment Utilized During Treatment  Gait belt;Other (comment)   w/c   Activity Tolerance  Patient tolerated treatment well       Past Medical History:  Diagnosis Date  . Anemia   . Arthritis   . Chest pain 12/2016  . Depression    2003  . H/O corticosteroid therapy 09/23/2019  . Hypertension   . 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[place])   . Osteoporosis   . Pneumonia    2016  . Polymyositis ([place])   . Polymyositis ([place]) 12/2016  . Shortness of breath dyspnea   . Vitamin D deficiency 09/24/2019    Past Surgical History:  Procedure Laterality Date  . CESAREAN SECTION    . ENDOMETRIAL ABLATION    . LEEP    . ORIF TIBIA FRACTURE Left 09/22/2019   Procedure: OPEN REDUCTION INTERNAL FIXATION (ORIF) PROXIMAL TIBIA FRACTURE;  Surgeon: Altamese Spooner, MD;  Location: [place];  Service: Orthopedics;  Laterality: Left;    There were no vitals filed for this visit.  Subjective Assessment - 12/17/19 1553    Subjective  No new complaints/concerns since initial eval visit. No pain pre-tx. though notes recent soreness in leg.    Pertinent History  polymyositis; osteoperosis, osteoperosis    Currently in Pain?  No/denies         [facility] PT Assessment - 12/17/19 0001      Transfers   Transfers  Sit to Stand;Squat Pivot Transfers   see comments   Comments  squat pivot transfer from w/c<>mat with total assist, sit<>stand x 3 in  parallel bars with attempted standing up to 10 sec holds x 3, total assist for sit<>stand from w/c, also performed sit<>supine bed mobility on mat with mod assist to lift LE for sit>supine and max assist supine>sit                   [facility] Adult PT Treatment/Exercise - 12/17/19 0001      Exercises   Exercises  Knee/Hip;Ankle      Knee/Hip Exercises: Seated   Long Arc Quad  AROM;Strengthening;Both;15 reps    Long Arc Quad Weight  2 lbs.    Clamshell with TheraBand  Red   15 reps   Other Seated Knee/Hip Exercises  instructed HEP leg press with Theraband    Hamstring Curl  AROM;Strengthening;Both;15 reps    Hamstring Limitations  red Theraband      Knee/Hip Exercises: Supine   Short Arc Quad Sets  AROM;Strengthening;Both;2 sets;10 reps    Short Arc Quad Sets Limitations  2 lbs.    Bridges Limitations  partial bridge x 15 reps with legs over reversed incline wedge    Other Supine Knee/Hip Exercises  left hip abduction AROM 2x10 with therapist assist for PROM in adduction on return  Ankle Exercises: Stretches   Gastroc Stretch  3 reps;30 seconds    Gastroc Stretch Limitations  left side supine manual stretch      Ankle Exercises: Supine   T-Band  Theraband ankle 4-way left side 2x10 ea. with red band             PT Education - 12/17/19 1559    Education Details  HEP updates, POC, exercises, transfers    Person(s) Educated  Patient;Spouse    Methods  Explanation;Demonstration;Verbal cues;Handout    Comprehension  Verbalized understanding;Returned demonstration       PT Short Term Goals - 12/07/19 1449      PT SHORT TERM GOAL #1   Title  Patient will transfer sit to stand with mod a and a walker    Time  3    Period  Weeks    Status  New    Target Date  12/28/19      PT SHORT TERM GOAL #2   Title  Patient will be independent with basic HEP    Time  3    Period  Weeks    Status  New    Target Date  12/28/19      PT SHORT TERM GOAL #3   Title   Patient will stand for mod a for 45 seconds    Time  3    Period  Weeks    Status  New    Target Date  12/28/19        PT Long Term Goals - 12/07/19 1450      PT LONG TERM GOAL #1   Title  Patient will ambualte 50' with a RW and min a for balance    Time  6    Period  Weeks    Status  New    Target Date  01/18/20      PT LONG TERM GOAL #2   Title  Patient will transfer sit to stand 2ith min a of her husband    Time  6    Period  Weeks    Status  New    Target Date  01/18/20      PT LONG TERM GOAL #3   Title  Patient will transfer from chair to chair with min a in order to transfer to the commode better    Time  6    Period  Weeks    Status  New    Target Date  01/18/20            Plan - 12/17/19 1601    Clinical Impression Statement  Tx. focus supine and seated LE strengthening with open chain exercises given limited ability to stand. Performed transfers from w/c to mat and also practiced sit<>stand in parallel bars with total assist still required. Able to stand 10 seconds with therapist assist (with w/c behind pt. for safety) but unable to extend knees to stand fully upright. Session well-tolerated but given level of muscle weakness and underlying polymyositis expect progress to improve functional status with transfers and gait will be gradual.    Personal Factors and Comorbidities  Comorbidity 1;Comorbidity 2;Comorbidity 3+    Comorbidities  polycititis, depression, osteoperosis    Examination-Activity Limitations  Bathing;Hygiene/Grooming;Squat;Lift;Locomotion Level;Bend;Caring for Others;Dressing;Transfers;Stand    Examination-Participation Restrictions  Shop;Laundry;Yard Work;Meal Prep;Personal Finances    Stability/Clinical Decision Making  Unstable/Unpredictable    Clinical Decision Making  High    Rehab Potential  Good    PT Frequency  2x /  week    PT Duration  6 weeks    PT Treatment/Interventions  ADLs/Self Care Home Management;Cryotherapy;Electrical  Stimulation;Iontophoresis 4mg /ml Dexamethasone;Ultrasound;DME Instruction;Gait training;Stair training;Functional mobility training;Therapeutic activities;Therapeutic exercise;Neuromuscular re-education;Patient/family education;Manual techniques;Passive range of motion;Taping;Splinting;Balance training    PT Next Visit Plan  Requires total assist for transfers-continue practice on transfers including work with spouse on form prn, continue try standing in parallel bars, continue mat-based vs. seated open chain leg strengthening as tolerated    PT Home Exercise Plan  laq; resisted hamstring curl with band; hip abduction yellow band, Theraband ankle 4-way and Theraband leg press    Consulted and Agree with Plan of Care  Patient       Patient will benefit from skilled therapeutic intervention in order to improve the following deficits and impairments:  Decreased endurance, Decreased activity tolerance, Abnormal gait, Difficulty walking, Decreased range of motion, Decreased knowledge of use of DME, Decreased strength, Pain, Impaired tone  Visit Diagnosis: Other abnormalities of gait and mobility  Muscle weakness (generalized)     Problem List Patient Active Problem List   Diagnosis Date Noted  . Vitamin D deficiency 09/24/2019  . H/O corticosteroid therapy 09/23/2019  . Osteoporosis   . 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[place])   . Closed fracture of left proximal tibia 09/21/2019  . CAP (community acquired pneumonia)   . Polymyositis ([place]) 08/03/2015  . Acute respiratory failure ([place]) 11/21/2014  . Acute hypoxemic respiratory failure ([place]) 11/20/2014  . Influenza A (H1N1) 11/20/2014  . Autoimmune disease ([place]) 11/20/2014  . Chest pain 03/17/2013  . Leukocytosis 03/17/2013  . Anemia 03/17/2013  . Hypokalemia 03/17/2013  . HTN (hypertension) 03/17/2013    Beaulah Dinning, PT, DPT 12/17/19 4:07 PM  [facility] [address] [place],  [place], [address] Phone: [phone]   Fax:  [phone]  Name: Jessica Burke MRN: [id number] Date of Birth: 21-Nov-1960

## 2019-12-26 DIAGNOSIS — G7100 Muscular dystrophy, unspecified: Secondary | ICD-10-CM | POA: Diagnosis not present

## 2019-12-26 DIAGNOSIS — S82102A Unspecified fracture of upper end of left tibia, initial encounter for closed fracture: Secondary | ICD-10-CM | POA: Diagnosis not present

## 2019-12-26 DIAGNOSIS — M359 Systemic involvement of connective tissue, unspecified: Secondary | ICD-10-CM | POA: Diagnosis not present

## 2019-12-26 DIAGNOSIS — M3320 Polymyositis, organ involvement unspecified: Secondary | ICD-10-CM | POA: Diagnosis not present

## 2019-12-30 DIAGNOSIS — S82142D Displaced bicondylar fracture of left tibia, subsequent encounter for closed fracture with routine healing: Secondary | ICD-10-CM | POA: Diagnosis not present

## 2019-12-30 DIAGNOSIS — M25062 Hemarthrosis, left knee: Secondary | ICD-10-CM | POA: Diagnosis not present

## 2020-01-04 ENCOUNTER — Ambulatory Visit: Payer: Medicare HMO | Attending: Orthopedic Surgery | Admitting: Physical Therapy

## 2020-01-04 ENCOUNTER — Other Ambulatory Visit: Payer: Self-pay

## 2020-01-04 ENCOUNTER — Encounter: Payer: Self-pay | Admitting: Physical Therapy

## 2020-01-04 VITALS — BP 160/100 | HR 105

## 2020-01-04 DIAGNOSIS — M6281 Muscle weakness (generalized): Secondary | ICD-10-CM | POA: Diagnosis not present

## 2020-01-04 DIAGNOSIS — R2689 Other abnormalities of gait and mobility: Secondary | ICD-10-CM | POA: Diagnosis not present

## 2020-01-04 NOTE — Therapy (Signed)
[place], [place], [address] Phone: [phone]   Fax:  [phone]  Physical Therapy Treatment  Patient Details  Name: Jessica Burke MRN: [id number] Date of Birth: [birth year] Referring Provider (PT): Ainsley Spinner [facility]    Encounter Date: 01/04/2020  PT End of Session - 01/04/20 1529    Visit Number  3    Number of Visits  12    Date for PT Re-Evaluation  01/18/20    Authorization Type  [employer]    PT Start Time  1430    PT Stop Time  1523   48 min direct tx. time   PT Time Calculation (min)  53 min    Equipment Utilized During Treatment  Gait belt    Activity Tolerance  Patient limited by fatigue    Behavior During Therapy  [facility] for tasks assessed/performed       Past Medical History:  Diagnosis Date  . Anemia   . Arthritis   . Chest pain 12/2016  . Depression    2003  . H/O corticosteroid therapy 09/23/2019  . Hypertension   . 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[place])   . Osteoporosis   . Pneumonia    2016  . Polymyositis ([place])   . Polymyositis ([place]) 12/2016  . Shortness of breath dyspnea   . Vitamin D deficiency 09/24/2019    Past Surgical History:  Procedure Laterality Date  . CESAREAN SECTION    . ENDOMETRIAL ABLATION    . LEEP    . ORIF TIBIA FRACTURE Left 09/22/2019   Procedure: OPEN REDUCTION INTERNAL FIXATION (ORIF) PROXIMAL TIBIA FRACTURE;  Surgeon: Altamese Mount Ivy, MD;  Location: [place];  Service: Orthopedics;  Laterality: Left;    Vitals:   01/04/20 1457  BP: (!) 160/100  Pulse: (!) 105  SpO2: 95%    Subjective Assessment - 01/04/20 1454    Subjective  Pt. present for session with her spouse who reports she has been trying to stand with his assistance from edge of bed and intermittently has been able to "take a couple of steps." No pain pre-tx.    Currently in Pain?  No/denies                       [facility] Adult PT Treatment/Exercise - 01/04/20 0001       Transfers   Transfers  Sit to [facility];Sit to Supine    Comments  sit<>stand x 3 from w/c in parallel bars total assist with stand up to 10 sec, sit<>stand from edge of high low table x 3 with max assist with standing  to tolerance max assist ea. rep up to 10 sec, squat-pivot from w/c<>mat total assist x 1, sit<>supine max assist x 1      Knee/Hip Exercises: Supine   Short Arc Quad Sets  AROM;Strengthening;Both;2 sets;10 reps    Short Arc Quad Sets Limitations  3 lbs.    Bridges Limitations  partial bridge x 15 reps with legs over reversed incline wedge    Other Supine Knee/Hip Exercises  clamshell x 15 reps red band with assist to hold feet in position of knee flexion, band tension used for AAROM in hpi adduction on return      Ankle Exercises: Supine   T-Band  Theraband ankle 4-way x 10 ea. with red band for HEP review             PT Education - 01/04/20 1528  Education Details  HEP, follow up with MD if BP issues persist    Person(s) Educated  Patient;Spouse    Methods  Explanation;Demonstration;Verbal cues    Comprehension  Verbalized understanding;Returned demonstration       PT Short Term Goals - 12/07/19 1449      PT SHORT TERM GOAL #1   Title  Patient will transfer sit to stand with mod a and a walker    Time  3    Period  Weeks    Status  New    [place] Date  12/28/19      PT SHORT TERM GOAL #2   Title  Patient will be independent with basic HEP    Time  3    Period  Weeks    Status  New    Target Date  12/28/19      PT SHORT TERM GOAL #3   Title  Patient will stand for mod a for 45 seconds    Time  3    Period  Weeks    Status  New    Target Date  12/28/19        PT Long Term Goals - 12/07/19 1450      PT LONG TERM GOAL #1   Title  Patient will ambualte 43' with a RW and min a for balance    Time  6    Period  Weeks    Status  New    Target Date  01/18/20      PT LONG TERM GOAL #2   Title  Patient will transfer sit to  stand 2ith min a of her husband    Time  6    Period  Weeks    Status  New    Target Date  01/18/20      PT LONG TERM GOAL #3   Title  Patient will transfer from chair to chair with min a in order to transfer to the commode better    Time  6    Period  Weeks    Status  New    Target Date  01/18/20            Plan - 01/04/20 1530    Clinical Impression Statement  Pt. continues to be total assist for sit<>stand from w/c (see flowsheet) as well as for squat pivot transfers from w/c<>mat.  She was max assist for sit<>stand from elevated high low table. Pt. reported some nausea after 3rd rep of sit<>stand from high low table so assisted pt. to supine position and assessed vitals as noted. BP was elevated and pt. reported had stopped taking BP medication due to issues with low blood pressure-pt. was directed to continue to monitor BP at home and address BP issues and medication concerns with MD. Given underlying medical issues with polymyositis and level of weakness expect improvement in functional status will take time with status also impacted by limited attendance ability.    Personal Factors and Comorbidities  Comorbidity 1;Comorbidity 2;Comorbidity 3+    Comorbidities  polycititis, depression, osteoperosis    Examination-Activity Limitations  Bathing;Hygiene/Grooming;Squat;Lift;Locomotion Level;Bend;Caring for Others;Dressing;Transfers;Stand    Examination-Participation Restrictions  Shop;Laundry;Yard Work;Meal Prep;Personal Finances    Stability/Clinical Decision Making  Unstable/Unpredictable    Clinical Decision Making  High    Rehab Potential  Good    PT Frequency  2x / week    PT Duration  6 weeks    PT Treatment/Interventions  ADLs/Self Care Home Management;Cryotherapy;Electrical Stimulation;Iontophoresis 4mg /ml Dexamethasone;Ultrasound;DME  Instruction;Gait training;Stair training;Functional mobility training;Therapeutic activities;Therapeutic exercise;Neuromuscular  re-education;Patient/family education;Manual techniques;Passive range of motion;Taping;Splinting;Balance training    PT Next Visit Plan  monitor BP/vitals as needed, Requires total assist for transfers-continue practice on transfers including work with spouse on form prn, continue try standing in parallel bars, continue mat-based vs. seated open chain leg strengthening as tolerated    PT Home Exercise Plan  laq; resisted hamstring curl with band; hip abduction yellow band, Theraband ankle 4-way and Theraband leg press    Consulted and Agree with Plan of Care  Patient       Patient will benefit from skilled therapeutic intervention in order to improve the following deficits and impairments:  Decreased endurance, Decreased activity tolerance, Abnormal gait, Difficulty walking, Decreased range of motion, Decreased knowledge of use of DME, Decreased strength, Pain, Impaired tone  Visit Diagnosis: Other abnormalities of gait and mobility  Muscle weakness (generalized)     Problem List Patient Active Problem List   Diagnosis Date Noted  . Vitamin D deficiency 09/24/2019  . H/O corticosteroid therapy 09/23/2019  . Osteoporosis   . 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[place])   . Closed fracture of left proximal tibia 09/21/2019  . CAP (community acquired pneumonia)   . Polymyositis ([place]) 08/03/2015  . Acute respiratory failure ([place]) 11/21/2014  . Acute hypoxemic respiratory failure ([place]) 11/20/2014  . Influenza A (H1N1) 11/20/2014  . Autoimmune disease ([place]) 11/20/2014  . Chest pain 03/17/2013  . Leukocytosis 03/17/2013  . Anemia 03/17/2013  . Hypokalemia 03/17/2013  . HTN (hypertension) 03/17/2013    Beaulah Dinning, PT, DPT 01/04/20 3:37 PM  [facility] [address] [place], [place], [address] Phone: [phone]   Fax:  [phone]  Name: Jessica Burke MRN: [id number] Date of Birth: [birth year]

## 2020-01-18 ENCOUNTER — Ambulatory Visit: Payer: Medicare HMO | Admitting: Physical Therapy

## 2020-01-21 ENCOUNTER — Other Ambulatory Visit: Payer: Self-pay | Admitting: *Deleted

## 2020-01-21 ENCOUNTER — Encounter: Payer: Self-pay | Admitting: *Deleted

## 2020-01-21 NOTE — Patient Outreach (Signed)
[facility]) Care Management [facility] CM Telephone Outreach, follow up referral from patient call to nurse advice line  01/21/2020  CHARNEICE WEEDON [birth year] [id number]  Successful telephone outreach to Edmonia Lynch, 59 y/o female referred to [place] yesterday after patient contacted the [facility] CM 24-hour nurse advice line to inquire about obtaining financial assistance in getting Ensure feeding supplement.  Patient has history including, but not limited to, anemia; arthritis; osteoporosis; and HTN.  HIPAA/ identity verified and purpose of call discussed with patient who reports her only need is to see if she can get assistance in affording the Ensure supplement; she reports going to outpatient rehabilitation/ PT using established resource through [employer] transportation.  Discussed with patient resources that might be available for her to obtain coupons for Ensure, and provided this information to her; patient reports she uses the internet and both the phone number and website information was provided.  Patient appreciative; denies further needs and reports she is doing overall well.  Discussed with patient that I would also reach out to my contact at [employer] to determine whether or not they are continuing to provide Ensure coupons to PCP offices on behalf of individual patients; if so, I will ask that coupons be mailed to patient's PCP office for patient to pick up.  Patient denies further issues, concerns, or problems today. She declines further care coordination needs at present and states she was only interested in obtaining information around possibility of Ensure coupons.  E-mailed [facility] CM contact at [employer] and will follow up accordingly once a response is obtained- if coupons available for mailing to PCP office, I will facilitate on patient's behalf  Oneta Rack, [place], BSN, [place] Coordinator [facility] Care Management  [phone]

## 2020-01-26 DIAGNOSIS — G7100 Muscular dystrophy, unspecified: Secondary | ICD-10-CM | POA: Diagnosis not present

## 2020-01-26 DIAGNOSIS — M3320 Polymyositis, organ involvement unspecified: Secondary | ICD-10-CM | POA: Diagnosis not present

## 2020-01-26 DIAGNOSIS — S82102A Unspecified fracture of upper end of left tibia, initial encounter for closed fracture: Secondary | ICD-10-CM | POA: Diagnosis not present

## 2020-01-26 DIAGNOSIS — M359 Systemic involvement of connective tissue, unspecified: Secondary | ICD-10-CM | POA: Diagnosis not present

## 2020-02-24 DIAGNOSIS — M3320 Polymyositis, organ involvement unspecified: Secondary | ICD-10-CM | POA: Diagnosis not present

## 2020-02-25 DIAGNOSIS — M359 Systemic involvement of connective tissue, unspecified: Secondary | ICD-10-CM | POA: Diagnosis not present

## 2020-02-25 DIAGNOSIS — G7100 Muscular dystrophy, unspecified: Secondary | ICD-10-CM | POA: Diagnosis not present

## 2020-02-25 DIAGNOSIS — S82102A Unspecified fracture of upper end of left tibia, initial encounter for closed fracture: Secondary | ICD-10-CM | POA: Diagnosis not present

## 2020-02-25 DIAGNOSIS — M3320 Polymyositis, organ involvement unspecified: Secondary | ICD-10-CM | POA: Diagnosis not present

## 2020-03-24 DIAGNOSIS — M3320 Polymyositis, organ involvement unspecified: Secondary | ICD-10-CM | POA: Diagnosis not present

## 2020-03-27 DIAGNOSIS — M3320 Polymyositis, organ involvement unspecified: Secondary | ICD-10-CM | POA: Diagnosis not present

## 2020-03-27 DIAGNOSIS — G7100 Muscular dystrophy, unspecified: Secondary | ICD-10-CM | POA: Diagnosis not present

## 2020-03-27 DIAGNOSIS — S82102A Unspecified fracture of upper end of left tibia, initial encounter for closed fracture: Secondary | ICD-10-CM | POA: Diagnosis not present

## 2020-03-27 DIAGNOSIS — M359 Systemic involvement of connective tissue, unspecified: Secondary | ICD-10-CM | POA: Diagnosis not present

## 2020-03-29 DIAGNOSIS — M3320 Polymyositis, organ involvement unspecified: Secondary | ICD-10-CM | POA: Diagnosis not present

## 2020-03-29 DIAGNOSIS — M810 Age-related osteoporosis without current pathological fracture: Secondary | ICD-10-CM | POA: Diagnosis not present

## 2020-03-29 DIAGNOSIS — I10 Essential (primary) hypertension: Secondary | ICD-10-CM | POA: Diagnosis not present

## 2020-03-29 DIAGNOSIS — Z7952 Long term (current) use of systemic steroids: Secondary | ICD-10-CM | POA: Diagnosis not present

## 2020-03-29 DIAGNOSIS — E785 Hyperlipidemia, unspecified: Secondary | ICD-10-CM | POA: Diagnosis not present

## 2020-03-29 DIAGNOSIS — Z79899 Other long term (current) drug therapy: Secondary | ICD-10-CM | POA: Diagnosis not present

## 2020-04-27 DIAGNOSIS — S82102A Unspecified fracture of upper end of left tibia, initial encounter for closed fracture: Secondary | ICD-10-CM | POA: Diagnosis not present

## 2020-04-27 DIAGNOSIS — M359 Systemic involvement of connective tissue, unspecified: Secondary | ICD-10-CM | POA: Diagnosis not present

## 2020-04-27 DIAGNOSIS — M3320 Polymyositis, organ involvement unspecified: Secondary | ICD-10-CM | POA: Diagnosis not present

## 2020-04-27 DIAGNOSIS — G7100 Muscular dystrophy, unspecified: Secondary | ICD-10-CM | POA: Diagnosis not present

## 2020-05-19 DIAGNOSIS — M810 Age-related osteoporosis without current pathological fracture: Secondary | ICD-10-CM | POA: Diagnosis not present

## 2020-05-27 DIAGNOSIS — S82102A Unspecified fracture of upper end of left tibia, initial encounter for closed fracture: Secondary | ICD-10-CM | POA: Diagnosis not present

## 2020-05-27 DIAGNOSIS — M3320 Polymyositis, organ involvement unspecified: Secondary | ICD-10-CM | POA: Diagnosis not present

## 2020-05-27 DIAGNOSIS — M359 Systemic involvement of connective tissue, unspecified: Secondary | ICD-10-CM | POA: Diagnosis not present

## 2020-05-27 DIAGNOSIS — G7100 Muscular dystrophy, unspecified: Secondary | ICD-10-CM | POA: Diagnosis not present

## 2020-06-04 IMAGING — CR DG TIBIA/FIBULA 2V*L*
3 series · 3 of 3 positions shown · non-contrast
Comparison: None.

CLINICAL DATA: Initial evaluation for acute trauma, fall.

EXAM:
LEFT TIBIA AND FIBULA - 2 VIEW

[x tib-fib ap left]
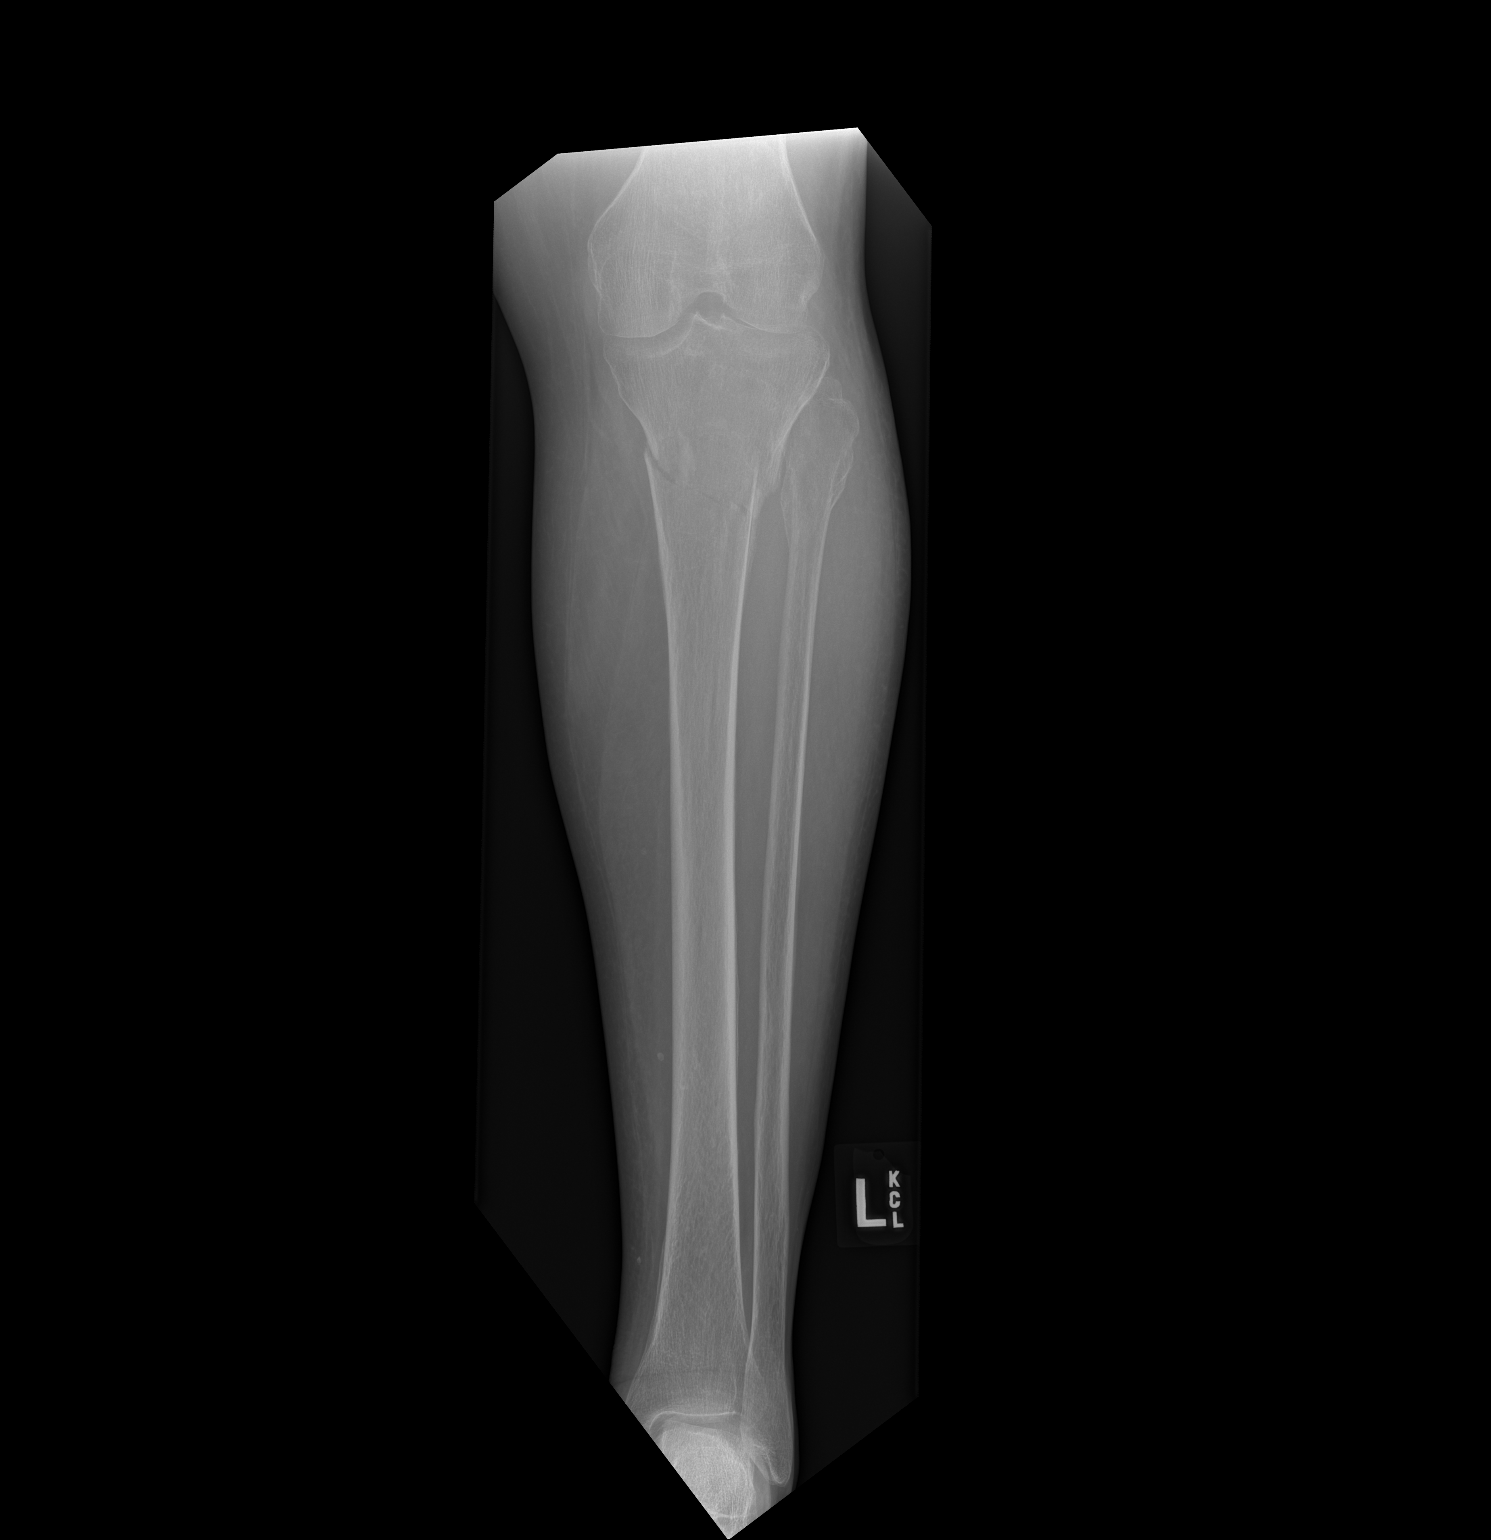

[x tib-fib lat left (1 of 2)]
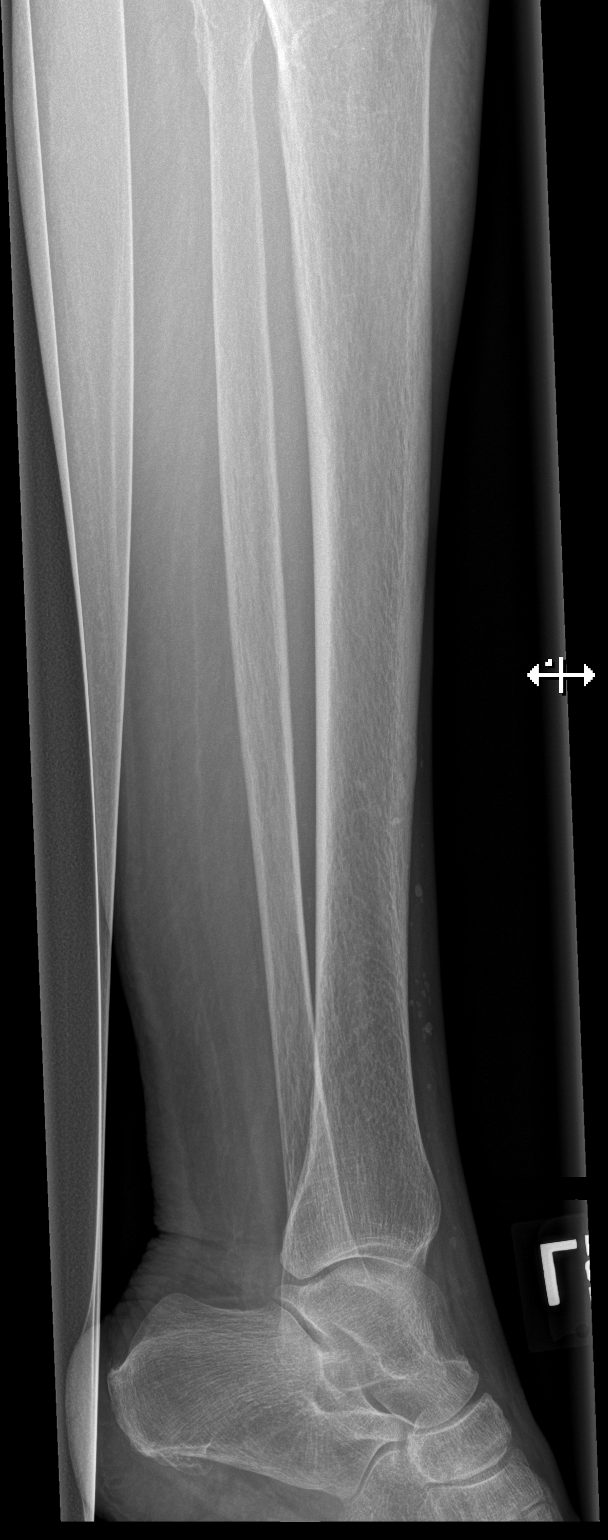

[x tib-fib lat left (2 of 2)]
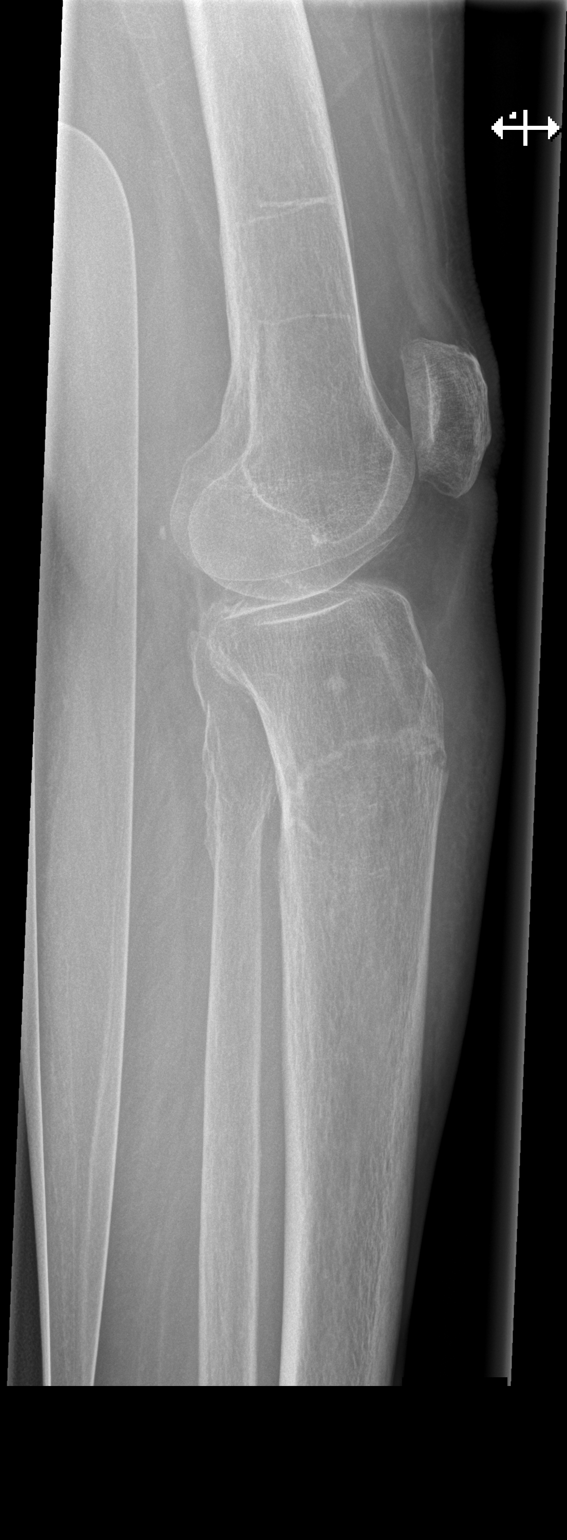

[3 of 3 positions shown; findings below may reference images not displayed]

FINDINGS: Acute oblique fracture extends through the proximal left tibial
shaft with 5 mm lateral displacement. Remotely healed fracture of
the left fibular neck. Question faint linear lucency extending
through this region, raising the possibility for a an acute on
chronic component. No other acute fracture or dislocation about the
tibia or fibula. Underlying osteopenia. Soft tissue swelling seen
about the proximal leg.
IMPRESSION: 1. Acute oblique fracture through the proximal left tibial shaft
with mild lateral displacement.
2. Remotely healed fracture of the left fibular neck. Question faint
linear lucency extending through this region, raising the
possibility for an acute on chronic component.

## 2020-06-05 IMAGING — DX DG KNEE 1-2V PORT*L*
1 series · 2 of 2 positions shown · non-contrast
Comparison: September 22, 2019

CLINICAL DATA: Postoperative evaluation.

EXAM:
PORTABLE LEFT KNEE - 1-2 VIEW

[Series 1: knee · 0.14mm/px · 2 of 2 slices shown]
[im 1/2]
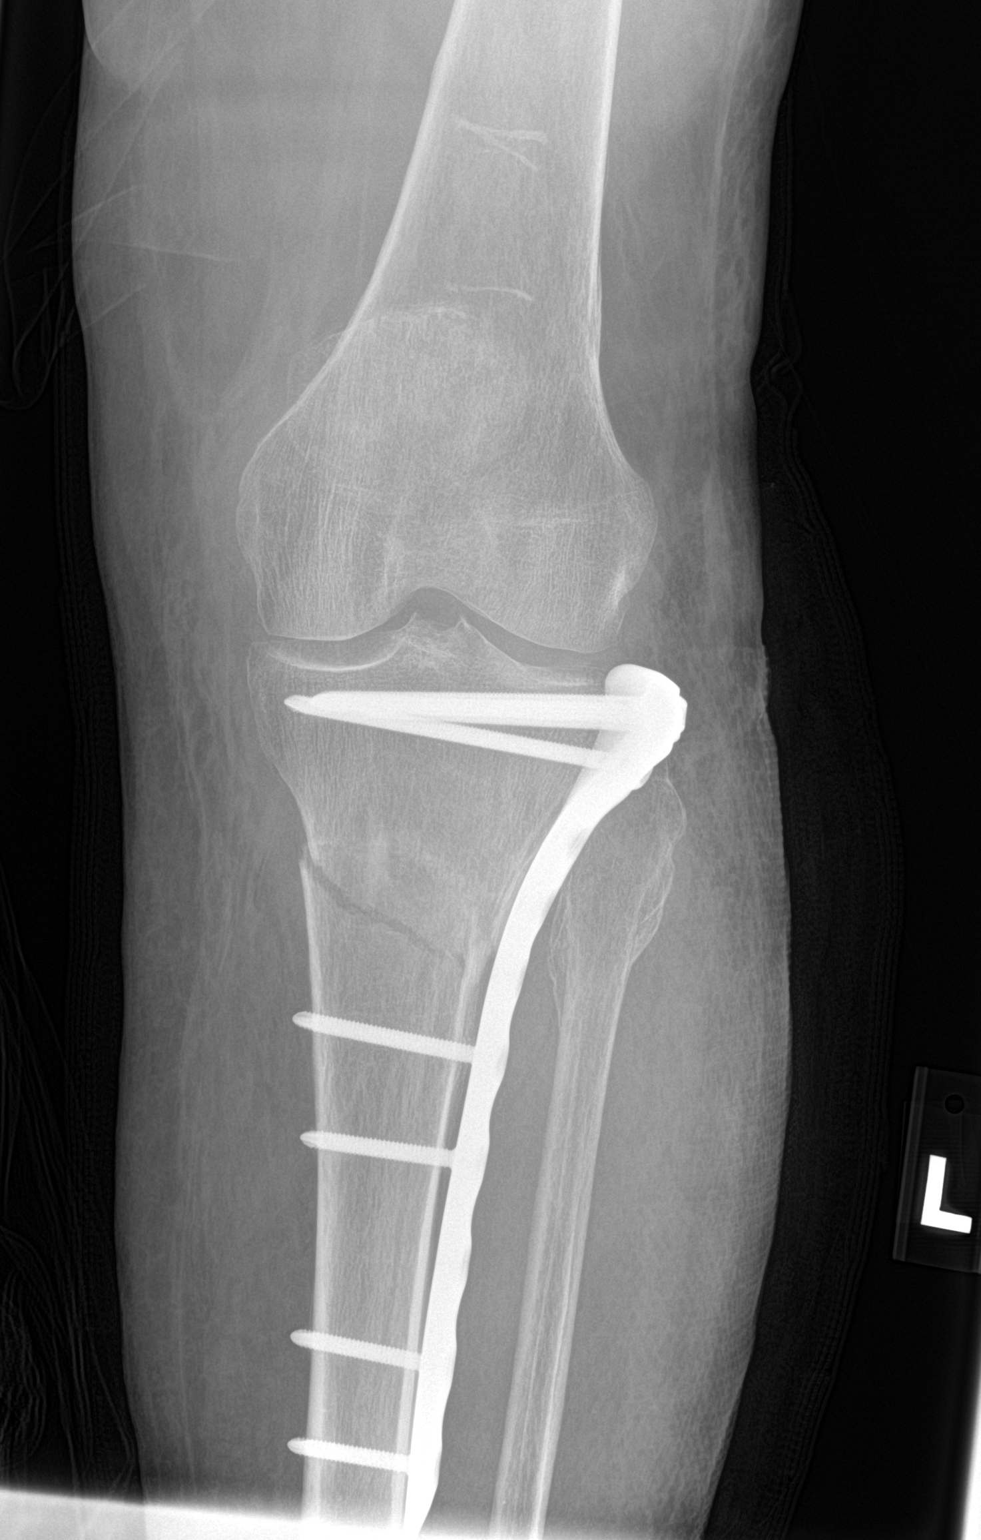
[im 2/2]
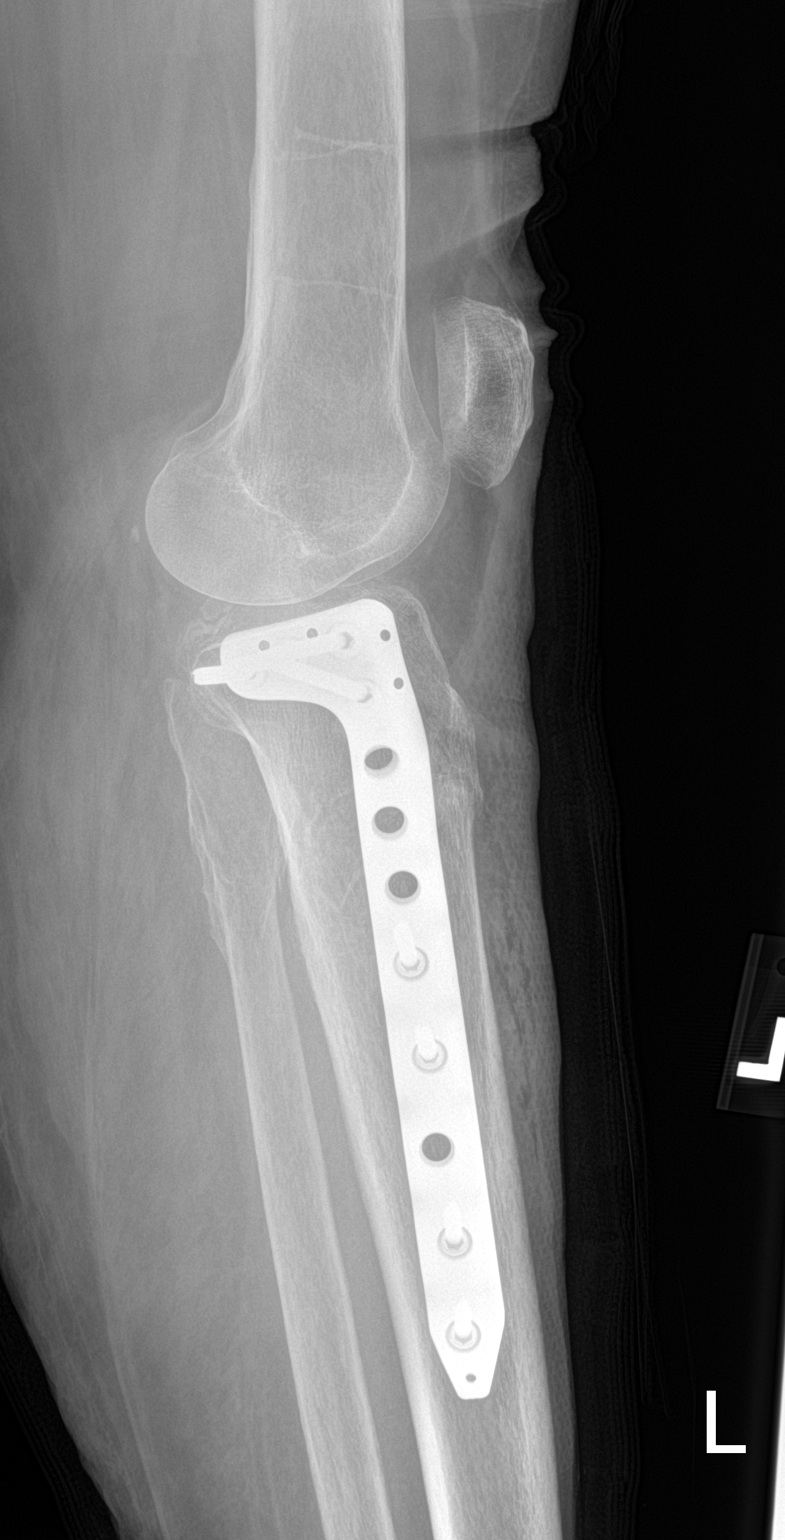

[2 of 2 positions shown; findings below may reference images not displayed]

FINDINGS: A large radiopaque fixation plate is seen along the lateral aspect
of the proximal to mid left tibia. Multiple proximal left tibial
fixation screws are also noted. An acute fracture is seen extending
through the proximal left tibial shaft with gross anatomic
alignment. No evidence of arthropathy or other focal bone
abnormality. Soft tissues are unremarkable.
IMPRESSION: 1. Status post open reduction and internal fixation of the proximal
left tibia.

## 2020-06-05 IMAGING — RF DG TIBIA/FIBULA 2V*L*
1 series · 3 of 3 positions shown · non-contrast
Comparison: 09/21/2019

CLINICAL DATA: ORIF tibial fracture

EXAM:
LEFT TIBIA AND FIBULA - 2 VIEW; DG C-ARM 1-60 MIN

[Series 1: run · 3 of 3 slices shown]
[im 1/3]
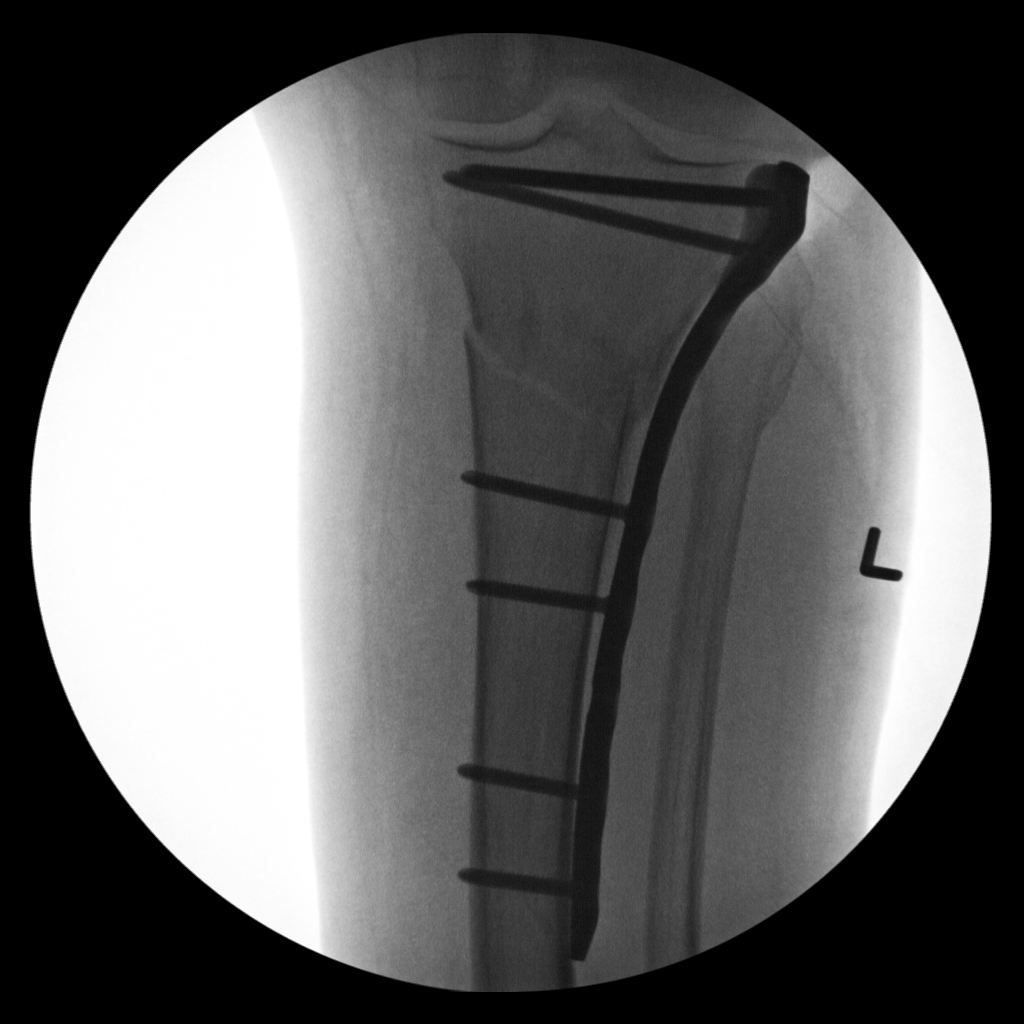
[im 2/3]
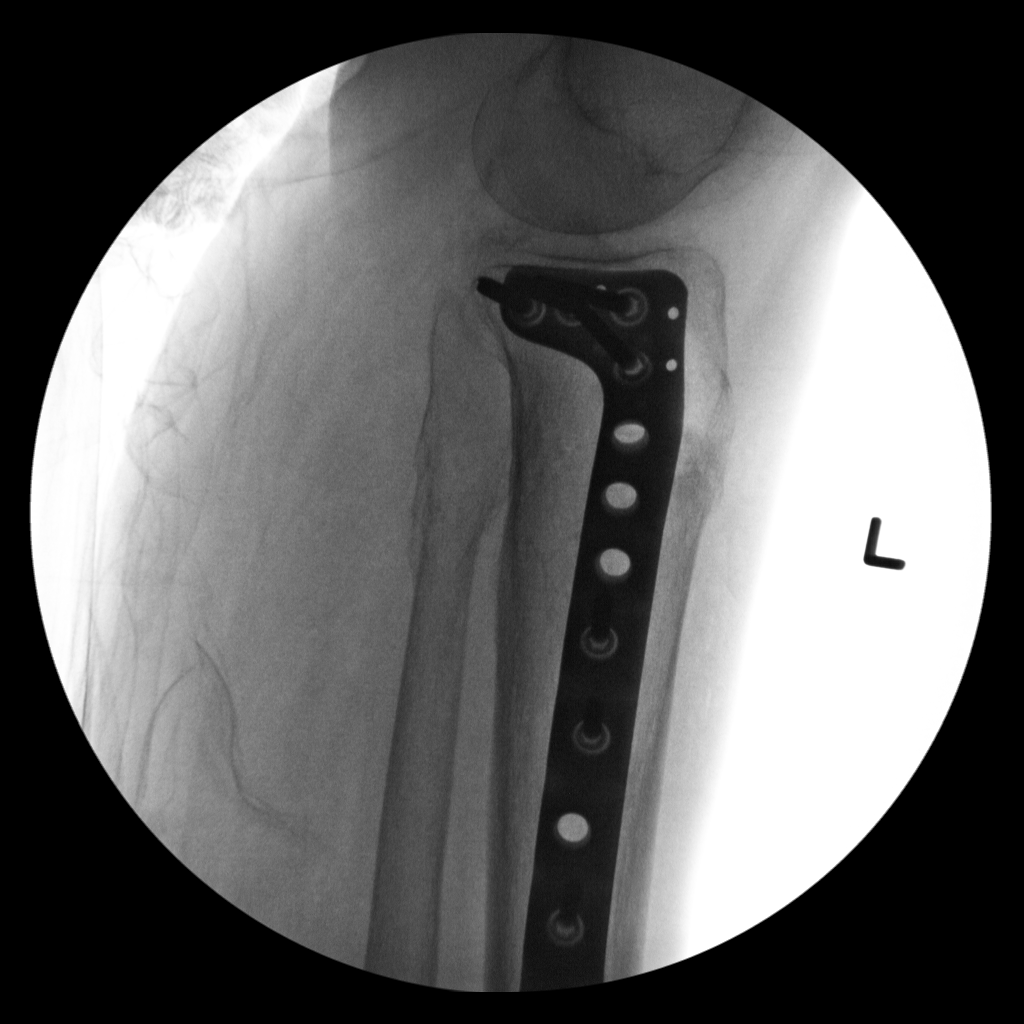
[im 3/3]
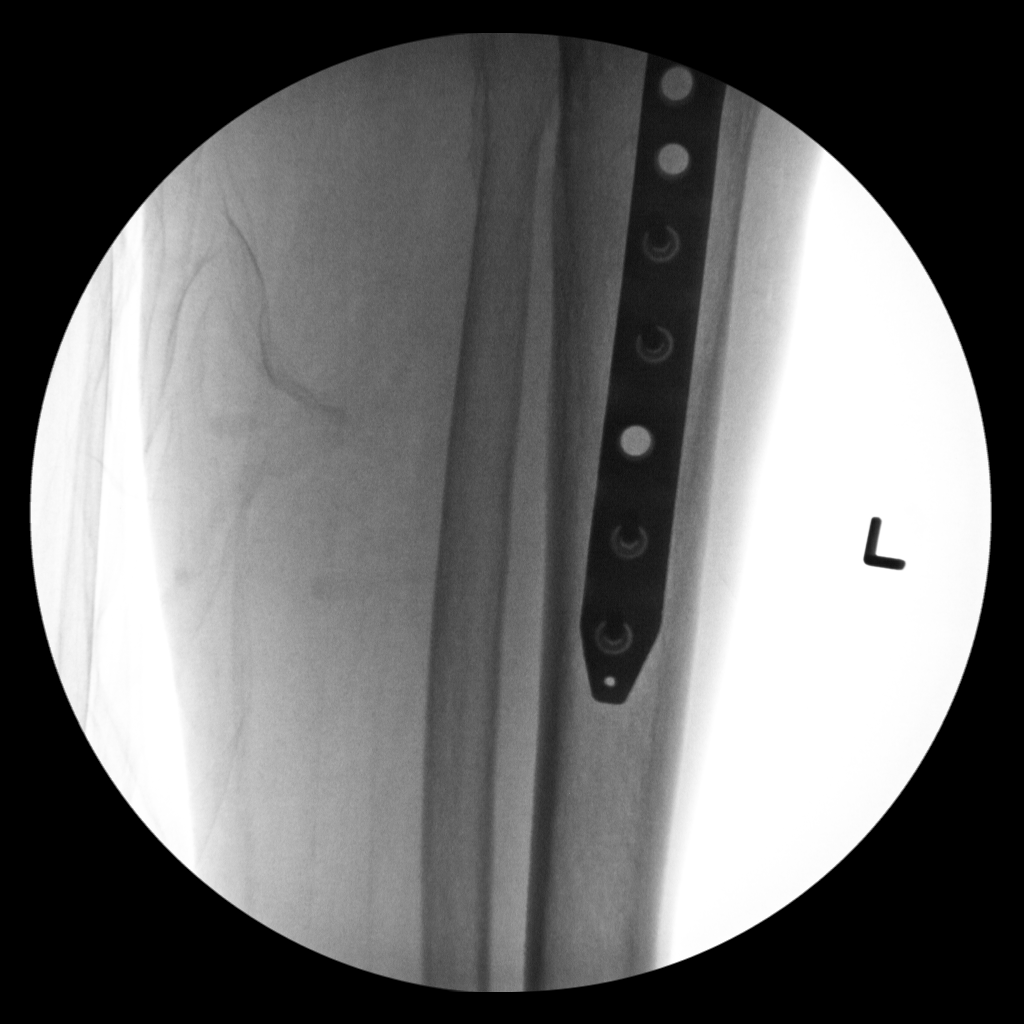

[3 of 3 positions shown; findings below may reference images not displayed]

FINDINGS: Proximal tibial fracture has been fixed with a lateral plate and
screws. Satisfactory alignment of the fracture.
IMPRESSION: ORIF proximal tibial fracture.

## 2020-06-22 ENCOUNTER — Ambulatory Visit: Payer: Medicare HMO

## 2020-06-22 ENCOUNTER — Ambulatory Visit
Admission: RE | Admit: 2020-06-22 | Discharge: 2020-06-22 | Disposition: A | Discharge status: Home / Self Care | Payer: Medicare HMO | Source: Ambulatory Visit | Attending: Internal Medicine | Admitting: Internal Medicine

## 2020-06-22 ENCOUNTER — Other Ambulatory Visit: Payer: Self-pay

## 2020-06-22 DIAGNOSIS — Z1231 Encounter for screening mammogram for malignant neoplasm of breast: Secondary | ICD-10-CM

## 2020-06-27 DIAGNOSIS — G7100 Muscular dystrophy, unspecified: Secondary | ICD-10-CM | POA: Diagnosis not present

## 2020-06-27 DIAGNOSIS — M359 Systemic involvement of connective tissue, unspecified: Secondary | ICD-10-CM | POA: Diagnosis not present

## 2020-06-27 DIAGNOSIS — M3320 Polymyositis, organ involvement unspecified: Secondary | ICD-10-CM | POA: Diagnosis not present

## 2020-06-27 DIAGNOSIS — S82102A Unspecified fracture of upper end of left tibia, initial encounter for closed fracture: Secondary | ICD-10-CM | POA: Diagnosis not present

## 2020-07-01 DIAGNOSIS — E785 Hyperlipidemia, unspecified: Secondary | ICD-10-CM | POA: Diagnosis not present

## 2020-07-01 DIAGNOSIS — Z23 Encounter for immunization: Secondary | ICD-10-CM | POA: Diagnosis not present

## 2020-07-01 DIAGNOSIS — Z79899 Other long term (current) drug therapy: Secondary | ICD-10-CM | POA: Diagnosis not present

## 2020-07-01 DIAGNOSIS — M3320 Polymyositis, organ involvement unspecified: Secondary | ICD-10-CM | POA: Diagnosis not present

## 2020-07-01 DIAGNOSIS — I10 Essential (primary) hypertension: Secondary | ICD-10-CM | POA: Diagnosis not present

## 2020-07-01 DIAGNOSIS — M810 Age-related osteoporosis without current pathological fracture: Secondary | ICD-10-CM | POA: Diagnosis not present

## 2020-07-01 DIAGNOSIS — Z7952 Long term (current) use of systemic steroids: Secondary | ICD-10-CM | POA: Diagnosis not present

## 2020-07-25 DIAGNOSIS — M3320 Polymyositis, organ involvement unspecified: Secondary | ICD-10-CM | POA: Diagnosis not present

## 2020-08-09 DIAGNOSIS — M3320 Polymyositis, organ involvement unspecified: Secondary | ICD-10-CM | POA: Diagnosis not present

## 2020-09-26 DIAGNOSIS — M3320 Polymyositis, organ involvement unspecified: Secondary | ICD-10-CM | POA: Diagnosis not present

## 2020-09-26 DIAGNOSIS — E7800 Pure hypercholesterolemia, unspecified: Secondary | ICD-10-CM | POA: Diagnosis not present

## 2020-09-26 DIAGNOSIS — E785 Hyperlipidemia, unspecified: Secondary | ICD-10-CM | POA: Diagnosis not present

## 2020-09-26 DIAGNOSIS — R7303 Prediabetes: Secondary | ICD-10-CM | POA: Diagnosis not present

## 2020-09-26 DIAGNOSIS — I2510 Atherosclerotic heart disease of native coronary artery without angina pectoris: Secondary | ICD-10-CM | POA: Diagnosis not present

## 2020-09-26 DIAGNOSIS — Z23 Encounter for immunization: Secondary | ICD-10-CM | POA: Diagnosis not present

## 2020-09-26 DIAGNOSIS — Z7952 Long term (current) use of systemic steroids: Secondary | ICD-10-CM | POA: Diagnosis not present

## 2020-09-26 DIAGNOSIS — I10 Essential (primary) hypertension: Secondary | ICD-10-CM | POA: Diagnosis not present

## 2020-09-26 DIAGNOSIS — Z0000 Encounter for general adult medical examination without abnormal findings: Secondary | ICD-10-CM | POA: Diagnosis not present

## 2020-09-26 DIAGNOSIS — R195 Other fecal abnormalities: Secondary | ICD-10-CM | POA: Diagnosis not present

## 2020-09-26 DIAGNOSIS — M810 Age-related osteoporosis without current pathological fracture: Secondary | ICD-10-CM | POA: Diagnosis not present

## 2020-09-27 ENCOUNTER — Other Ambulatory Visit: Payer: Self-pay | Admitting: Internal Medicine

## 2020-09-27 DIAGNOSIS — M8000XA Age-related osteoporosis with current pathological fracture, unspecified site, initial encounter for fracture: Secondary | ICD-10-CM

## 2020-09-30 DIAGNOSIS — I10 Essential (primary) hypertension: Secondary | ICD-10-CM | POA: Diagnosis not present

## 2020-09-30 DIAGNOSIS — I2510 Atherosclerotic heart disease of native coronary artery without angina pectoris: Secondary | ICD-10-CM | POA: Diagnosis not present

## 2020-09-30 DIAGNOSIS — R69 Illness, unspecified: Secondary | ICD-10-CM | POA: Diagnosis not present

## 2020-09-30 DIAGNOSIS — E7849 Other hyperlipidemia: Secondary | ICD-10-CM | POA: Diagnosis not present

## 2020-09-30 DIAGNOSIS — M3320 Polymyositis, organ involvement unspecified: Secondary | ICD-10-CM | POA: Diagnosis not present

## 2020-09-30 DIAGNOSIS — E559 Vitamin D deficiency, unspecified: Secondary | ICD-10-CM | POA: Diagnosis not present

## 2020-09-30 DIAGNOSIS — L89322 Pressure ulcer of left buttock, stage 2: Secondary | ICD-10-CM | POA: Diagnosis not present

## 2020-10-06 DIAGNOSIS — I2510 Atherosclerotic heart disease of native coronary artery without angina pectoris: Secondary | ICD-10-CM | POA: Diagnosis not present

## 2020-10-06 DIAGNOSIS — E559 Vitamin D deficiency, unspecified: Secondary | ICD-10-CM | POA: Diagnosis not present

## 2020-10-06 DIAGNOSIS — I10 Essential (primary) hypertension: Secondary | ICD-10-CM | POA: Diagnosis not present

## 2020-10-06 DIAGNOSIS — Z743 Need for continuous supervision: Secondary | ICD-10-CM | POA: Diagnosis not present

## 2020-10-06 DIAGNOSIS — T300 Burn of unspecified body region, unspecified degree: Secondary | ICD-10-CM | POA: Diagnosis not present

## 2020-10-06 DIAGNOSIS — E7849 Other hyperlipidemia: Secondary | ICD-10-CM | POA: Diagnosis not present

## 2020-10-06 DIAGNOSIS — M3320 Polymyositis, organ involvement unspecified: Secondary | ICD-10-CM | POA: Diagnosis not present

## 2020-10-06 DIAGNOSIS — L89322 Pressure ulcer of left buttock, stage 2: Secondary | ICD-10-CM | POA: Diagnosis not present

## 2020-10-06 DIAGNOSIS — R69 Illness, unspecified: Secondary | ICD-10-CM | POA: Diagnosis not present

## 2020-10-11 DIAGNOSIS — M3320 Polymyositis, organ involvement unspecified: Secondary | ICD-10-CM | POA: Diagnosis not present

## 2020-10-11 DIAGNOSIS — E559 Vitamin D deficiency, unspecified: Secondary | ICD-10-CM | POA: Diagnosis not present

## 2020-10-11 DIAGNOSIS — I10 Essential (primary) hypertension: Secondary | ICD-10-CM | POA: Diagnosis not present

## 2020-10-11 DIAGNOSIS — E7849 Other hyperlipidemia: Secondary | ICD-10-CM | POA: Diagnosis not present

## 2020-10-11 DIAGNOSIS — L89322 Pressure ulcer of left buttock, stage 2: Secondary | ICD-10-CM | POA: Diagnosis not present

## 2020-10-11 DIAGNOSIS — I2510 Atherosclerotic heart disease of native coronary artery without angina pectoris: Secondary | ICD-10-CM | POA: Diagnosis not present

## 2020-10-11 DIAGNOSIS — R69 Illness, unspecified: Secondary | ICD-10-CM | POA: Diagnosis not present

## 2020-10-18 DIAGNOSIS — I10 Essential (primary) hypertension: Secondary | ICD-10-CM | POA: Diagnosis not present

## 2020-10-18 DIAGNOSIS — M3320 Polymyositis, organ involvement unspecified: Secondary | ICD-10-CM | POA: Diagnosis not present

## 2020-10-18 DIAGNOSIS — E7849 Other hyperlipidemia: Secondary | ICD-10-CM | POA: Diagnosis not present

## 2020-10-18 DIAGNOSIS — E559 Vitamin D deficiency, unspecified: Secondary | ICD-10-CM | POA: Diagnosis not present

## 2020-10-18 DIAGNOSIS — I2510 Atherosclerotic heart disease of native coronary artery without angina pectoris: Secondary | ICD-10-CM | POA: Diagnosis not present

## 2020-10-18 DIAGNOSIS — L89322 Pressure ulcer of left buttock, stage 2: Secondary | ICD-10-CM | POA: Diagnosis not present

## 2020-10-18 DIAGNOSIS — R69 Illness, unspecified: Secondary | ICD-10-CM | POA: Diagnosis not present

## 2020-10-24 DIAGNOSIS — I10 Essential (primary) hypertension: Secondary | ICD-10-CM | POA: Diagnosis not present

## 2020-10-24 DIAGNOSIS — M810 Age-related osteoporosis without current pathological fracture: Secondary | ICD-10-CM | POA: Diagnosis not present

## 2020-10-24 DIAGNOSIS — I2510 Atherosclerotic heart disease of native coronary artery without angina pectoris: Secondary | ICD-10-CM | POA: Diagnosis not present

## 2020-10-25 DIAGNOSIS — M3320 Polymyositis, organ involvement unspecified: Secondary | ICD-10-CM | POA: Diagnosis not present

## 2020-10-25 DIAGNOSIS — I10 Essential (primary) hypertension: Secondary | ICD-10-CM | POA: Diagnosis not present

## 2020-10-25 DIAGNOSIS — M810 Age-related osteoporosis without current pathological fracture: Secondary | ICD-10-CM | POA: Diagnosis not present

## 2020-10-25 DIAGNOSIS — R69 Illness, unspecified: Secondary | ICD-10-CM | POA: Diagnosis not present

## 2020-10-25 DIAGNOSIS — L89322 Pressure ulcer of left buttock, stage 2: Secondary | ICD-10-CM | POA: Diagnosis not present

## 2020-10-25 DIAGNOSIS — I2510 Atherosclerotic heart disease of native coronary artery without angina pectoris: Secondary | ICD-10-CM | POA: Diagnosis not present

## 2020-10-25 DIAGNOSIS — E559 Vitamin D deficiency, unspecified: Secondary | ICD-10-CM | POA: Diagnosis not present

## 2020-10-25 DIAGNOSIS — E7849 Other hyperlipidemia: Secondary | ICD-10-CM | POA: Diagnosis not present

## 2020-10-31 DIAGNOSIS — I10 Essential (primary) hypertension: Secondary | ICD-10-CM | POA: Diagnosis not present

## 2020-10-31 DIAGNOSIS — R195 Other fecal abnormalities: Secondary | ICD-10-CM | POA: Diagnosis not present

## 2020-10-31 DIAGNOSIS — L89302 Pressure ulcer of unspecified buttock, stage 2: Secondary | ICD-10-CM | POA: Diagnosis not present

## 2020-10-31 DIAGNOSIS — R69 Illness, unspecified: Secondary | ICD-10-CM | POA: Diagnosis not present

## 2020-11-08 DIAGNOSIS — E559 Vitamin D deficiency, unspecified: Secondary | ICD-10-CM | POA: Diagnosis not present

## 2020-11-08 DIAGNOSIS — R69 Illness, unspecified: Secondary | ICD-10-CM | POA: Diagnosis not present

## 2020-11-08 DIAGNOSIS — M3320 Polymyositis, organ involvement unspecified: Secondary | ICD-10-CM | POA: Diagnosis not present

## 2020-11-08 DIAGNOSIS — I10 Essential (primary) hypertension: Secondary | ICD-10-CM | POA: Diagnosis not present

## 2020-11-08 DIAGNOSIS — I2510 Atherosclerotic heart disease of native coronary artery without angina pectoris: Secondary | ICD-10-CM | POA: Diagnosis not present

## 2020-11-08 DIAGNOSIS — E7849 Other hyperlipidemia: Secondary | ICD-10-CM | POA: Diagnosis not present

## 2020-11-08 DIAGNOSIS — L89322 Pressure ulcer of left buttock, stage 2: Secondary | ICD-10-CM | POA: Diagnosis not present

## 2020-11-10 DIAGNOSIS — M810 Age-related osteoporosis without current pathological fracture: Secondary | ICD-10-CM | POA: Diagnosis not present

## 2020-11-10 DIAGNOSIS — Z7952 Long term (current) use of systemic steroids: Secondary | ICD-10-CM | POA: Diagnosis not present

## 2020-11-10 DIAGNOSIS — M3320 Polymyositis, organ involvement unspecified: Secondary | ICD-10-CM | POA: Diagnosis not present

## 2020-11-10 DIAGNOSIS — Z79899 Other long term (current) drug therapy: Secondary | ICD-10-CM | POA: Diagnosis not present

## 2020-11-10 DIAGNOSIS — E785 Hyperlipidemia, unspecified: Secondary | ICD-10-CM | POA: Diagnosis not present

## 2020-11-10 DIAGNOSIS — I10 Essential (primary) hypertension: Secondary | ICD-10-CM | POA: Diagnosis not present

## 2020-11-23 DIAGNOSIS — M810 Age-related osteoporosis without current pathological fracture: Secondary | ICD-10-CM | POA: Diagnosis not present

## 2020-11-24 DIAGNOSIS — I2510 Atherosclerotic heart disease of native coronary artery without angina pectoris: Secondary | ICD-10-CM | POA: Diagnosis not present

## 2020-11-24 DIAGNOSIS — M810 Age-related osteoporosis without current pathological fracture: Secondary | ICD-10-CM | POA: Diagnosis not present

## 2020-11-24 DIAGNOSIS — I10 Essential (primary) hypertension: Secondary | ICD-10-CM | POA: Diagnosis not present

## 2020-11-25 ENCOUNTER — Encounter: Payer: Self-pay | Admitting: Gastroenterology

## 2020-11-25 ENCOUNTER — Ambulatory Visit: Payer: Medicare HMO | Admitting: Gastroenterology

## 2020-11-25 VITALS — BP 124/72 | HR 67 | Ht 63.25 in | Wt 147.0 lb

## 2020-11-25 DIAGNOSIS — R195 Other fecal abnormalities: Secondary | ICD-10-CM

## 2020-11-25 DIAGNOSIS — R69 Illness, unspecified: Secondary | ICD-10-CM | POA: Diagnosis not present

## 2020-11-25 DIAGNOSIS — E7849 Other hyperlipidemia: Secondary | ICD-10-CM | POA: Diagnosis not present

## 2020-11-25 DIAGNOSIS — I2510 Atherosclerotic heart disease of native coronary artery without angina pectoris: Secondary | ICD-10-CM | POA: Diagnosis not present

## 2020-11-25 DIAGNOSIS — L89322 Pressure ulcer of left buttock, stage 2: Secondary | ICD-10-CM | POA: Diagnosis not present

## 2020-11-25 DIAGNOSIS — M3320 Polymyositis, organ involvement unspecified: Secondary | ICD-10-CM | POA: Diagnosis not present

## 2020-11-25 DIAGNOSIS — E559 Vitamin D deficiency, unspecified: Secondary | ICD-10-CM | POA: Diagnosis not present

## 2020-11-25 DIAGNOSIS — I10 Essential (primary) hypertension: Secondary | ICD-10-CM | POA: Diagnosis not present

## 2020-11-25 NOTE — Patient Instructions (Signed)
If you are age 62 or older, your body mass index should be between 23-30. Your Body mass index is 25.83 kg/m. If this is out of the aforementioned range listed, please consider follow up with your Primary Care Provider.  If you are age 31 or younger, your body mass index should be between 19-25. Your Body mass index is 25.83 kg/m. If this is out of the aformentioned range listed, please consider follow up with your Primary Care Provider.   We will be in touch in regards to getting you scheduled for a colonoscopy.  It was a pleasure to see you today!  Dr. Loletha Carrow

## 2020-11-25 NOTE — Progress Notes (Signed)
Jessica Burke:  History: Jessica Burke 11/25/2020  Referring provider: Deland Pretty, MD  Reason for consult/chief complaint: Positive cologard (Done at PCP office, patient hasn't seen any blood)   Subjective  HPI:  This is a 60 year old woman accompanied by her husband and referred by primary care for a positive Cologuard. She reports having 2 or 3 colonoscopies over the years with Dr. Collene Burke, and got a letter in 2019 that she was due for another because of a history of polyps.  She did not pursue that initially because of the pandemic, then because she had a fall and broke her leg.  She has ongoing mobility issues with that and her polymyositis.  She reports contacting Dr. Lorie Burke office at some point to schedule the colonoscopy, and recalls being told that she needed to contact her primary care provider to be admitted to the hospital for bowel preparation.  Last fall Jessica Burke did a Cologuard test that was positive, and it is referenced in his January clinic Burke that accompanied her visit. She believes it is a false positive since she does not see any blood. Jessica Burke is a somewhat limited historian, she has had some intermittent bloating and dyspeptic symptoms that she attributes to some medicines that she has received over the years.  She does not seem to have chronic constipation or diarrhea and she denies blood per rectum. She and her husband state that he needs to help her up from the wheelchair because she cannot get up on her own.  If he does so, she can then walk a short distance.  Other than that, it sounds like she is wheelchair-bound.  ROS:  Review of Systems  Constitutional: Positive for fatigue. Negative for appetite change and unexpected weight change.  HENT: Negative for mouth sores and voice change.   Eyes: Negative for pain and redness.  Respiratory: Negative for cough and shortness of breath.   Cardiovascular: Negative for chest pain  and palpitations.  Genitourinary: Negative for dysuria and hematuria.  Musculoskeletal: Positive for myalgias. Negative for arthralgias.       Chronic left leg pain  Skin: Negative for pallor and rash.  Neurological: Positive for weakness. Negative for headaches.  Hematological: Negative for adenopathy.     Past Medical History: Past Medical History:  Diagnosis Date  . Anemia   . Arthritis   . CAD (coronary artery disease)   . Chest pain 12/2016  . Colon polyps   . Depression    2003  . GERD (gastroesophageal reflux disease)   . H/O corticosteroid therapy 09/23/2019  . Hyperlipidemia   . Hypertension   . 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Jessica Burke)   . Osteoporosis   . Pneumonia    2016  . Polymyositis ([place]) 12/2016  . Pre-diabetes   . Shortness of breath dyspnea   . Vitamin D deficiency 09/24/2019     Past Surgical History: Past Surgical History:  Procedure Laterality Date  . CESAREAN SECTION    . COLONOSCOPY W/ BIOPSIES    . ENDOMETRIAL ABLATION    . LEEP  2006  . ORIF TIBIA FRACTURE Left 09/22/2019   Procedure: OPEN REDUCTION INTERNAL FIXATION (ORIF) PROXIMAL TIBIA FRACTURE;  Surgeon: Jessica Edgewater, MD;  Location: [facility];  Service: Orthopedics;  Laterality: Left;     Family History: Family History  Problem Relation Age of Onset  . Hypertension Father   . Heart disease Father        IHSS   . Hypertension Mother   .  Osteoporosis Mother   . Diabetes Maternal Aunt   . Breast cancer Maternal Aunt     Social History: Social History   Socioeconomic History  . Marital status: Married    Spouse name: Jessica Burke  . Number of children: 1  . Years of education: 28  . Highest education level: Not on file  Occupational History  . Occupation: retired  . Occupation: disability  Tobacco Use  . Smoking status: Former Smoker    Quit date: 12/25/1988    Years since quitting: 31.9  . Smokeless tobacco: Never Used  Vaping Use  . Vaping Use: Never used  Substance and Sexual Activity   . Alcohol use: No  . Drug use: No  . Sexual activity: Not on file  Other Topics Concern  . Not on file  Social History Narrative   Patient states she has about 4 cups of caffeine daily.   Patient is right handed.    Social Determinants of Health   Financial Resource Strain: Not on file  Food Insecurity: Not on file  Transportation Needs: No Transportation Needs  . Lack of Transportation (Medical): No  . Lack of Transportation (Non-Medical): No  Physical Activity: Not on file  Stress: Not on file  Social Connections: Not on file    Allergies: No Known Allergies  Outpatient Meds: Current Outpatient Medications  Medication Sig Dispense Refill  . acetaminophen (TYLENOL) 500 MG tablet Take 1 tablet (500 mg total) by mouth every 8 (eight) hours. 60 tablet 0  . ACTHAR 80 UNIT/ML injectable gel Inject 80 Units into the skin See admin instructions. Tuesday and Thursday one week and Wednesday and Friday then next week    . ascorbic acid (VITAMIN C) 500 MG tablet Take 1 tablet (500 mg total) by mouth daily. 30 tablet 1  . Cholecalciferol (VITAMIN D) 125 MCG (5000 UT) CAPS Take 1 capsule by mouth daily. 30 capsule 3  . denosumab (PROLIA) 60 MG/ML SOSY injection Inject 60 mg into the skin every 6 (six) months.    . feeding supplement, ENSURE ENLIVE, (ENSURE ENLIVE) LIQD Take 237 mLs by mouth 2 (two) times daily between meals. 237 mL 12  . Multiple Vitamin (MULTIVITAMIN) tablet Take 1 tablet by mouth daily.    . mupirocin ointment (BACTROBAN) 2 % Place 1 application into the nose 2 (two) times daily. 22 g 0  . riTUXimab (RITUXAN IV) Inject into the vein every 30 (thirty) days.    Marland Kitchen telmisartan-hydrochlorothiazide (MICARDIS HCT) 80-12.5 MG tablet Take 1 tablet by mouth daily.     No current facility-administered medications for this visit.      ___________________________________________________________________ Objective   Exam:  BP 124/72   Pulse 67   Ht 5' 3.25" (1.607 m)   Wt  147 lb (66.7 kg) Comment: at home  LMP 11/26/2012   BMI 25.83 kg/m  Wt Readings from Last 3 Encounters:  11/25/20 147 lb (66.7 kg)  09/22/19 163 lb (73.9 kg)  04/29/19 164 lb (74.4 kg)     General: Not acutely ill-appearing.  Eyes: sclera anicteric, no redness  ENT: oral mucosa moist without lesions, no cervical or supraclavicular lymphadenopathy  CV: RRR without murmur, S1/S2, no JVD, no peripheral edema  Resp: clear to auscultation bilaterally, normal RR and effort noted  GI: soft,, no tenderness, with active bowel sounds. No guarding or palpable organomegaly noted.  Entheses limited exam in wheelchair)  Skin; warm and dry, no rash or jaundice noted  Neuro: awake, alert and oriented x 3.  Normal gross motor function and fluent speech  Labs:  Most recent CBC and CMP normal   Assessment: Encounter Diagnosis  Name Primary?  . Positive colorectal cancer screening using Cologuard test Yes    We discussed that a positive Cologuard most often means one or more precancerous polyps, thus a colonoscopy is indicated.  There is both an approximately 15% false negative and false positive rate.  She is willing to undergo colonoscopy, but the bowel preparation will be quite difficult for her because of her poor mobility.  She does need admission to the hospital service for bowel preparation prior to a colonoscopy the following day. Based on our current schedule, this will likely be in May or June with current availability.  We will work on the scheduling and contact her in the near future.  She was agreeable after discussion of procedure and risks.  The benefits and risks of the planned procedure were described in detail with the patient or (when appropriate) their health care proxy.  Risks were outlined as including, but not limited to, bleeding, infection, perforation, adverse medication reaction leading to cardiac or pulmonary decompensation, pancreatitis (if ERCP).  The limitation  of incomplete mucosal visualization was also discussed.  No guarantees or warranties were given.   Thank you for the courtesy of this consult.  Please call me with any questions or concerns.  [name]  CC: Referring provider noted above

## 2020-12-19 DIAGNOSIS — M3320 Polymyositis, organ involvement unspecified: Secondary | ICD-10-CM | POA: Diagnosis not present

## 2020-12-21 ENCOUNTER — Other Ambulatory Visit: Payer: Self-pay

## 2020-12-21 DIAGNOSIS — R195 Other fecal abnormalities: Secondary | ICD-10-CM

## 2020-12-22 ENCOUNTER — Other Ambulatory Visit: Payer: Self-pay

## 2020-12-22 ENCOUNTER — Telehealth: Payer: Self-pay

## 2020-12-22 ENCOUNTER — Other Ambulatory Visit (HOSPITAL_COMMUNITY): Payer: Medicare HMO

## 2020-12-22 DIAGNOSIS — R195 Other fecal abnormalities: Secondary | ICD-10-CM

## 2020-12-22 NOTE — Telephone Encounter (Signed)
Per Dr Loletha Carrow pt needs to be added to the [facility] endo schedule for 12-27-2020 with an admission for prep on 12-26-2020 (Monday). Pt agrees and has been notified.  I have called and set up a 24 hour observation bed with Shawn (12-22-2020) [place] spoke to [facility] in [place] and they say the order we have in place for COVID should cover the test order upon arrival for admission.

## 2020-12-22 NOTE — Telephone Encounter (Signed)
Left a message on the pts home and cell phones.

## 2020-12-22 NOTE — Telephone Encounter (Signed)
Patient is aware of the orders for the hospital admission. She knows about clear liquids ALL day Monday.

## 2020-12-23 ENCOUNTER — Other Ambulatory Visit (HOSPITAL_COMMUNITY): Payer: Medicare HMO

## 2020-12-24 DIAGNOSIS — I2510 Atherosclerotic heart disease of native coronary artery without angina pectoris: Secondary | ICD-10-CM | POA: Diagnosis not present

## 2020-12-24 DIAGNOSIS — I10 Essential (primary) hypertension: Secondary | ICD-10-CM | POA: Diagnosis not present

## 2020-12-24 DIAGNOSIS — M810 Age-related osteoporosis without current pathological fracture: Secondary | ICD-10-CM | POA: Diagnosis not present

## 2020-12-26 ENCOUNTER — Other Ambulatory Visit: Payer: Self-pay

## 2020-12-26 ENCOUNTER — Observation Stay (HOSPITAL_COMMUNITY)
Admission: RE | Admit: 2020-12-26 | Discharge: 2020-12-27 | Disposition: A | Discharge status: Home / Self Care | Payer: Medicare HMO | Attending: Gastroenterology | Admitting: Gastroenterology

## 2020-12-26 ENCOUNTER — Encounter (HOSPITAL_COMMUNITY): Payer: Self-pay | Admitting: Gastroenterology

## 2020-12-26 DIAGNOSIS — D124 Benign neoplasm of descending colon: Principal | ICD-10-CM | POA: Insufficient documentation

## 2020-12-26 DIAGNOSIS — K5730 Diverticulosis of large intestine without perforation or abscess without bleeding: Secondary | ICD-10-CM | POA: Insufficient documentation

## 2020-12-26 DIAGNOSIS — D123 Benign neoplasm of transverse colon: Secondary | ICD-10-CM | POA: Diagnosis not present

## 2020-12-26 DIAGNOSIS — R195 Other fecal abnormalities: Secondary | ICD-10-CM | POA: Diagnosis present

## 2020-12-26 DIAGNOSIS — Z87891 Personal history of nicotine dependence: Secondary | ICD-10-CM | POA: Insufficient documentation

## 2020-12-26 DIAGNOSIS — Z7982 Long term (current) use of aspirin: Secondary | ICD-10-CM | POA: Diagnosis not present

## 2020-12-26 DIAGNOSIS — I10 Essential (primary) hypertension: Secondary | ICD-10-CM | POA: Insufficient documentation

## 2020-12-26 DIAGNOSIS — Z20822 Contact with and (suspected) exposure to covid-19: Secondary | ICD-10-CM | POA: Diagnosis not present

## 2020-12-26 DIAGNOSIS — Z1211 Encounter for screening for malignant neoplasm of colon: Secondary | ICD-10-CM

## 2020-12-26 DIAGNOSIS — Z79899 Other long term (current) drug therapy: Secondary | ICD-10-CM | POA: Diagnosis not present

## 2020-12-26 DIAGNOSIS — I2510 Atherosclerotic heart disease of native coronary artery without angina pectoris: Secondary | ICD-10-CM | POA: Diagnosis not present

## 2020-12-26 LAB — BASIC METABOLIC PANEL
Anion gap: 6 (ref 5–15)
BUN: 9 mg/dL (ref 8–23)
CO2: 31 mmol/L (ref 22–32)
Calcium: 9.1 mg/dL (ref 8.9–10.3)
Chloride: 105 mmol/L (ref 98–111)
Creatinine, Ser: <0.3 mg/dL — ABNORMAL LOW (ref 0.44–1.00)
Glucose, Bld: 97 mg/dL (ref 70–99)
Potassium: 4.2 mmol/L (ref 3.5–5.1)
Sodium: 142 mmol/L (ref 135–145)

## 2020-12-26 LAB — CBC
HCT: 41.6 % (ref 36.0–46.0)
Hemoglobin: 12.5 g/dL (ref 12.0–15.0)
MCH: 27.9 pg (ref 26.0–34.0)
MCHC: 30 g/dL (ref 30.0–36.0)
MCV: 92.9 fL (ref 80.0–100.0)
Platelets: 258 10*3/uL (ref 150–400)
RBC: 4.48 MIL/uL (ref 3.87–5.11)
RDW: 15 % (ref 11.5–15.5)
WBC: 11.6 10*3/uL — ABNORMAL HIGH (ref 4.0–10.5)
nRBC: 0 % (ref 0.0–0.2)

## 2020-12-26 LAB — HIV ANTIBODY (ROUTINE TESTING W REFLEX): HIV Screen 4th Generation wRfx: NONREACTIVE

## 2020-12-26 MED ORDER — PEG-KCL-NACL-NASULF-NA ASC-C 100 G PO SOLR: 1.0000 | Freq: Once | ORAL | Status: DC

## 2020-12-26 MED ORDER — ONDANSETRON HCL 4 MG PO TABS: 4.0000 mg | ORAL_TABLET | Freq: Four times a day (QID) | ORAL | Status: DC | PRN

## 2020-12-26 MED ORDER — PEG-KCL-NACL-NASULF-NA ASC-C 100 G PO SOLR
0.5000 | Freq: Once | ORAL | Status: AC
  Administered 2020-12-26: 100 g via ORAL
  Filled 2020-12-26: qty 1

## 2020-12-26 MED ORDER — SODIUM CHLORIDE 0.9 % IV SOLN: 250.0000 mL | INTRAVENOUS | Status: DC | PRN

## 2020-12-26 MED ORDER — HYDROCODONE-ACETAMINOPHEN 5-325 MG PO TABS: 1.0000 | ORAL_TABLET | ORAL | Status: DC | PRN

## 2020-12-26 MED ORDER — PANTOPRAZOLE SODIUM 40 MG PO TBEC
40.0000 mg | DELAYED_RELEASE_TABLET | Freq: Every day | ORAL | Status: DC
  Administered 2020-12-26: 40 mg via ORAL
  Filled 2020-12-26: qty 1

## 2020-12-26 MED ORDER — MORPHINE SULFATE (PF) 2 MG/ML IV SOLN
2.0000 mg | INTRAVENOUS | Status: DC | PRN
  Administered 2020-12-27: 2 mg via INTRAVENOUS
  Filled 2020-12-26: qty 1

## 2020-12-26 MED ORDER — IRBESARTAN 150 MG PO TABS
150.0000 mg | ORAL_TABLET | Freq: Every day | ORAL | Status: DC
  Administered 2020-12-26 – 2020-12-27 (×2): 150 mg via ORAL
  Filled 2020-12-26 (×2): qty 1

## 2020-12-26 MED ORDER — BISACODYL 5 MG PO TBEC
10.0000 mg | DELAYED_RELEASE_TABLET | Freq: Once | ORAL | Status: AC
  Administered 2020-12-26: 10 mg via ORAL
  Filled 2020-12-26: qty 2

## 2020-12-26 MED ORDER — SODIUM CHLORIDE 0.9% FLUSH: 3.0000 mL | Freq: Two times a day (BID) | INTRAVENOUS | Status: DC

## 2020-12-26 MED ORDER — SODIUM CHLORIDE 0.9 % IV SOLN: INTRAVENOUS | Status: DC

## 2020-12-26 MED ORDER — ACETAMINOPHEN 325 MG PO TABS: 650.0000 mg | ORAL_TABLET | Freq: Four times a day (QID) | ORAL | Status: DC | PRN

## 2020-12-26 MED ORDER — ONDANSETRON HCL 4 MG/2ML IJ SOLN
4.0000 mg | Freq: Four times a day (QID) | INTRAMUSCULAR | Status: DC | PRN
  Administered 2020-12-27: 4 mg via INTRAVENOUS
  Filled 2020-12-26: qty 2

## 2020-12-26 MED ORDER — SODIUM CHLORIDE 0.9% FLUSH: 3.0000 mL | INTRAVENOUS | Status: DC | PRN

## 2020-12-26 MED ORDER — ACETAMINOPHEN 650 MG RE SUPP: 650.0000 mg | Freq: Four times a day (QID) | RECTAL | Status: DC | PRN

## 2020-12-26 NOTE — H&P (Signed)
Admission Note  Primary Care Physician:  Deland Pretty, MD Primary Gastroenterologist:    Dr. Loletha Carrow  HPI: Jessica Burke is a 60 y.o. female, who was referred to our office after finding of a positive Cologuard.  Due to patient's disability with polymyositis and significant mobility issues she was unable to prep at home, and therefore admission planned for bowel prep and colonoscopy tomorrow. Patient has prior history of colon polyps and had had prior colonoscopies with Dr. Collene Mares.  Apparently she was due for follow-up colonoscopy in 2019.  Around that same time she had had a fall and had a fracture of her lower extremity, then did not pursue further because of the pandemic. Patient has no GI symptoms.  She has no complaints of abdominal discomfort, occasionally will have an episode of diarrhea or dyspepsia which she attributes to medications.  She has not noted any melena or hematochezia. No family history of colon cancer. Patient is able to ambulate a bit once she is helped out of bed or out of the wheelchair but is unable to transition on her own.  On arrival today patient had an elevated blood pressure with initial reading of 180/113.  She admits to being anxious and stressed She had not taken her blood pressure medication today.  She has since had her blood pressure medication and per nurse her blood pressure is back in the normal range.  WBC 11.6, hemoglobin 12.5.  Past Medical History:  Diagnosis Date  . Anemia   . Arthritis   . CAD (coronary artery disease)   . Chest pain 12/2016  . Colon polyps   . Depression    2003  . GERD (gastroesophageal reflux disease)   . H/O corticosteroid therapy 09/23/2019  . Hyperlipidemia   . Hypertension   . 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[place])   . Osteoporosis   . Pneumonia    2016  . 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 Pre-diabetes   . Shortness of breath dyspnea   . Vitamin D deficiency 09/24/2019    Past Surgical History:  Procedure Laterality  Date  . CESAREAN SECTION    . COLONOSCOPY W/ BIOPSIES    . ENDOMETRIAL ABLATION    . LEEP  2006  . ORIF TIBIA FRACTURE Left 09/22/2019   Procedure: OPEN REDUCTION INTERNAL FIXATION (ORIF) PROXIMAL TIBIA FRACTURE;  Surgeon: Altamese Wood Dale, MD;  Location: [facility];  Service: Orthopedics;  Laterality: Left;    Prior to Admission medications   Medication Sig Start Date End Date Taking? Authorizing Provider  acetaminophen (TYLENOL) 500 MG tablet Take 1 tablet (500 mg total) by mouth every 8 (eight) hours. Patient taking differently: Take 500 mg by mouth every 8 (eight) hours as needed for mild pain or moderate pain. 09/25/19  Yes Ainsley Spinner, PA-C  ACTHAR 80 UNIT/ML injectable gel Inject 80 Units into the skin once a week. Friday 04/20/19  Yes [provider]  APPLE CIDER VINEGAR PO Take 2 capsules by mouth daily. Gummies   Yes [provider]  ascorbic acid (VITAMIN C) 500 MG tablet Take 1 tablet (500 mg total) by mouth daily. 09/26/19  Yes Ainsley Spinner, PA-C  Ashwagandha 500 MG CAPS Take 1 drop by mouth daily. Gummie / Goli   Yes [provider]  aspirin EC 81 MG tablet Take 81 mg by mouth daily. Swallow whole.   Yes [provider]  Cholecalciferol (VITAMIN D) 125 MCG (5000 UT) CAPS Take 1 capsule by mouth daily. Patient taking differently: Take  5,000 Units by mouth daily. 09/25/19  Yes Ainsley Spinner, PA-C  denosumab (PROLIA) 60 MG/ML SOSY injection Inject 60 mg into the skin every 6 (six) months.   Yes [provider]  Multiple Vitamin (MULTIVITAMIN) tablet Take 1 tablet by mouth daily.   Yes [provider]  riTUXimab (RITUXAN IV) Inject into the vein every 6 (six) months.   Yes [provider]  telmisartan-hydrochlorothiazide (MICARDIS HCT) 80-12.5 MG tablet Take 0.5 tablets by mouth daily.   Yes [provider]  Zinc Oxide (TRIPLE PASTE) 12.8 % ointment Apply 1 application topically daily as needed for irritation.   Yes [provider]  metoprolol succinate (TOPROL-XL) 25 MG 24 hr tablet Take 0.5 tablets (12.5 mg total) by mouth daily. Patient not taking: No sig reported 01/23/17 04/30/19  Geradine Girt, DO  pantoprazole (PROTONIX) 40 MG tablet Take 1 tablet (40 mg total) by mouth daily. 01/24/17 04/30/19  Geradine Girt, DO    Current Facility-Administered Medications  Medication Dose Route Frequency Provider Last Rate Last Admin  . 0.9 %  sodium chloride infusion   Intravenous Continuous [name], MD 20 mL/hr at 12/26/20 1616 New Bag at 12/26/20 1616  . 0.9 %  sodium chloride infusion  250 mL Intravenous PRN Mahli Glahn S, PA-C      . acetaminophen (TYLENOL) tablet 650 mg  650 mg Oral Q6H PRN Braeden Kennan S, PA-C       Or  . acetaminophen (TYLENOL) suppository 650 mg  650 mg Rectal Q6H PRN Mattison Stuckey S, PA-C      . HYDROcodone-acetaminophen (NORCO/VICODIN) 5-325 MG per tablet 1-2 tablet  1-2 tablet Oral Q4H PRN Avina Eberle S, PA-C      . irbesartan (AVAPRO) tablet 150 mg  150 mg Oral Daily Markeda Narvaez S, PA-C   150 mg at 12/26/20 1615  . morphine 2 MG/ML injection 2 mg  2 mg Intravenous Q3H PRN Latessa Tillis S, PA-C      . ondansetron (ZOFRAN) tablet 4 mg  4 mg Oral Q6H PRN Koren Plyler S, PA-C       Or  . ondansetron (ZOFRAN) injection 4 mg  4 mg Intravenous Q6H PRN Sidnee Gambrill S, PA-C      . peg 3350 powder (MOVIPREP) kit 100 g  0.5 kit Oral Once Craigory Toste S, PA-C       And  . peg 3350 powder (MOVIPREP) kit 100 g  0.5 kit Oral Once Adoria Kawamoto S, PA-C      . sodium chloride flush (NS) 0.9 % injection 3 mL  3 mL Intravenous Q12H Brelan Hannen S, PA-C      . sodium chloride flush (NS) 0.9 % injection 3 mL  3 mL Intravenous PRN Felipa Laroche S, PA-C        Allergies as of 12/21/2020  . (No Known Allergies)    Family History  Problem Relation Age of Onset  . Hypertension Father   . Heart disease Father        IHSS   . Hypertension Mother   . Osteoporosis  Mother   . Diabetes Maternal Aunt   . Breast cancer Maternal Aunt     Social History   Socioeconomic History  . Marital status: Married    Spouse name: Larion  . Number of children: 1  . Years of education: 63  . Highest education level: Not on file  Occupational History  . Occupation: retired  . Occupation: disability  Tobacco Use  . Smoking status: Former Smoker    Quit date: 12/25/1988    Years since quitting: 32.0  . Smokeless tobacco: Never Used  Vaping Use  . Vaping Use: Never used  Substance and Sexual Activity  . Alcohol use: No  . Drug use: No  . Sexual activity: Not on file  Other Topics Concern  . Not on file  Social History Narrative   Patient states she has about 4 cups of caffeine daily.   Patient is right handed.    Social Determinants of Health   Financial Resource Strain: Not on file  Food Insecurity: Not on file  Transportation Needs: No Transportation Needs  . Lack of Transportation (Medical): No  . Lack of Transportation (Non-Medical): No  Physical Activity: Not on file  Stress: Not on file  Social Connections: Not on file  Intimate Partner Violence: Not on file    Review of Systems:  All systems reviewed an negative except where noted in HPI.    Physical Exam: Vital signs in last 24 hours: Temp:  [98.2 F (36.8 C)] 98.2 F (36.8 C) (05/02 1330) Pulse Rate:  [104] 104 (05/02 1330) Resp:  [18] 18 (05/02 1330) BP: (180)/(113) 180/113 (05/02 1330) SpO2:  [100 %] 100 % (05/02 1330)   General:  Pleasant, well-developed, older AA female in NAD Head:  Normocephalic and atraumatic. Eyes:  Sclera clear, no icterus.   Conjunctiva pink. Ears:  Normal auditory acuity. Mouth:  No deformity or lesions.  Neck:  Supple; no masses . Lungs:  Clear throughout to auscultation.   No wheezes, crackles, or rhonchi. No acute distress. Heart:  Regular rate and rhythm; no murmurs. Abdomen:  Soft, nondistended, nontender. No masses, hepatomegaly. No  obvious masses.  Normal bowel .    Rectal:  Not done Msk:  Symmetrical without gross deformities.. Pulses:  Normal pulses noted. Extremities:  Without edema. Neurologic:  Alert and  oriented x4;  grossly normal neurologically. Skin:  Intact without significant lesions or rashes. Cervical Nodes:  No significant cervical adenopathy. Psych:  Alert and cooperative. Normal mood and affect.  Lab Results: Recent Labs    12/26/20 1526  WBC 11.6*  HGB 12.5  HCT 41.6  PLT 258   BMET No results for input(s): NA, K, CL, CO2, GLUCOSE, BUN, CREATININE, CALCIUM in the last 72 hours. LFT No results for input(s): PROT, ALBUMIN, AST, ALT, ALKPHOS, BILITOT, BILIDIR, IBILI in the last 72 hours. PT/INR No results for input(s): LABPROT, INR in the last 72 hours. Hepatitis Panel No results for input(s): HEPBSAG, HCVAB, HEPAIGM, HEPBIGM in the last 72 hours.  Studies/Results: No results found.  Impression / Plan:   #32 60 year old African-American female admitted to undergo bowel prep and then colonoscopy secondary to finding of positive Cologuard.  Patient has prior history of colon polyps, last colonoscopy 2014 or 2015 per Dr. Collene Mares  Currently asymptomatic.  Rule out occult colon neoplasm, rule out adenomatous polyps  #2 polymyositis with significant weakness and mobility issues- #3 osteoarthritis #4 hypertension #5 coronary artery disease 6.  Left tibial fracture January 2021  Plan; clear liquid diet this evening, n.p.o. past midnight Movi prep, and Dulcolax Patient is scheduled for colonoscopy with Dr. Loletha Carrow tomorrow morning.  Anticipate discharge home tomorrow afternoon post procedure    LOS: 0 days   Kechia Yahnke PA-C 12/26/2020, 5:04 PM

## 2020-12-27 ENCOUNTER — Observation Stay (HOSPITAL_COMMUNITY): Payer: Medicare HMO | Admitting: Certified Registered Nurse Anesthetist

## 2020-12-27 ENCOUNTER — Encounter (HOSPITAL_COMMUNITY): Payer: Self-pay | Admitting: Gastroenterology

## 2020-12-27 ENCOUNTER — Encounter (HOSPITAL_COMMUNITY)
Admission: RE | Disposition: A | Discharge status: Home / Self Care | Payer: Self-pay | Source: Home / Self Care | Attending: Gastroenterology

## 2020-12-27 DIAGNOSIS — D123 Benign neoplasm of transverse colon: Secondary | ICD-10-CM | POA: Diagnosis not present

## 2020-12-27 DIAGNOSIS — I2510 Atherosclerotic heart disease of native coronary artery without angina pectoris: Secondary | ICD-10-CM | POA: Diagnosis not present

## 2020-12-27 DIAGNOSIS — R195 Other fecal abnormalities: Secondary | ICD-10-CM | POA: Diagnosis not present

## 2020-12-27 DIAGNOSIS — K5730 Diverticulosis of large intestine without perforation or abscess without bleeding: Secondary | ICD-10-CM | POA: Diagnosis not present

## 2020-12-27 DIAGNOSIS — Z7982 Long term (current) use of aspirin: Secondary | ICD-10-CM | POA: Diagnosis not present

## 2020-12-27 DIAGNOSIS — Z87891 Personal history of nicotine dependence: Secondary | ICD-10-CM | POA: Diagnosis not present

## 2020-12-27 DIAGNOSIS — D124 Benign neoplasm of descending colon: Principal | ICD-10-CM

## 2020-12-27 DIAGNOSIS — Z20822 Contact with and (suspected) exposure to covid-19: Secondary | ICD-10-CM | POA: Diagnosis not present

## 2020-12-27 DIAGNOSIS — Z79899 Other long term (current) drug therapy: Secondary | ICD-10-CM | POA: Diagnosis not present

## 2020-12-27 DIAGNOSIS — K635 Polyp of colon: Secondary | ICD-10-CM | POA: Diagnosis not present

## 2020-12-27 DIAGNOSIS — I10 Essential (primary) hypertension: Secondary | ICD-10-CM | POA: Diagnosis not present

## 2020-12-27 HISTORY — PX: HEMOSTASIS CLIP PLACEMENT: SHX6857

## 2020-12-27 HISTORY — PX: POLYPECTOMY: SHX5525

## 2020-12-27 HISTORY — PX: SUBMUCOSAL TATTOO INJECTION: SHX6856

## 2020-12-27 HISTORY — PX: COLONOSCOPY WITH PROPOFOL: SHX5780

## 2020-12-27 LAB — SARS CORONAVIRUS 2 (TAT 6-24 HRS): SARS Coronavirus 2: NEGATIVE

## 2020-12-27 SURGERY — COLONOSCOPY WITH PROPOFOL
Anesthesia: Monitor Anesthesia Care

## 2020-12-27 MED ORDER — PEG-KCL-NACL-NASULF-NA ASC-C 100 G PO SOLR
0.5000 | Freq: Once | ORAL | Status: AC
  Administered 2020-12-27: 100 g via ORAL
  Filled 2020-12-27: qty 1

## 2020-12-27 MED ORDER — EPHEDRINE SULFATE-NACL 50-0.9 MG/10ML-% IV SOSY
PREFILLED_SYRINGE | INTRAVENOUS | Status: DC | PRN
  Administered 2020-12-27 (×2): 5 mg via INTRAVENOUS
  Administered 2020-12-27: 10 mg via INTRAVENOUS

## 2020-12-27 MED ORDER — ONDANSETRON HCL 4 MG/2ML IJ SOLN
INTRAMUSCULAR | Status: DC | PRN
  Administered 2020-12-27: 4 mg via INTRAVENOUS

## 2020-12-27 MED ORDER — PROPOFOL 500 MG/50ML IV EMUL
INTRAVENOUS | Status: DC | PRN
  Administered 2020-12-27: 125 ug/kg/min via INTRAVENOUS

## 2020-12-27 MED ORDER — PROPOFOL 10 MG/ML IV BOLUS
INTRAVENOUS | Status: DC | PRN
  Administered 2020-12-27: 30 mg via INTRAVENOUS

## 2020-12-27 MED ORDER — LACTATED RINGERS IV SOLN: INTRAVENOUS | Status: DC

## 2020-12-27 MED ORDER — PHENYLEPHRINE 40 MCG/ML (10ML) SYRINGE FOR IV PUSH (FOR BLOOD PRESSURE SUPPORT)
PREFILLED_SYRINGE | INTRAVENOUS | Status: DC | PRN
  Administered 2020-12-27: 120 ug via INTRAVENOUS
  Administered 2020-12-27: 160 ug via INTRAVENOUS
  Administered 2020-12-27: 120 ug via INTRAVENOUS
  Administered 2020-12-27: 200 ug via INTRAVENOUS
  Administered 2020-12-27: 160 ug via INTRAVENOUS
  Administered 2020-12-27: 80 ug via INTRAVENOUS
  Administered 2020-12-27: 160 ug via INTRAVENOUS

## 2020-12-27 MED ORDER — SPOT INK MARKER SYRINGE KIT
PACK | SUBMUCOSAL | Status: DC | PRN
  Administered 2020-12-27: 1 mL via SUBMUCOSAL

## 2020-12-27 SURGICAL SUPPLY — 22 items

## 2020-12-27 NOTE — Anesthesia Postprocedure Evaluation (Signed)
Anesthesia Post Note  Patient: Jessica Burke  Procedure(s) Performed: COLONOSCOPY WITH PROPOFOL (N/A ) POLYPECTOMY     Patient location during evaluation: Endoscopy Anesthesia Type: MAC Level of consciousness: awake and alert Pain management: pain level controlled Vital Signs Assessment: post-procedure vital signs reviewed and stable Respiratory status: spontaneous breathing, nonlabored ventilation, respiratory function stable and patient connected to nasal cannula oxygen Cardiovascular status: blood pressure returned to baseline and stable Postop Assessment: no apparent nausea or vomiting Anesthetic complications: no   No complications documented.  Last Vitals:  Vitals:   12/27/20 1240 12/27/20 1250  BP: (!) 110/48 (!) 100/49  Pulse: 90 86  Resp: (!) 30 (!) 27  Temp:    SpO2: 100% 100%    Last Pain:  Vitals:   12/27/20 1250  TempSrc:   PainSc: 0-No pain                 Laquentin Loudermilk L Laurenashley Viar

## 2020-12-27 NOTE — Anesthesia Preprocedure Evaluation (Addendum)
Anesthesia Evaluation  Patient identified by MRN, date of birth, ID band Patient awake    Reviewed: Allergy & Precautions, NPO status , Patient's Chart, lab work & pertinent test results  Airway Mallampati: II  TM Distance: >3 FB Neck ROM: Full    Dental  (+) Missing, Chipped, Poor Dentition, Dental Advisory Given,    Pulmonary shortness of breath, former smoker,    Pulmonary exam normal breath sounds clear to auscultation       Cardiovascular hypertension, Pt. on medications + CAD  Normal cardiovascular exam Rhythm:Regular Rate:Normal  TTE 2018  Normal EF, valves ok   Neuro/Psych PSYCHIATRIC DISORDERS Depression  Neuromuscular disease (h/o muscular dystrophy, polymyositis, can walk a small amount with a walker)    GI/Hepatic Neg liver ROS, GERD  Controlled,  Endo/Other  negative endocrine ROS  Renal/GU negative Renal ROS  negative genitourinary   Musculoskeletal  (+) Arthritis ,   Abdominal   Peds  Hematology negative hematology ROS (+)   Anesthesia Other Findings Colonoscopy for positive cologuard  Reproductive/Obstetrics                            Anesthesia Physical Anesthesia Plan  ASA: III  Anesthesia Plan: MAC   Post-op Pain Management:    Induction: Intravenous  PONV Risk Score and Plan: 2 and Propofol infusion and Treatment may vary due to age or medical condition  Airway Management Planned: Natural Airway  Additional Equipment:   Intra-op Plan:   Post-operative Plan:   Informed Consent: I have reviewed the patients History and Physical, chart, labs and discussed the procedure including the risks, benefits and alternatives for the proposed anesthesia with the patient or authorized representative who has indicated his/her understanding and acceptance.     Dental advisory given  Plan Discussed with: CRNA  Anesthesia Plan Comments:         Anesthesia Quick  Evaluation

## 2020-12-27 NOTE — Progress Notes (Incomplete)
D/C instructions given to patient. Patient had no questions. NT or writer will patient out once her husband brings the car to the front of the building

## 2020-12-27 NOTE — Transfer of Care (Signed)
Immediate Anesthesia Transfer of Care Note  Patient: Jessica Burke  Procedure(s) Performed: COLONOSCOPY WITH PROPOFOL (N/A ) POLYPECTOMY  Patient Location: PACU and Endoscopy Unit  Anesthesia Type:MAC  Level of Consciousness: awake, alert  and oriented  Airway & Oxygen Therapy: Patient Spontanous Breathing and Patient connected to face mask  Post-op Assessment: Report given to RN and Post -op Vital signs reviewed and stable  Post vital signs: Reviewed and stable  Last Vitals:  Vitals Value Taken Time  BP    Temp    Pulse 93 12/27/20 1236  Resp 31 12/27/20 1236  SpO2 100 % 12/27/20 1236  Vitals shown include unvalidated device data.  Last Pain:  Vitals:   12/27/20 1046  TempSrc: Oral  PainSc: 0-No pain      Patients Stated Pain Goal: 1 (03/70/48 8891)  Complications: No complications documented.

## 2020-12-27 NOTE — Progress Notes (Signed)
Patient ID: Jessica Burke, female   DOB: 07/03/1961, 60 y.o.   MRN: [id number]    Progress Note   Subjective   day 2  CC; positive Cologuard  Patient completed prep last night, stool clear brown liquid this a.m., trying to complete 1 more liter of movi prep    Objective   Vital signs in last 24 hours: Temp:  [97.5 F (36.4 C)-98.4 F (36.9 C)] 98.4 F (36.9 C) (05/03 0128) Pulse Rate:  [76-104] 89 (05/03 0128) Resp:  [18] 18 (05/03 0128) BP: (141-180)/(87-113) 141/99 (05/03 0128) SpO2:  [93 %-100 %] 93 % (05/03 0128) Last BM Date: 12/26/20 General:    African-American female in NAD Heart:  Regular rate and rhythm; no murmurs Lungs: Respirations even and unlabored, lungs CTA bilaterally Abdomen:  Soft, nontender and nondistended. Normal bowel sounds. Extremities:  Without edema. Neurologic:  Alert and oriented,  grossly normal neurologically. Psych:  Cooperative. Normal mood and affect.  Intake/Output from previous day: 05/02 0701 - 05/03 0700 In: 634.7 [P.O.:360; I.V.:274.7] Out: 1100 [Urine:1100] Intake/Output this shift: No intake/output data recorded.  Lab Results: Recent Labs    12/26/20 1526  WBC 11.6*  HGB 12.5  HCT 41.6  PLT 258   BMET Recent Labs    12/26/20 1526  NA 142  K 4.2  CL 105  CO2 31  GLUCOSE 97  BUN 9  CREATININE <0.30*  CALCIUM 9.1   LFT No results for input(s): PROT, ALBUMIN, AST, ALT, ALKPHOS, BILITOT, BILIDIR, IBILI in the last 72 hours. PT/INR No results for input(s): LABPROT, INR in the last 72 hours.      Assessment / Plan:    #62 60 year old African-American female admitted for bowel prep and planned colonoscopy in setting of positive Cologuard.  Patient also has prior history of colon polyps  #2 poor mobility secondary to polymyositis #3 hypertension #4 status post left tibial fracture January 2021 5.  Osteoarthritis #6 coronary artery disease  Plan; patient will undergo colonoscopy later today per Dr.  Loletha Burke Anticipate discharge home this afternoon post procedure.       Active Problems:   Encounter for screening colonoscopy     LOS: 0 days   Jessica Burke  12/27/2020, 8:28 AM

## 2020-12-27 NOTE — Op Note (Signed)
[facility] Patient Name: Jessica Burke Procedure Date: 12/27/2020 MRN: [id number] Attending MD: Estill Cotta. Loletha Carrow , MD Date of Birth: 1961/02/23 CSN: [id number] Age: 60 Admit Type: Inpatient Procedure:                Colonoscopy Indications:              Positive Cologuard test Providers:                Mallie Mussel L. Loletha Carrow, MD, Burtis Junes, RN, Tyna Jaksch                            Technician Referring MD:             Deland Pretty, MD Medicines:                Monitored Anesthesia Care Complications:            No immediate complications. Estimated Blood Loss:     Estimated blood loss was minimal. Procedure:                Pre-Anesthesia Assessment:                           - Prior to the procedure, a History and Physical                            was performed, and patient medications and                            allergies were reviewed. The patient's tolerance of                            previous anesthesia was also reviewed. The risks                            and benefits of the procedure and the sedation                            options and risks were discussed with the patient.                            All questions were answered, and informed consent                            was obtained. Prior Anticoagulants: The patient has                            taken no previous anticoagulant or antiplatelet                            agents. ASA Grade Assessment: III - A patient with                            severe systemic disease. After reviewing the risks  and benefits, the patient was deemed in                            satisfactory condition to undergo the procedure.                           After obtaining informed consent, the colonoscope                            was passed under direct vision. Throughout the                            procedure, the patient's blood pressure, pulse, and                            oxygen  saturations were monitored continuously. The                            CF-HQ190L ([id number]) Olympus colonoscope was                            introduced through the anus and advanced to the the                            cecum, identified by appendiceal orifice and                            ileocecal valve. The colonoscopy was somewhat                            difficult due to a redundant colon and significant                            looping. Successful completion of the procedure was                            aided by using manual pressure. The patient                            tolerated the procedure well. The quality of the                            bowel preparation was good except the rectum was                            fair - rectum visualization improved by clearing                            most of the fibrous debris with a retrieval net and                            lavage). The ileocecal valve, appendiceal orifice,  and rectum were photographed. The bowel preparation                            used was MoviPrep via split dose instruction                            (patient was admitted for bowel preparation. rec'd                            ducolax in late afternoon follow by 2 bottles of                            moviprep. insufficient result, so rec'd another                            bottle moviprep AM of procedure). Scope In: 11:41:53 AM Scope Out: 12:23:00 PM Scope Withdrawal Time: 0 hours 34 minutes 38 seconds  Total Procedure Duration: 0 hours 41 minutes 7 seconds  Findings:      The perianal and digital rectal examinations were normal.      - There was a, irregularly-shaped submucosal lesion about 86m in       diameter in the cecum (normal overlying mucosa)      Two sessile polyps were found in the proximal transverse colon. The       polyps were 2 to 4 mm in size. These polyps were removed with a cold       snare. Resection  and retrieval were complete. (Jar 1)      A 6 mm polyp was found in the proximal transverse colon. The polyp was       pedunculated. The polyp was removed with a hot snare. Resection and       retrieval were complete - removed en bloc, then cold cut with snare for       retrieval. (Jar 2)      A 16 mm polyp was found in the proximal descending colon. The polyp was       pedunculated. The polyp was removed with a hot snare. Resection and       retrieval were complete. Area was tattooed with an injection of 1 mL of       Spot (carbon black). To prevent bleeding post-intervention, one       hemostatic clip was successfully placed (MR conditional).      Multiple diverticula were found in the left colon and right colon.      The exam was otherwise without abnormality on direct and retroflexion       views. Impression:               - Two 2 to 4 mm polyps in the proximal transverse                            colon, removed with a cold snare. Resected and                            retrieved.                           - One 6  mm polyp in the proximal transverse colon,                            removed with a hot snare. Resected and retrieved.                           - One 16 mm polyp in the proximal descending colon,                            removed with a hot snare. Resected and retrieved.                            Tattooed. Clip (MR conditional) was placed.                           - Diverticulosis in the left colon and in the right                            colon.                           - The examination was otherwise normal on direct                            and retroflexion views.                           - Submucosal cecal lesion - likely benign Moderate Sedation:      MAC sedation used Recommendation:           - Return patient to hospital ward for discharge                            today.                           - Resume regular diet.                           -  Continue present medications.                           - Await pathology results.                           - Repeat colonoscopy for surveillance based on                            pathology results. For next colonoscopy, patient                            should use miralax twice daily for 2 days before                            prep day, then use 4 liter split-dose PEG prep.  Also, closer attention to pre-procedure dietary                            restrictions regarding fibrous foods. Procedure Code(s):        --- Professional ---                           [id number], Colonoscopy, flexible; with removal of                            tumor(s), polyp(s), or other lesion(s) by snare                            technique                           45381, Colonoscopy, flexible; with directed                            submucosal injection(s), any substance Diagnosis Code(s):        --- Professional ---                           K63.5, Polyp of colon                           R19.5, Other fecal abnormalities                           K57.30, Diverticulosis of large intestine without                            perforation or abscess without bleeding CPT copyright 2019 American Medical Association. All rights reserved. The codes documented in this report are preliminary and upon coder review may  be revised to meet current compliance requirements. Deklan Minar L. Loletha Carrow, MD 12/27/2020 12:38:41 PM This report has been signed electronically. Number of Addenda: 0

## 2020-12-27 NOTE — Interval H&P Note (Signed)
History and Physical Interval Note:  12/27/2020 11:25 AM  Jessica Burke  has presented today for surgery, with the diagnosis of positive cologuard r19.5.  The various methods of treatment have been discussed with the patient and family. After consideration of risks, benefits and other options for treatment, the patient has consented to  Procedure(s): COLONOSCOPY WITH PROPOFOL (N/A) as a surgical intervention.  The patient's history has been reviewed, patient examined, no change in status, stable for surgery.  I have reviewed the patient's chart and labs.  Questions were answered to the patient's satisfaction.     [name]

## 2020-12-27 NOTE — Discharge Instructions (Signed)
Dr. Loletha Carrow will contact you with pathology results.  Please call the office for any problems over the next couple of weeks.  Okay to resume baby aspirin, do not take any anti-inflammatories i.e. Aleve Advil etc.

## 2020-12-27 NOTE — Discharge Summary (Signed)
[facility] Discharge Summary  Name: Jessica Burke MRN: [id number] DOB: [birth year] 60 y.o. PCP:  Deland Pretty, MD  Date of Admission: 12/26/2020  1:18 PM Date of Discharge: 12/27/2020 Attending Physician: Doran Stabler, MD  Discharge Diagnosis: Active Problems:   Encounter for screening colonoscopy Positive Cologuard Colon polyps Small submucosal cecal lesion   Consultations: none  Procedures Performed:  No results found.  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  Patient was admitted on observation for bowel prep and planned colonoscopy which was completed today. Patient found to have 4 colon polyps all of which were removed and a small submucosal lesion in the cecum which was biopsied. She tolerated the procedure well and is discharged home this afternoon.  Path will be followed up as an outpatient and then further recommendations.    Discharge Vitals:  BP 112/74 (BP Location: Right Arm)   Pulse 90   Temp 98.3 F (36.8 C) (Oral)   Resp 15   LMP 11/26/2012   SpO2 97%   Discharge Labs:  Results for orders placed or performed during the hospital encounter of 12/26/20 (from the past 24 hour(s))  Basic metabolic panel     Status: Abnormal   Collection Time: 12/26/20  3:26 PM  Result Value Ref Range   Sodium 142 135 - 145 mmol/L   Potassium 4.2 3.5 - 5.1 mmol/L   Chloride 105 98 - 111 mmol/L   CO2 31 22 - 32 mmol/L   Glucose, Bld 97 70 - 99 mg/dL   BUN 9 8 - 23 mg/dL   Creatinine, Ser <0.30 (L) 0.44 - 1.00 mg/dL   Calcium 9.1 8.9 - 10.3 mg/dL   GFR, Estimated NOT CALCULATED >60 mL/min   Anion gap 6 5 - 15  CBC     Status: Abnormal   Collection Time: 12/26/20  3:26 PM  Result Value Ref Range   WBC 11.6 (H) 4.0 - 10.5 K/uL   RBC 4.48 3.87 -  5.11 MIL/uL   Hemoglobin 12.5 12.0 - 15.0 g/dL   HCT 41.6 36.0 - 46.0 %   MCV 92.9 80.0 - 100.0 fL   MCH 27.9 26.0 - 34.0 pg   MCHC 30.0 30.0 - 36.0 g/dL   RDW 15.0 11.5 - 15.5 %   Platelets 258 150 - 400 K/uL   nRBC 0.0 0.0 - 0.2 %  HIV Antibody (routine testing w rflx)     Status: None   Collection Time: 12/26/20  3:26 PM  Result Value Ref Range   HIV Screen 4th Generation wRfx Non Reactive Non Reactive    Disposition and follow-up:   Jessica Burke was discharged from [facility] in stable condition.    Follow-up Appointments: Discharge Instructions    Diet - low sodium heart healthy   Complete by: As directed    Increase activity slowly   Complete by: As directed       Discharge Medications: Allergies as of 12/27/2020      Reactions   Sertraline Other (See Comments)   Chest pain/ increase blood pressure      Medication List    TAKE these medications   acetaminophen 500 MG tablet Commonly known as: TYLENOL Take 1 tablet (500 mg total) by mouth every 8 (eight) hours. What changed:   when to take this  reasons to take this   Acthar 80 UNIT/ML injectable gel Generic drug: corticotropin Inject 80 Units into the skin once a week. Friday   APPLE CIDER VINEGAR PO Take 2 capsules by mouth daily. Gummies   ascorbic acid 500 MG tablet Commonly known as: VITAMIN C Take 1 tablet (500 mg total) by mouth daily.   Ashwagandha 500 MG Caps Take 1 drop by mouth daily. Gummie / Goli   aspirin EC 81 MG tablet Take 81 mg by mouth daily. Swallow whole.   denosumab 60 MG/ML Sosy injection Commonly known as: PROLIA Inject 60 mg into the skin every 6 (six) months.   multivitamin tablet Take 1 tablet by mouth daily.   RITUXAN IV Inject into the vein every 6 (six) months.   telmisartan-hydrochlorothiazide 80-12.5 MG tablet Commonly known as: MICARDIS HCT Take 0.5 tablets by mouth daily.   Vitamin D 125 MCG (5000 UT) Caps Take 1 capsule by mouth  daily. What changed: how much to take   Zinc Oxide 12.8 % ointment Commonly known as: TRIPLE PASTE Apply 1 application topically daily as needed for irritation.       Signed: Nicoletta Ba 12/27/2020, 3:08 PM

## 2020-12-28 ENCOUNTER — Encounter (HOSPITAL_COMMUNITY): Payer: Self-pay | Admitting: Gastroenterology

## 2021-01-03 ENCOUNTER — Encounter: Payer: Self-pay | Admitting: Gastroenterology

## 2021-01-03 LAB — SURGICAL PATHOLOGY

## 2021-01-12 DIAGNOSIS — M3320 Polymyositis, organ involvement unspecified: Secondary | ICD-10-CM | POA: Diagnosis not present

## 2021-01-16 ENCOUNTER — Encounter (HOSPITAL_COMMUNITY): Payer: Self-pay | Admitting: Emergency Medicine

## 2021-01-16 ENCOUNTER — Emergency Department (HOSPITAL_COMMUNITY)
Admission: EM | Admit: 2021-01-16 | Discharge: 2021-01-16 | Disposition: A | Discharge status: Home / Self Care | Payer: Medicare HMO | Attending: Emergency Medicine | Admitting: Emergency Medicine

## 2021-01-16 ENCOUNTER — Emergency Department (HOSPITAL_COMMUNITY): Payer: Medicare HMO

## 2021-01-16 DIAGNOSIS — R079 Chest pain, unspecified: Secondary | ICD-10-CM | POA: Insufficient documentation

## 2021-01-16 DIAGNOSIS — Z5321 Procedure and treatment not carried out due to patient leaving prior to being seen by health care provider: Secondary | ICD-10-CM | POA: Insufficient documentation

## 2021-01-16 DIAGNOSIS — R21 Rash and other nonspecific skin eruption: Secondary | ICD-10-CM | POA: Diagnosis not present

## 2021-01-16 DIAGNOSIS — R0602 Shortness of breath: Secondary | ICD-10-CM | POA: Insufficient documentation

## 2021-01-16 DIAGNOSIS — Z743 Need for continuous supervision: Secondary | ICD-10-CM | POA: Diagnosis not present

## 2021-01-16 DIAGNOSIS — I491 Atrial premature depolarization: Secondary | ICD-10-CM | POA: Diagnosis not present

## 2021-01-16 DIAGNOSIS — R069 Unspecified abnormalities of breathing: Secondary | ICD-10-CM | POA: Diagnosis not present

## 2021-01-16 LAB — CBC WITH DIFFERENTIAL/PLATELET
Abs Immature Granulocytes: 0.04 10*3/uL (ref 0.00–0.07)
Basophils Absolute: 0 10*3/uL (ref 0.0–0.1)
Basophils Relative: 0 %
Eosinophils Absolute: 0 10*3/uL (ref 0.0–0.5)
Eosinophils Relative: 0 %
HCT: 41.6 % (ref 36.0–46.0)
Hemoglobin: 12.7 g/dL (ref 12.0–15.0)
Immature Granulocytes: 0 %
Lymphocytes Relative: 5 %
Lymphs Abs: 0.5 10*3/uL — ABNORMAL LOW (ref 0.7–4.0)
MCH: 27.5 pg (ref 26.0–34.0)
MCHC: 30.5 g/dL (ref 30.0–36.0)
MCV: 90 fL (ref 80.0–100.0)
Monocytes Absolute: 0 10*3/uL — ABNORMAL LOW (ref 0.1–1.0)
Monocytes Relative: 0 %
Neutro Abs: 10 10*3/uL — ABNORMAL HIGH (ref 1.7–7.7)
Neutrophils Relative %: 95 %
Platelets: 331 10*3/uL (ref 150–400)
RBC: 4.62 MIL/uL (ref 3.87–5.11)
RDW: 15.2 % (ref 11.5–15.5)
WBC: 10.6 10*3/uL — ABNORMAL HIGH (ref 4.0–10.5)
nRBC: 0 % (ref 0.0–0.2)

## 2021-01-16 LAB — COMPREHENSIVE METABOLIC PANEL
ALT: 16 U/L (ref 0–44)
AST: 19 U/L (ref 15–41)
Albumin: 4.1 g/dL (ref 3.5–5.0)
Alkaline Phosphatase: 57 U/L (ref 38–126)
Anion gap: 7 (ref 5–15)
BUN: 13 mg/dL (ref 8–23)
CO2: 27 mmol/L (ref 22–32)
Calcium: 9.2 mg/dL (ref 8.9–10.3)
Chloride: 104 mmol/L (ref 98–111)
Creatinine, Ser: 0.3 mg/dL — ABNORMAL LOW (ref 0.44–1.00)
GFR, Estimated: >60 mL/min (ref >60)
Glucose, Bld: 144 mg/dL — ABNORMAL HIGH (ref 70–99)
Potassium: 3.9 mmol/L (ref 3.5–5.1)
Sodium: 138 mmol/L (ref 135–145)
Total Bilirubin: 0.3 mg/dL (ref 0.3–1.2)
Total Protein: 7.6 g/dL (ref 6.5–8.1)

## 2021-01-16 LAB — TROPONIN I (HIGH SENSITIVITY): Troponin I (High Sensitivity): <2 ng/L (ref <18)

## 2021-01-16 LAB — BRAIN NATRIURETIC PEPTIDE: B Natriuretic Peptide: 18.8 pg/mL (ref 0.0–100.0)

## 2021-01-16 NOTE — ED Provider Notes (Signed)
That isEmergency Medicine Provider Triage Evaluation Note  Jessica Burke 60 y.o. female was evaluated in triage.  Pt complains of shortness of breath.  Patient reports that over the last couple weeks, she has had progressively worsening shortness of breath.  She states she was has some mild shortness of breath but states that she felt like it is gotten worse.  Particularly worse when lying down.  She also reports when she went home today, she went to do her infusion noticed some swelling in her arms which is what caused her to become concerned.  She reports she had some chest pain yesterday but states it has improved.  No fevers, cough.  She states she has weakness in her arms and legs but states that is baseline because of her muscle disease.   Review of Systems  Positive: SOB, weaknes  Negative: Fever, n/v   Physical Exam  BP 134/82   Pulse 70   Temp 98.2 F (36.8 C) (Oral)   Resp 18   Ht 5\' 4"  (1.626 m)   Wt 65.8 kg   SpO2 100%   BMI 24.89 kg/m  Gen:   Awake, no distress   HEENT:  Atraumatic  Resp:  Normal effort.  Able speak in full sentences without any difficulty.  No evidence of respiratory distress. Cardiac:  Normal rate Abd:   Nondistended, nontender  MSK:   Moves extremities without difficulty  Neuro:  Speech clear   Other:      Medical Decision Making  Medically screening exam initiated at 2:43 PM  Appropriate orders placed.  Jessica Burke was informed that the remainder of the evaluation will be completed by another provider, this initial triage assessment does not replace that evaluation. They are counseled that they will need to remain in the ED until the completion of their workup, including full H&P and results of any tests.  Risks of leaving the emergency department prior to completion of treatment were discussed. Patient was advised to inform ED staff if they are leaving before their treatment is complete. The patient acknowledged these risks and time was  allowed for questions.     The patient appears stable so that the remainder of the MSE may be completed by another provider.   Clinical Impression  SOB   Portions of this note were generated with Dragon dictation software. Dictation errors may occur despite best attempts at proofreading.      Volanda Napoleon, PA-C 01/16/21 1445    Lennice Sites, DO 01/16/21 1524

## 2021-01-16 NOTE — ED Notes (Signed)
Pt stated she was leaving and could not wait any longer.

## 2021-01-16 NOTE — ED Triage Notes (Signed)
Per EMS-SOB since this am-dyspnea when lying down-improved when sitting up-97% on RA

## 2021-01-24 DIAGNOSIS — I2510 Atherosclerotic heart disease of native coronary artery without angina pectoris: Secondary | ICD-10-CM | POA: Diagnosis not present

## 2021-01-24 DIAGNOSIS — M810 Age-related osteoporosis without current pathological fracture: Secondary | ICD-10-CM | POA: Diagnosis not present

## 2021-01-24 DIAGNOSIS — I10 Essential (primary) hypertension: Secondary | ICD-10-CM | POA: Diagnosis not present

## 2021-02-06 DIAGNOSIS — M3320 Polymyositis, organ involvement unspecified: Secondary | ICD-10-CM | POA: Diagnosis not present

## 2021-02-16 ENCOUNTER — Other Ambulatory Visit: Payer: Self-pay

## 2021-02-16 ENCOUNTER — Ambulatory Visit
Admission: RE | Admit: 2021-02-16 | Discharge: 2021-02-16 | Disposition: A | Discharge status: Home / Self Care | Payer: Medicare HMO | Source: Ambulatory Visit | Attending: Internal Medicine | Admitting: Internal Medicine

## 2021-02-16 DIAGNOSIS — M8000XA Age-related osteoporosis with current pathological fracture, unspecified site, initial encounter for fracture: Secondary | ICD-10-CM

## 2021-02-16 DIAGNOSIS — Z780 Asymptomatic menopausal state: Secondary | ICD-10-CM | POA: Diagnosis not present

## 2021-02-16 DIAGNOSIS — M85832 Other specified disorders of bone density and structure, left forearm: Secondary | ICD-10-CM | POA: Diagnosis not present

## 2021-02-23 DIAGNOSIS — M810 Age-related osteoporosis without current pathological fracture: Secondary | ICD-10-CM | POA: Diagnosis not present

## 2021-02-23 DIAGNOSIS — I10 Essential (primary) hypertension: Secondary | ICD-10-CM | POA: Diagnosis not present

## 2021-02-23 DIAGNOSIS — I2510 Atherosclerotic heart disease of native coronary artery without angina pectoris: Secondary | ICD-10-CM | POA: Diagnosis not present

## 2021-03-02 ENCOUNTER — Other Ambulatory Visit: Payer: Self-pay

## 2021-03-02 ENCOUNTER — Telehealth: Payer: Self-pay | Admitting: Obstetrics and Gynecology

## 2021-03-02 ENCOUNTER — Other Ambulatory Visit (HOSPITAL_COMMUNITY)
Admission: RE | Admit: 2021-03-02 | Discharge: 2021-03-02 | Disposition: A | Discharge status: Home / Self Care | Payer: Medicare HMO | Source: Ambulatory Visit | Attending: Obstetrics and Gynecology | Admitting: Obstetrics and Gynecology

## 2021-03-02 ENCOUNTER — Encounter: Payer: Self-pay | Admitting: Obstetrics and Gynecology

## 2021-03-02 ENCOUNTER — Ambulatory Visit (INDEPENDENT_AMBULATORY_CARE_PROVIDER_SITE_OTHER): Payer: Medicare HMO | Admitting: Obstetrics and Gynecology

## 2021-03-02 VITALS — BP 180/100 | HR 101

## 2021-03-02 DIAGNOSIS — N852 Hypertrophy of uterus: Secondary | ICD-10-CM

## 2021-03-02 DIAGNOSIS — Z01411 Encounter for gynecological examination (general) (routine) with abnormal findings: Secondary | ICD-10-CM

## 2021-03-02 DIAGNOSIS — Z124 Encounter for screening for malignant neoplasm of cervix: Secondary | ICD-10-CM

## 2021-03-02 DIAGNOSIS — Z86018 Personal history of other benign neoplasm: Secondary | ICD-10-CM | POA: Diagnosis not present

## 2021-03-02 DIAGNOSIS — R32 Unspecified urinary incontinence: Secondary | ICD-10-CM | POA: Diagnosis not present

## 2021-03-02 DIAGNOSIS — R159 Full incontinence of feces: Secondary | ICD-10-CM

## 2021-03-02 DIAGNOSIS — R2233 Localized swelling, mass and lump, upper limb, bilateral: Secondary | ICD-10-CM

## 2021-03-02 DIAGNOSIS — Z008 Encounter for other general examination: Secondary | ICD-10-CM

## 2021-03-02 DIAGNOSIS — Z1239 Encounter for other screening for malignant neoplasm of breast: Secondary | ICD-10-CM

## 2021-03-02 NOTE — Progress Notes (Signed)
GYNECOLOGY  VISIT   HPI: 60 y.o.   Married  [place] American  female   G37P1 with Patient's last menstrual period was 11/26/2012.   here for Breast and pelvic exam.  Her husband is present for the visit today.   Urinary incontinence and fecal incontinence.  Uses Depends. Does Kegel's.  Hx pressure ulcer and still has it.   Denies vaginal bleeding.   States she monitors her blood pressure at home. Can see elevations at doctor's appointments.  Difficulty with ambulation.  Using a wheel chair.   GYNECOLOGIC HISTORY: Patient's last menstrual period was 11/26/2012. Contraception:  PMP Menopausal hormone therapy:  none Last mammogram: 06-22-20 Neg/Birads1 Last pap smear: 2018 normal per patient. 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[place])    Closed fracture of left proximal tibia 09/21/2019   CAP (community acquired pneumonia)    Polymyositis ([place]) 08/03/2015   Acute respiratory failure ([place]) 11/21/2014   Acute hypoxemic respiratory failure ([place]) 11/20/2014   Influenza A (H1N1) 11/20/2014   Autoimmune disease ([place]) 11/20/2014   Chest pain 03/17/2013   Leukocytosis 03/17/2013   Anemia 03/17/2013   Hypokalemia 03/17/2013   HTN (hypertension) 03/17/2013    Past Medical History:  Diagnosis Date   Anemia    Arthritis    CAD (coronary artery disease)    Chest pain 12/2016   Colon polyps    Depression    2003   GERD (gastroesophageal reflux disease)    H/O corticosteroid therapy 09/23/2019   History of abnormal cervical Pap smear    and LEEP   Hyperlipidemia    Hypertension    MD  (muscular dystrophy) ([place])    Osteoporosis    Pneumonia    2016   Polymyositis ([place]) 12/2016   Pre-diabetes    Shortness of breath dyspnea    Vitamin D deficiency 09/24/2019    Past Surgical History:  Procedure Laterality Date   CESAREAN SECTION     COLONOSCOPY W/ BIOPSIES     COLONOSCOPY WITH PROPOFOL N/A 12/27/2020   Procedure: COLONOSCOPY WITH PROPOFOL;  Surgeon: Doran Stabler, MD;  Location: [facility];  Service: Gastroenterology;  Laterality: N/A;   ENDOMETRIAL ABLATION     HEMOSTASIS CLIP PLACEMENT  12/27/2020   Procedure: HEMOSTASIS CLIP PLACEMENT;  Surgeon: Doran Stabler, MD;  Location: [facility];  Service: Gastroenterology;;   LEEP  2006   ORIF TIBIA FRACTURE Left 09/22/2019   Procedure: OPEN REDUCTION INTERNAL FIXATION (ORIF) PROXIMAL TIBIA FRACTURE;  Surgeon: Altamese Ashley, MD;  Location: [place] [facility];  Service: Orthopedics;  Laterality: Left;   POLYPECTOMY  12/27/2020   Procedure: POLYPECTOMY;  Surgeon: Doran Stabler, MD;  Location: [facility];  Service: Gastroenterology;;   SUBMUCOSAL TATTOO INJECTION  12/27/2020  Procedure: SUBMUCOSAL TATTOO INJECTION;  Surgeon: Doran Stabler, MD;  Location: [facility];  Service: Gastroenterology;;   UTERINE ARTERY EMBOLIZATION  05/19/2008    Current Outpatient Medications  Medication Sig Dispense Refill   acetaminophen (TYLENOL) 500 MG tablet Take 1 tablet (500 mg total) by mouth every 8 (eight) hours. (Patient taking differently: Take 500 mg by mouth every 8 (eight) hours as needed for mild pain or moderate pain.) 60 tablet 0   ACTHAR 80 UNIT/ML injectable gel Inject 80 Units into the skin once a week. Friday     APPLE CIDER VINEGAR PO Take 2 capsules by mouth daily. Gummies     ascorbic acid (VITAMIN C) 500 MG tablet Take 1 tablet (500 mg total) by mouth daily. 30 tablet 1   Ashwagandha 500 MG CAPS Take 1 drop by mouth daily. Gummie / Goli     aspirin EC 81 MG tablet Take 81 mg by mouth daily. Swallow  whole.     Cholecalciferol (VITAMIN D) 125 MCG (5000 UT) CAPS Take 1 capsule by mouth daily. (Patient taking differently: Take 5,000 Units by mouth daily.) 30 capsule 3   denosumab (PROLIA) 60 MG/ML SOSY injection Inject 60 mg into the skin every 6 (six) months.     Multiple Vitamin (MULTIVITAMIN) tablet Take 1 tablet by mouth daily.     riTUXimab (RITUXAN IV) Inject into the vein every 6 (six) months.     telmisartan-hydrochlorothiazide (MICARDIS HCT) 80-12.5 MG tablet Take 0.5 tablets by mouth daily.     Zinc Oxide (TRIPLE PASTE) 12.8 % ointment Apply 1 application topically daily as needed for irritation.     No current facility-administered medications for this visit.     ALLERGIES: Sertraline  Family History  Problem Relation Age of Onset   Hypertension Father    Heart disease Father        IHSS    Hypertension Mother    Osteoporosis Mother    Diabetes Maternal Aunt    Breast cancer Maternal Aunt     Social History   Socioeconomic History   Marital status: Married    Spouse name: Larion   Number of children: 1   Years of education: 14   Highest education level: Not on file  Occupational History   Occupation: retired   Occupation: disability  Tobacco Use   Smoking status: Former    Pack years: 0.00    Types: Cigarettes    Quit date: 12/25/1988    Years since quitting: 32.2   Smokeless tobacco: Never  Vaping Use   Vaping Use: Never used  Substance and Sexual Activity   Alcohol use: No   Drug use: No   Sexual activity: Not Currently    Birth control/protection: Post-menopausal  Other Topics Concern   Not on file  Social History Narrative   Patient states she has about 4 cups of caffeine daily.   Patient is right handed.    Social Determinants of Health   Financial Resource Strain: Not on file  Food Insecurity: Not on file  Transportation Needs: Not on file  Physical Activity: Not on file  Stress: Not on file  Social Connections: Not on file  Intimate  Partner Violence: Not on file    Review of Systems  All other systems reviewed and are negative.  PHYSICAL EXAMINATION:    BP (!) 180/100   Pulse (!) 101   LMP 11/26/2012   SpO2 100%     General appearance: alert, cooperative and appears  stated age Head: Normocephalic, without obvious abnormality, atraumatic Neck: no adenopathy, supple, symmetrical, trachea midline and thyroid normal to inspection and palpation Lungs: clear to auscultation bilaterally Breasts: normal appearance, no masses or tenderness, No nipple retraction or dimpling, No nipple discharge or bleeding, No supraclavicular adenopathy. Multiple small bilateral axillary masses.  Heart: regular rate and rhythm Abdomen: soft, non-tender, no masses,  no organomegaly Extremities: extremities normal, atraumatic, no cyanosis or edema Lymph nodes: Cervical, supraclavicular, and axillary nodes normal. No abnormal inguinal nodes palpated  Pelvic: External genitalia:  no lesions              Urethra:  normal appearing urethra with no masses, tenderness or lesions              Bartholins and Skenes: normal                 Vagina: normal appearing vagina with normal color and discharge, no lesions              Cervix: no lesions.  Pap, yes.                 Bimanual Exam:  Uterus:  12 - 13 week size.               Adnexa: no mass, fullness, tenderness              Rectal exam: yes.  Confirms.              Anus:  normal sphincter tone, no lesions  Chaperone was present for exam.  ASSESSMENT  Pelvic exam with abnormal finding present. Uterine enlargement. Status post uterine artery embolization for fibroids on chart review 2009.  Uterus was 20 week size from MRI report.  Cervical cancer screening.  Hx LEEP. Screening breast exam.  Bilateral axillary masses.  Encounter for rectal exam.  Urinary and fecal incontinence. Elevated blood pressure reading.  Polymyositis.  Muscular dystrophy.  PLAN  Return for pelvic  ultrasound - transabdominal and transvaginal.  Pap with reflux HR HPV. Dx bilateral mammogram and [place] of axillary regions.  We reviewed different incontinence products and layering of these with personal pads inside of pull ups.  She will recheck her blood pressure at home and call Dr. Pennie Banter office if it remains elevated.   50 min total time was spent for this patient encounter, including preparation, face-to-face counseling with the patient, coordination of care, and documentation of the encounter.

## 2021-03-02 NOTE — Patient Instructions (Signed)

## 2021-03-02 NOTE — Telephone Encounter (Signed)
Please schedule a diagnostic bilateral mammogram and bilateral axillary ultrasounds at the [facility] for bilateral axillary masses.

## 2021-03-05 ENCOUNTER — Encounter: Payer: Self-pay | Admitting: Obstetrics and Gynecology

## 2021-03-06 NOTE — Telephone Encounter (Signed)
Patient scheduled at the breast center on 03/07/21 @ 2:20pm.  Patient informed with time and date, number given to call and schedule.

## 2021-03-07 ENCOUNTER — Ambulatory Visit
Admission: RE | Admit: 2021-03-07 | Discharge: 2021-03-07 | Disposition: A | Discharge status: Home / Self Care | Payer: Medicare HMO | Source: Ambulatory Visit | Attending: Obstetrics and Gynecology | Admitting: Obstetrics and Gynecology

## 2021-03-07 ENCOUNTER — Other Ambulatory Visit: Payer: Self-pay

## 2021-03-07 ENCOUNTER — Other Ambulatory Visit: Payer: Self-pay | Admitting: Obstetrics and Gynecology

## 2021-03-07 DIAGNOSIS — R2233 Localized swelling, mass and lump, upper limb, bilateral: Secondary | ICD-10-CM

## 2021-03-07 DIAGNOSIS — N6011 Diffuse cystic mastopathy of right breast: Secondary | ICD-10-CM | POA: Diagnosis not present

## 2021-03-07 DIAGNOSIS — N6012 Diffuse cystic mastopathy of left breast: Secondary | ICD-10-CM | POA: Diagnosis not present

## 2021-03-07 DIAGNOSIS — R922 Inconclusive mammogram: Secondary | ICD-10-CM | POA: Diagnosis not present

## 2021-03-08 LAB — CYTOLOGY - PAP
Adequacy: ABSENT
Diagnosis: NEGATIVE

## 2021-03-26 DIAGNOSIS — M810 Age-related osteoporosis without current pathological fracture: Secondary | ICD-10-CM | POA: Diagnosis not present

## 2021-03-26 DIAGNOSIS — I10 Essential (primary) hypertension: Secondary | ICD-10-CM | POA: Diagnosis not present

## 2021-03-26 DIAGNOSIS — I2510 Atherosclerotic heart disease of native coronary artery without angina pectoris: Secondary | ICD-10-CM | POA: Diagnosis not present

## 2021-04-26 DIAGNOSIS — I2510 Atherosclerotic heart disease of native coronary artery without angina pectoris: Secondary | ICD-10-CM | POA: Diagnosis not present

## 2021-04-26 DIAGNOSIS — M810 Age-related osteoporosis without current pathological fracture: Secondary | ICD-10-CM | POA: Diagnosis not present

## 2021-04-26 DIAGNOSIS — I10 Essential (primary) hypertension: Secondary | ICD-10-CM | POA: Diagnosis not present

## 2021-04-27 ENCOUNTER — Other Ambulatory Visit: Payer: Self-pay

## 2021-04-27 ENCOUNTER — Ambulatory Visit: Payer: Medicare HMO | Admitting: Obstetrics and Gynecology

## 2021-04-27 ENCOUNTER — Encounter: Payer: Self-pay | Admitting: Obstetrics and Gynecology

## 2021-04-27 ENCOUNTER — Ambulatory Visit (INDEPENDENT_AMBULATORY_CARE_PROVIDER_SITE_OTHER): Payer: Medicare HMO

## 2021-04-27 VITALS — BP 144/90 | HR 88

## 2021-04-27 DIAGNOSIS — Z86018 Personal history of other benign neoplasm: Secondary | ICD-10-CM

## 2021-04-27 DIAGNOSIS — Z9889 Other specified postprocedural states: Secondary | ICD-10-CM | POA: Diagnosis not present

## 2021-04-27 DIAGNOSIS — N852 Hypertrophy of uterus: Secondary | ICD-10-CM

## 2021-04-27 DIAGNOSIS — D259 Leiomyoma of uterus, unspecified: Secondary | ICD-10-CM | POA: Diagnosis not present

## 2021-04-27 NOTE — Progress Notes (Signed)
GYNECOLOGY  VISIT   HPI: 60 y.o.   Married  [place] American  female   G1P1 with Patient's last menstrual period was 11/26/2012.   here for pelvic ultrasound due to uterine enlargement noted on pelvic exam in July.  Husband is present for the visit today.  Patient has a history of uterine artery embolization for a 20 week size uterus in 2009.    GYNECOLOGIC HISTORY: Patient's last menstrual period was 11/26/2012. Contraception:  PMP Menopausal hormone therapy:  none Last mammogram: 06-22-20 Neg/BIRADS1.    Dx bilateral mammogram and bilateral US 03/07/21 - BI-RADS2 - sebaceous cysts of bilateral axillary regions, no evidence of malignancy or enlarged axillary nodes.  Last pap smear: 03-02-21 Neg, 2018 normal per patient. 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[place])    Closed fracture of left proximal tibia 09/21/2019   CAP (community acquired pneumonia)    Polymyositis ([place]) 08/03/2015   Acute respiratory failure ([place]) 11/21/2014   Acute hypoxemic respiratory failure ([place]) 11/20/2014   Influenza A (H1N1) 11/20/2014   Autoimmune disease ([place]) 11/20/2014   Chest pain 03/17/2013   Leukocytosis 03/17/2013   Anemia 03/17/2013   Hypokalemia 03/17/2013   HTN (hypertension) 03/17/2013    Past Medical History:  Diagnosis Date   Anemia    Arthritis    CAD (coronary artery disease)    Chest pain 12/2016   Colon polyps    Depression    2003   GERD (gastroesophageal reflux disease)    H/O corticosteroid therapy 09/23/2019   History of abnormal cervical Pap smear    and LEEP   Hyperlipidemia     Hypertension    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[place])    Osteoporosis    Pneumonia    2016   Polymyositis ([place]) 12/2016   Pre-diabetes    Shortness of breath dyspnea    Vitamin D deficiency 09/24/2019    Past Surgical History:  Procedure Laterality Date   CESAREAN SECTION     COLONOSCOPY W/ BIOPSIES     COLONOSCOPY WITH PROPOFOL N/A 12/27/2020   Procedure: COLONOSCOPY WITH PROPOFOL;  Surgeon: Doran Stabler, MD;  [facility];  Service: Gastroenterology;  Laterality: N/A;   ENDOMETRIAL ABLATION     HEMOSTASIS CLIP PLACEMENT  12/27/2020   Procedure: HEMOSTASIS CLIP PLACEMENT;  Surgeon: Doran Stabler, MD;  [facility];  Service: Gastroenterology;;   LEEP  2006   ORIF TIBIA FRACTURE Left 09/22/2019   Procedure: OPEN REDUCTION INTERNAL FIXATION (ORIF) PROXIMAL TIBIA FRACTURE;  Surgeon: Altamese Tenstrike, MD;  Location: [facility];  Service: Orthopedics;  Laterality: Left;   POLYPECTOMY  12/27/2020   Procedure: POLYPECTOMY;  Surgeon: Doran Stabler, MD;  [facility];  Service: Gastroenterology;;   Reva Bores  INJECTION  12/27/2020   Procedure: SUBMUCOSAL TATTOO INJECTION;  Surgeon: Doran Stabler, MD;  [facility];  Service: Gastroenterology;;   UTERINE ARTERY EMBOLIZATION  05/19/2008    Current Outpatient Medications  Medication Sig Dispense Refill   acetaminophen (TYLENOL) 500 MG tablet Take 1 tablet (500 mg total) by mouth every 8 (eight) hours. (Patient taking differently: Take 500 mg by mouth every 8 (eight) hours as needed for mild pain or moderate pain.) 60 tablet 0   ACTHAR 80 UNIT/ML injectable gel Inject 80 Units into the skin once a week. Friday     APPLE CIDER VINEGAR PO Take 2 capsules by mouth daily. Gummies     ascorbic acid (VITAMIN C) 500 MG tablet Take 1 tablet (500 mg total) by mouth daily. 30 tablet 1   Ashwagandha 500 MG CAPS Take 1 drop by mouth daily. Gummie / Goli     aspirin EC 81 MG tablet Take 81 mg by mouth  daily. Swallow whole.     Cholecalciferol (VITAMIN D) 125 MCG (5000 UT) CAPS Take 1 capsule by mouth daily. (Patient taking differently: Take 5,000 Units by mouth daily.) 30 capsule 3   denosumab (PROLIA) 60 MG/ML SOSY injection Inject 60 mg into the skin every 6 (six) months.     Multiple Vitamin (MULTIVITAMIN) tablet Take 1 tablet by mouth daily.     riTUXimab (RITUXAN IV) Inject into the vein every 6 (six) months.     telmisartan-hydrochlorothiazide (MICARDIS HCT) 40-12.5 MG tablet 1/2 tablet     telmisartan-hydrochlorothiazide (MICARDIS HCT) 80-12.5 MG tablet Take 0.5 tablets by mouth daily.     Zinc Oxide (TRIPLE PASTE) 12.8 % ointment Apply 1 application topically daily as needed for irritation.     No current facility-administered medications for this visit.     ALLERGIES: Sertraline  Family History  Problem Relation Age of Onset   Hypertension Father    Heart disease Father        IHSS    Hypertension Mother    Osteoporosis Mother    Diabetes Maternal Aunt    Breast cancer Maternal Aunt     Social History   Socioeconomic History   Marital status: Married    Spouse name: Larion   Number of children: 1   Years of education: 14   Highest education level: Not on file  Occupational History   Occupation: retired   Occupation: disability  Tobacco Use   Smoking status: Former    Types: Cigarettes    Quit date: 12/25/1988    Years since quitting: 32.3   Smokeless tobacco: Never  Vaping Use   Vaping Use: Never used  Substance and Sexual Activity   Alcohol use: No   Drug use: No   Sexual activity: Not Currently    Birth control/protection: Post-menopausal  Other Topics Concern   Not on file  Social History Narrative   Patient states she has about 4 cups of caffeine daily.   Patient is right handed.    Social Determinants of Health   Financial Resource Strain: Not on file  Food Insecurity: Not on file  Transportation Needs: Not on file  Physical Activity: Not on  file  Stress: Not on file  Social Connections: Not on file  Intimate Partner Violence: Not on file    Review of Systems  All other systems reviewed and are negative.  PHYSICAL EXAMINATION:    BP (!) 144/90   Pulse 88   LMP 11/26/2012  General appearance: alert, cooperative and appears stated age   Pelvic US - transabdominal and transvaginal Uterus 8.61 x 8.25 x 4.34 cm. One fibroid 3.05 cm.   Inhomogeneous uterine pattern. EMS difficult to see.  Left ovary normal.  Right ovary not seen.  No free fluid.   ASSESSMENT  Status post uterine artery embolization.  Fibroid uterus.  Much decreased in size.  PLAN  Prior uterine artery embolization report and post procedure MRI reviewed.  Ultrasound findings and images noted.  Call for any vaginal bleeding.  Fu in 2 years and prn.    An After Visit Summary was printed and given to the patient.

## 2021-05-05 DIAGNOSIS — Z23 Encounter for immunization: Secondary | ICD-10-CM | POA: Diagnosis not present

## 2021-05-26 DIAGNOSIS — M810 Age-related osteoporosis without current pathological fracture: Secondary | ICD-10-CM | POA: Diagnosis not present

## 2021-05-26 DIAGNOSIS — I2510 Atherosclerotic heart disease of native coronary artery without angina pectoris: Secondary | ICD-10-CM | POA: Diagnosis not present

## 2021-05-26 DIAGNOSIS — I10 Essential (primary) hypertension: Secondary | ICD-10-CM | POA: Diagnosis not present

## 2021-06-30 ENCOUNTER — Encounter (HOSPITAL_COMMUNITY): Payer: Self-pay

## 2021-06-30 ENCOUNTER — Emergency Department (HOSPITAL_COMMUNITY)
Admission: EM | Admit: 2021-06-30 | Discharge: 2021-06-30 | Disposition: A | Discharge status: Home / Self Care | Payer: Medicare HMO | Attending: Emergency Medicine | Admitting: Emergency Medicine

## 2021-06-30 ENCOUNTER — Emergency Department (HOSPITAL_COMMUNITY): Payer: Medicare HMO

## 2021-06-30 DIAGNOSIS — I2510 Atherosclerotic heart disease of native coronary artery without angina pectoris: Secondary | ICD-10-CM | POA: Insufficient documentation

## 2021-06-30 DIAGNOSIS — R0600 Dyspnea, unspecified: Secondary | ICD-10-CM | POA: Diagnosis not present

## 2021-06-30 DIAGNOSIS — R0789 Other chest pain: Secondary | ICD-10-CM | POA: Diagnosis not present

## 2021-06-30 DIAGNOSIS — Z7982 Long term (current) use of aspirin: Secondary | ICD-10-CM | POA: Insufficient documentation

## 2021-06-30 DIAGNOSIS — R0602 Shortness of breath: Secondary | ICD-10-CM | POA: Insufficient documentation

## 2021-06-30 DIAGNOSIS — Z79899 Other long term (current) drug therapy: Secondary | ICD-10-CM | POA: Insufficient documentation

## 2021-06-30 DIAGNOSIS — I10 Essential (primary) hypertension: Secondary | ICD-10-CM | POA: Diagnosis not present

## 2021-06-30 DIAGNOSIS — R079 Chest pain, unspecified: Secondary | ICD-10-CM | POA: Diagnosis not present

## 2021-06-30 DIAGNOSIS — Z87891 Personal history of nicotine dependence: Secondary | ICD-10-CM | POA: Diagnosis not present

## 2021-06-30 LAB — BASIC METABOLIC PANEL
Anion gap: 9 (ref 5–15)
BUN: 9 mg/dL (ref 8–23)
CO2: 27 mmol/L (ref 22–32)
Calcium: 9.1 mg/dL (ref 8.9–10.3)
Chloride: 103 mmol/L (ref 98–111)
Creatinine, Ser: <0.3 mg/dL — ABNORMAL LOW (ref 0.44–1.00)
Glucose, Bld: 88 mg/dL (ref 70–99)
Potassium: 3.9 mmol/L (ref 3.5–5.1)
Sodium: 139 mmol/L (ref 135–145)

## 2021-06-30 LAB — CBC
HCT: 40.2 % (ref 36.0–46.0)
Hemoglobin: 12.2 g/dL (ref 12.0–15.0)
MCH: 27.2 pg (ref 26.0–34.0)
MCHC: 30.3 g/dL (ref 30.0–36.0)
MCV: 89.7 fL (ref 80.0–100.0)
Platelets: 207 10*3/uL (ref 150–400)
RBC: 4.48 MIL/uL (ref 3.87–5.11)
RDW: 15.2 % (ref 11.5–15.5)
WBC: 8.2 10*3/uL (ref 4.0–10.5)
nRBC: 0 % (ref 0.0–0.2)

## 2021-06-30 LAB — TROPONIN I (HIGH SENSITIVITY)
Troponin I (High Sensitivity): 3 ng/L (ref <18)
Troponin I (High Sensitivity): 2 ng/L (ref <18)

## 2021-06-30 LAB — D-DIMER, QUANTITATIVE: D-Dimer, Quant: 0.4 ug/mL-FEU (ref 0.00–0.50)

## 2021-06-30 NOTE — ED Provider Notes (Signed)
[facility] DEPT Provider Note   CSN: [id number] Arrival date & time: 06/30/21  0118     History Chief Complaint  Patient presents with   Shortness of Breath    Jessica Burke is a 60 y.o. female.  The history is provided by the patient, the spouse and medical records.  Shortness of Breath Jessica Burke is a 60 y.o. female who presents to the Emergency Department complaining of chest pain.  She presents to the ED accompanied by her husband for waxing and waning left sided chest pain that is described as a tightness.  Pain is located on the left side and radiates to her jaw and back.  Pain waxes and wanes, episodes last about 10-15 minutes.  Pain sometimes improves with repositioning.  Has associated sob.    No fever, cough, diaphoresis.  She did have upset stomache.  No nausea.  Had some swelling to the right ankle.      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   Pre-diabetes    Shortness of breath dyspnea    Vitamin D deficiency 09/24/2019    Patient Active Problem List   Diagnosis Date Noted   Encounter for screening colonoscopy 12/26/2020   Vitamin D deficiency 09/24/2019   H/O corticosteroid therapy 09/23/2019   Osteoporosis    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[place])    Closed fracture of left proximal tibia 09/21/2019   CAP (community acquired pneumonia)    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   Acute respiratory failure ([place]) 11/21/2014   Acute hypoxemic respiratory failure ([place]) 11/20/2014   Influenza A (H1N1) 11/20/2014   Autoimmune disease ([place]) 11/20/2014    Chest pain 03/17/2013   Leukocytosis 03/17/2013   Anemia 03/17/2013   Hypokalemia 03/17/2013   HTN (hypertension) 03/17/2013    Past Surgical History:  Procedure Laterality Date   CESAREAN SECTION     COLONOSCOPY W/ BIOPSIES     COLONOSCOPY WITH PROPOFOL N/A 12/27/2020   Procedure: COLONOSCOPY WITH PROPOFOL;  Surgeon: Doran Stabler, MD;  Location: [facility];  Service: Gastroenterology;  Laterality: N/A;   ENDOMETRIAL ABLATION     HEMOSTASIS CLIP PLACEMENT  12/27/2020   Procedure: HEMOSTASIS CLIP PLACEMENT;  Surgeon: Doran Stabler, MD;  Location: [facility];  Service: Gastroenterology;;   LEEP  2006   ORIF TIBIA FRACTURE Left 09/22/2019   Procedure: OPEN REDUCTION INTERNAL FIXATION (ORIF) PROXIMAL TIBIA FRACTURE;  Surgeon: Altamese Turton, MD;  Location: [facility];  Service: Orthopedics;  Laterality: Left;   POLYPECTOMY  12/27/2020   Procedure: POLYPECTOMY;  Surgeon: Doran Stabler, MD;  Location: [facility];  Service: Gastroenterology;;   SUBMUCOSAL TATTOO INJECTION  12/27/2020   Procedure: SUBMUCOSAL TATTOO INJECTION;  Surgeon: Doran Stabler, MD;  [facility]: [facility];  Service: Gastroenterology;;   UTERINE ARTERY EMBOLIZATION  05/19/2008     OB History     Gravida  1   Para  1   Term  Preterm      AB      Living         SAB      IAB      Ectopic      Multiple      Live Births              Family History  Problem Relation Age of Onset   Hypertension Father    Heart disease Father        IHSS    Hypertension Mother    Osteoporosis Mother    Diabetes Maternal Aunt    Breast cancer Maternal Aunt     Social History   Tobacco Use   Smoking status: Former    Types: Cigarettes    Quit date: 12/25/1988    Years since quitting: 32.5   Smokeless tobacco: Never  Vaping Use   Vaping Use: Never used  Substance Use Topics   Alcohol use: No   Drug use: No    Home Medications Prior to Admission medications   Medication  Sig Start Date End Date Taking? Authorizing Provider  acetaminophen (TYLENOL) 500 MG tablet Take 1 tablet (500 mg total) by mouth every 8 (eight) hours. Patient taking differently: Take 500 mg by mouth every 8 (eight) hours as needed for mild pain or moderate pain. 09/25/19   Ainsley Spinner, PA-C  ACTHAR 80 UNIT/ML injectable gel Inject 80 Units into the skin once a week. Friday 04/20/19   [provider]  APPLE CIDER VINEGAR PO Take 2 capsules by mouth daily. Gummies    [provider]  ascorbic acid (VITAMIN C) 500 MG tablet Take 1 tablet (500 mg total) by mouth daily. 09/26/19   Ainsley Spinner, PA-C  Ashwagandha 500 MG CAPS Take 1 drop by mouth daily. Gummie / Goli    [provider]  aspirin EC 81 MG tablet Take 81 mg by mouth daily. Swallow whole.    [provider]  Cholecalciferol (VITAMIN D) 125 MCG (5000 UT) CAPS Take 1 capsule by mouth daily. Patient taking differently: Take 5,000 Units by mouth daily. 09/25/19   Ainsley Spinner, PA-C  denosumab (PROLIA) 60 MG/ML SOSY injection Inject 60 mg into the skin every 6 (six) months.    [provider]  Multiple Vitamin (MULTIVITAMIN) tablet Take 1 tablet by mouth daily.    [provider]  riTUXimab (RITUXAN IV) Inject into the vein every 6 (six) months.    [provider]  telmisartan-hydrochlorothiazide (MICARDIS HCT) 40-12.5 MG tablet 1/2 tablet 12/05/20   [provider]  telmisartan-hydrochlorothiazide (MICARDIS HCT) 80-12.5 MG tablet Take 0.5 tablets by mouth daily.    [provider]  Zinc Oxide (TRIPLE PASTE) 12.8 % ointment Apply 1 application topically daily as needed for irritation.    [provider]  metoprolol succinate (TOPROL-XL) 25 MG 24 hr tablet Take 0.5 tablets (12.5 mg total) by mouth daily. Patient not taking: No sig reported 01/23/17 04/30/19  Geradine Girt, DO  pantoprazole (PROTONIX) 40 MG tablet Take 1 tablet (40 mg total) by mouth daily. 01/24/17  04/30/19  Geradine Girt, DO    Allergies    Sertraline  Review of Systems   Review of Systems  Respiratory:  Positive for shortness of breath.   All other systems reviewed and are negative.  Physical Exam Updated Vital Signs BP 123/88   Pulse 68   Temp 97.8 F (36.6 C) (Oral)   Resp (!) 24   Ht 5'  3" (1.6 m)   Wt 70.3 kg   LMP 11/26/2012   SpO2 99%   BMI 27.46 kg/m   Physical Exam Vitals and nursing note reviewed.  Constitutional:      Appearance: She is well-developed.  HENT:     Head: Normocephalic and atraumatic.  Cardiovascular:     Rate and Rhythm: Normal rate and regular rhythm.     Heart sounds: No murmur heard. Pulmonary:     Effort: Pulmonary effort is normal. No respiratory distress.     Breath sounds: Normal breath sounds.  Abdominal:     Palpations: Abdomen is soft.     Tenderness: There is no abdominal tenderness. There is no guarding or rebound.  Musculoskeletal:        General: No swelling or tenderness.     Comments: 2+ Dp pulses bilaterally  Skin:    General: Skin is warm and dry.  Neurological:     Mental Status: She is alert and oriented to person, place, and time.  Psychiatric:        Behavior: Behavior normal.    ED Results / Procedures / Treatments   Labs (all labs ordered are listed, but only abnormal results are displayed) Labs Reviewed  BASIC METABOLIC PANEL - Abnormal; Notable for the following components:      Result Value   Creatinine, Ser <0.30 (*)    All other components within normal limits  CBC  D-DIMER, QUANTITATIVE  TROPONIN I (HIGH SENSITIVITY)  TROPONIN I (HIGH SENSITIVITY)    EKG EKG Interpretation  Date/Time:  Friday June 30 2021 05:31:37 EDT Ventricular Rate:  66 PR Interval:  177 QRS Duration: 98 QT Interval:  404 QTC Calculation: 424 R Axis:   16 Text Interpretation: Sinus rhythm Confirmed by Quintella Reichert [phone]) on 06/30/2021 5:41:37 AM  Radiology DG Chest 2 View  Result Date:  06/30/2021 CLINICAL DATA:  Dyspnea, mid chest pain EXAM: CHEST - 2 VIEW COMPARISON:  01/16/2021 FINDINGS: Lungs volumes are small, but are symmetric and are clear. No pneumothorax or pleural effusion. Cardiac size within normal limits. Pulmonary vascularity is normal. Osseous structures are age-appropriate. No acute bone abnormality. IMPRESSION: No active cardiopulmonary disease. Electronically Signed   By: Fidela Salisbury M.D.   On: 06/30/2021 02:21    Procedures Procedures   Medications Ordered in ED Medications - No data to display  ED Course  I have reviewed the triage vital signs and the nursing notes.  Pertinent labs & imaging results that were available during my care of the patient were reviewed by me and considered in my medical decision making (see chart for details).    MDM Rules/Calculators/A&P                          patient with history of polymyositis here for evaluation of left-sided chest pain, shortness of breath. Patient without significant symptoms on ED assessment. EKG is without acute ischemic changes in troponin's are negative times two. Chest x-ray is negative for acute pneumonia. Presentation is not consistent with PE, D dimer is negative. Discussed with patient unclear source of her pain, feels she is stable for discharge home with over-the-counter NSAIDs as needed for pain. Discussed return precautions.  Final Clinical Impression(s) / ED Diagnoses Final diagnoses:  Atypical chest pain    Rx / DC Orders ED Discharge Orders     None        Quintella Reichert, MD 06/30/21 [phone]

## 2021-06-30 NOTE — ED Triage Notes (Signed)
Patient arrives from home with complaint of SOB and left sided chest pain that radiates through the jaw bone and left side of neck ongoing x 2 days. Pt reports taking 2 81 mg aspirin around 9 pm.

## 2021-07-19 DIAGNOSIS — M3320 Polymyositis, organ involvement unspecified: Secondary | ICD-10-CM | POA: Diagnosis not present

## 2021-07-26 DIAGNOSIS — I2510 Atherosclerotic heart disease of native coronary artery without angina pectoris: Secondary | ICD-10-CM | POA: Diagnosis not present

## 2021-07-26 DIAGNOSIS — I10 Essential (primary) hypertension: Secondary | ICD-10-CM | POA: Diagnosis not present

## 2021-07-26 DIAGNOSIS — M810 Age-related osteoporosis without current pathological fracture: Secondary | ICD-10-CM | POA: Diagnosis not present

## 2021-08-03 DIAGNOSIS — M3320 Polymyositis, organ involvement unspecified: Secondary | ICD-10-CM | POA: Diagnosis not present

## 2021-09-25 DIAGNOSIS — I10 Essential (primary) hypertension: Secondary | ICD-10-CM | POA: Diagnosis not present

## 2021-09-25 DIAGNOSIS — E7800 Pure hypercholesterolemia, unspecified: Secondary | ICD-10-CM | POA: Diagnosis not present

## 2021-09-26 DIAGNOSIS — I10 Essential (primary) hypertension: Secondary | ICD-10-CM | POA: Diagnosis not present

## 2021-09-26 DIAGNOSIS — I2510 Atherosclerotic heart disease of native coronary artery without angina pectoris: Secondary | ICD-10-CM | POA: Diagnosis not present

## 2021-09-26 DIAGNOSIS — M810 Age-related osteoporosis without current pathological fracture: Secondary | ICD-10-CM | POA: Diagnosis not present

## 2021-09-28 DIAGNOSIS — I2510 Atherosclerotic heart disease of native coronary artery without angina pectoris: Secondary | ICD-10-CM | POA: Diagnosis not present

## 2021-09-28 DIAGNOSIS — D849 Immunodeficiency, unspecified: Secondary | ICD-10-CM | POA: Diagnosis not present

## 2021-09-28 DIAGNOSIS — I10 Essential (primary) hypertension: Secondary | ICD-10-CM | POA: Diagnosis not present

## 2021-09-28 DIAGNOSIS — M3320 Polymyositis, organ involvement unspecified: Secondary | ICD-10-CM | POA: Diagnosis not present

## 2021-09-28 DIAGNOSIS — M810 Age-related osteoporosis without current pathological fracture: Secondary | ICD-10-CM | POA: Diagnosis not present

## 2021-09-28 DIAGNOSIS — Z0000 Encounter for general adult medical examination without abnormal findings: Secondary | ICD-10-CM | POA: Diagnosis not present

## 2021-09-28 DIAGNOSIS — E7800 Pure hypercholesterolemia, unspecified: Secondary | ICD-10-CM | POA: Diagnosis not present

## 2021-09-29 ENCOUNTER — Inpatient Hospital Stay: Payer: Medicare HMO

## 2021-09-29 ENCOUNTER — Encounter: Payer: Self-pay | Admitting: Student

## 2021-09-29 ENCOUNTER — Ambulatory Visit: Payer: Medicare HMO | Admitting: Student

## 2021-09-29 VITALS — BP 150/95 | HR 116 | Temp 98.4°F | Resp 17 | Ht 63.5 in | Wt 142.2 lb

## 2021-09-29 DIAGNOSIS — I10 Essential (primary) hypertension: Secondary | ICD-10-CM | POA: Diagnosis not present

## 2021-09-29 DIAGNOSIS — I491 Atrial premature depolarization: Secondary | ICD-10-CM

## 2021-09-29 DIAGNOSIS — E7800 Pure hypercholesterolemia, unspecified: Secondary | ICD-10-CM | POA: Diagnosis not present

## 2021-09-29 DIAGNOSIS — R000 Tachycardia, unspecified: Secondary | ICD-10-CM | POA: Diagnosis not present

## 2021-09-29 DIAGNOSIS — I4891 Unspecified atrial fibrillation: Secondary | ICD-10-CM | POA: Diagnosis not present

## 2021-09-29 NOTE — Progress Notes (Signed)
Primary Physician/Referring:  Deland Pretty, MD  Patient ID: Jessica Burke, female    DOB: 1960/10/10, 61 y.o.   MRN: [id number]  Chief Complaint  Patient presents with   Atrial Fibrillation   New Patient (Initial Visit)   HPI:    Jessica Burke  is a 61 y.o. AA female with history of hypertension, hyperlipidemia, prediabetes, polymyositis (follows with rheumatology), osteoporosis, GERD. Has been on chronic steroids in starting in 1990s due to polymyositis.  Patient also has history of anxiety, particularly she reports when at medical offices.  Patient is referred to our office for evaluation of irregular heartbeat at PCPs office yesterday.  Patient was being seen for regular follow-up and EKG was reported as concerning for A. Fib therefore patient was sent to our office for urgent evaluation.  Notably she is enrolled in RPM with PCPs office for blood pressure and heart rate monitoring.  Patient brings a log with her to today's office visit which shows blood pressure at home is well controlled and heart rate averages 80-90 bpm.  Patient is essentially asymptomatic, denies palpitations, chest pain, dyspnea.  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   Pre-diabetes    Shortness of breath dyspnea    Vitamin D deficiency 09/24/2019   Past Surgical History:  Procedure Laterality Date   CESAREAN SECTION     COLONOSCOPY W/ BIOPSIES     COLONOSCOPY WITH PROPOFOL N/A 12/27/2020   Procedure: COLONOSCOPY WITH PROPOFOL;  Surgeon: Doran Stabler, MD;  [facility];  Service:  Gastroenterology;  Laterality: N/A;   ENDOMETRIAL ABLATION     HEMOSTASIS CLIP PLACEMENT  12/27/2020   Procedure: HEMOSTASIS CLIP PLACEMENT;  Surgeon: Doran Stabler, MD;  [facility];  Service: Gastroenterology;;   LEEP  2006   ORIF TIBIA FRACTURE Left 09/22/2019   Procedure: OPEN REDUCTION INTERNAL FIXATION (ORIF) PROXIMAL TIBIA FRACTURE;  Surgeon: Altamese Penuelas, MD;  Location: [facility];  Service: Orthopedics;  Laterality: Left;   POLYPECTOMY  12/27/2020   Procedure: POLYPECTOMY;  Surgeon: Doran Stabler, MD;  [facility];  Service: Gastroenterology;;   SUBMUCOSAL TATTOO INJECTION  12/27/2020   Procedure: SUBMUCOSAL TATTOO INJECTION;  Surgeon: Doran Stabler, MD;  [facility];  Service: Gastroenterology;;   UTERINE ARTERY EMBOLIZATION  05/19/2008   Family History  Problem Relation Age of Onset   Hypertension Father    Heart disease Father        IHSS    Hypertension Mother    Osteoporosis Mother    Diabetes Maternal Aunt    Breast cancer Maternal Aunt     Social History   Tobacco Use   Smoking status: Former    Packs/day: 0.25    Years: 5.00    Pack years: 1.25    Types: Cigarettes    Quit date: 12/25/1988    Years since quitting: 32.7   Smokeless tobacco: Never  Substance Use Topics   Alcohol use: No  Marital Status: Married   ROS  Review of Systems  Cardiovascular:  Negative for chest pain, claudication, leg swelling, near-syncope, orthopnea, palpitations, paroxysmal nocturnal dyspnea and syncope.  Respiratory:  Negative for shortness of breath.   Neurological:  Negative for dizziness.   Objective  Blood pressure (!) 150/95, pulse (!) 116, temperature 98.4 F (36.9 C), temperature source Temporal, resp. rate 17, height 5' 3.5" (1.613 m), weight 142 lb 3.2 oz (64.5 kg), last menstrual period 11/26/2012, SpO2 98 %.  Vitals with BMI 09/29/2021 06/30/2021 06/30/2021  Height 5' 3.5" - -  Weight 142 lbs 3 oz - -  BMI 73.22 - -   Systolic 025 427 062  Diastolic 95 77 75  Pulse 376 59 66      Physical Exam Vitals reviewed.  Constitutional:      Comments: Wheelchair-bound  Cardiovascular:     Rate and Rhythm: Regular rhythm. Tachycardia present. Occasional Extrasystoles are present.    Pulses: Intact distal pulses.          Carotid pulses are 2+ on the right side and 2+ on the left side.      Radial pulses are 2+ on the right side and 2+ on the left side.       Dorsalis pedis pulses are 1+ on the right side and 1+ on the left side.       Posterior tibial pulses are 1+ on the right side and 1+ on the left side.     Heart sounds: S1 normal and S2 normal. No murmur heard.   No gallop.  Pulmonary:     Effort: Pulmonary effort is normal. No respiratory distress.     Breath sounds: No wheezing, rhonchi or rales.  Musculoskeletal:     Right lower leg: No edema.     Left lower leg: No edema.  Neurological:     Mental Status: She is alert.    Laboratory examination:   Recent Labs    12/26/20 1526 01/16/21 1451 06/30/21 0247  NA 142 138 139  K 4.2 3.9 3.9  CL 105 104 103  CO2 31 27 27   GLUCOSE 97 144* 88  BUN 9 13 9   CREATININE <0.30* 0.30* <0.30*  CALCIUM 9.1 9.2 9.1  GFRNONAA NOT CALCULATED >60 NOT CALCULATED   CrCl cannot be calculated (Patient's most recent lab result is older than the maximum 21 days allowed.).  CMP Latest Ref Rng & Units 06/30/2021 01/16/2021 12/26/2020  Glucose 70 - 99 mg/dL 88 144(H) 97  BUN 8 - 23 mg/dL 9 13 9   Creatinine 0.44 - 1.00 mg/dL <0.30(L) 0.30(L) <0.30(L)  Sodium 135 - 145 mmol/L 139 138 142  Potassium 3.5 - 5.1 mmol/L 3.9 3.9 4.2  Chloride 98 - 111 mmol/L 103 104 105  CO2 22 - 32 mmol/L 27 27 31   Calcium 8.9 - 10.3 mg/dL 9.1 9.2 9.1  Total Protein 6.5 - 8.1 g/dL - 7.6 -  Total Bilirubin 0.3 - 1.2 mg/dL - 0.3 -  Alkaline Phos 38 - 126 U/L - 57 -  AST 15 - 41 U/L - 19 -  ALT 0 - 44 U/L - 16 -   CBC Latest Ref Rng & Units 06/30/2021 01/16/2021 12/26/2020  WBC 4.0 -  10.5 K/uL 8.2 10.6(H) 11.6(H)  Hemoglobin 12.0 - 15.0 g/dL 12.2 12.7 12.5  Hematocrit 36.0 - 46.0 % 40.2 41.6 41.6  Platelets 150 - 400 K/uL 207 331 258    Lipid Panel No results for input(s): CHOL, TRIG, LDLCALC,  VLDL, HDL, CHOLHDL, LDLDIRECT in the last 8760 hours.  HEMOGLOBIN A1C Lab Results  Component Value Date   HGBA1C 5.7 (H) 01/22/2017   MPG 117 01/22/2017   TSH No results for input(s): TSH in the last 8760 hours.  External labs:   09/25/2021: Total cholesterol 171, HDL 46, LDL 112, triglycerides 64 Hgb 12.9, HCT 41.9, platelet 241 BUN 11, creatinine 0.32, potassium 4.4, sodium 141, GFR >60  Allergies   Allergies  Allergen Reactions   Sertraline Other (See Comments)    Chest pain/ increase blood pressure Other reaction(s): Unknown    Medications Prior to Visit:   Outpatient Medications Prior to Visit  Medication Sig Dispense Refill   acetaminophen (TYLENOL) 500 MG tablet Take 1 tablet (500 mg total) by mouth every 8 (eight) hours. (Patient taking differently: Take 500 mg by mouth every 8 (eight) hours as needed for mild pain or moderate pain.) 60 tablet 0   ACTHAR 80 UNIT/ML injectable gel Inject 80 Units into the skin once a week. Friday     APPLE CIDER VINEGAR PO Take 2 capsules by mouth daily. Gummies     ascorbic acid (VITAMIN C) 500 MG tablet Take 1 tablet (500 mg total) by mouth daily. 30 tablet 1   Cholecalciferol (VITAMIN D) 125 MCG (5000 UT) CAPS Take 1 capsule by mouth daily. (Patient taking differently: Take 5,000 Units by mouth daily.) 30 capsule 3   Multiple Vitamin (MULTIVITAMIN) tablet Take 1 tablet by mouth daily.     Omega-3 Fatty Acids (OMEGA-3 2100 PO)      riTUXimab (RITUXAN IV) Inject into the vein every 6 (six) months.     telmisartan-hydrochlorothiazide (MICARDIS HCT) 40-12.5 MG tablet Take 1 tablet by mouth daily.     Turmeric POWD See admin instructions.     Zinc Oxide (TRIPLE PASTE) 12.8 % ointment Apply 1 application topically daily as  needed for irritation.     denosumab (PROLIA) 60 MG/ML SOSY injection Inject 60 mg into the skin every 6 (six) months.     Ashwagandha 500 MG CAPS Take 1 drop by mouth daily. Gummie / Goli     aspirin EC 81 MG tablet Take 81 mg by mouth daily. Swallow whole.     telmisartan-hydrochlorothiazide (MICARDIS HCT) 80-12.5 MG tablet Take 0.5 tablets by mouth daily.     No facility-administered medications prior to visit.   Final Medications at End of Visit    Current Meds  Medication Sig   acetaminophen (TYLENOL) 500 MG tablet Take 1 tablet (500 mg total) by mouth every 8 (eight) hours. (Patient taking differently: Take 500 mg by mouth every 8 (eight) hours as needed for mild pain or moderate pain.)   ACTHAR 80 UNIT/ML injectable gel Inject 80 Units into the skin once a week. Friday   APPLE CIDER VINEGAR PO Take 2 capsules by mouth daily. Gummies   ascorbic acid (VITAMIN C) 500 MG tablet Take 1 tablet (500 mg total) by mouth daily.   Cholecalciferol (VITAMIN D) 125 MCG (5000 UT) CAPS Take 1 capsule by mouth daily. (Patient taking differently: Take 5,000 Units by mouth daily.)   Multiple Vitamin (MULTIVITAMIN) tablet Take 1 tablet by mouth daily.   Omega-3 Fatty Acids (OMEGA-3 2100 PO)    riTUXimab (RITUXAN IV) Inject into the vein every 6 (six) months.   telmisartan-hydrochlorothiazide (MICARDIS HCT) 40-12.5 MG tablet Take 1 tablet by mouth daily.   Turmeric POWD See admin instructions.   Zinc Oxide (TRIPLE PASTE) 12.8 % ointment Apply 1  application topically daily as needed for irritation.   Radiology:   No results found.  Cardiac Studies:  Echocardiogram 01/22/2022:  Left ventricle: The cavity size was normal. Wall thickness was   increased in a pattern of mild LVH. Systolic function was normal.   The estimated ejection fraction was in the range of 60% to 65%.   Wall motion was normal; there were no regional wall motion    abnormalities. Doppler parameters are consistent with abnormal   left  ventricular relaxation (grade 1 diastolic dysfunction). The   E/e&' ratio is <8, suggesting normal LV filling pressure.  -Aortic valve: Trileaflet. Sclerosis without stenosis. There was   trivial regurgitation.  - Mitral valve: Mildly thickened leaflets . There was trivial   regurgitation.  - Left atrium: The atrium was normal in size.  - Right atrium: The atrium was normal in size.  - Tricuspid valve: There was mild regurgitation.  - Pulmonary arteries: PA peak pressure: 45 mm Hg (S).  - Inferior vena cava: The vessel was normal in size. The   respirophasic diameter changes were in the normal range (= 50%),   consistent with normal central venous pressure.   Chemical stress test 01/22/2017: 1. No reversible ischemia or infarction. 2. Normal left ventricular wall motion. 3. Left ventricular ejection fraction 73% 4. Non invasive risk stratification: Low  EKG:   External EKG 09/28/2021: Sinus tachycardia with PACs at a rate of 125 bpm.  09/29/2021: Sinus tachycardia with frequent PACs at a rate of 107 bpm.  Left axis.  LVH with likely secondary ST-T wave abnormalities.  Assessment     ICD-10-CM   1. PAC (premature atrial contraction)  I49.1 EKG 12-Lead    LONG TERM MONITOR (3-14 DAYS)    2. Sinus tachycardia  R00.0 EKG 12-Lead    LONG TERM MONITOR (3-14 DAYS)    3. Essential hypertension  I10     4. Hypercholesterolemia  E78.00        Medications Discontinued During This Encounter  Medication Reason   telmisartan-hydrochlorothiazide (MICARDIS HCT) 80-12.5 MG tablet    aspirin EC 81 MG tablet    Ashwagandha 500 MG CAPS     No orders of the defined types were placed in this encounter.   Recommendations:   Jessica Burke is a 61 y.o. AA female with history of hypertension, hyperlipidemia, prediabetes, polymyositis (follows with rheumatology), osteoporosis, GERD. Has been on chronic steroids in starting in 1990s due to polymyositis.  Patient also has history of anxiety,  particularly she reports when at medical offices.  Patient was originally referred for evaluation of "irregular heartbeat".  I personally reviewed PCPs EKG which shows no evidence of atrial fibrillation, but rather sinus tachycardia with PACs.  I also reviewed external labs, lipids are uncontrolled however given history of polymyositis PCP and patient have opted to hold off on statin therapy at this time.  Notably patient is very resistant to additional medications or medication changes.  She is otherwise fairly asymptomatic from a cardiovascular standpoint.  Patient's blood pressure is well controlled on home monitoring, however it is elevated in the office today.  I suspect a component of whitecoat hypertension.  Will defer further management of both hyperlipidemia and hypertension to PCP.  Patient does have evidence of frequent PACs, therefore shared decision was to obtain 2-week cardiac telemetry to evaluate PAC burden as well as to evaluate for underlying significant arrhythmias.  Could consider restarting metoprolol pending results of cardiac monitoring.  Also discussed with patient  option to proceed with echocardiogram and stress test, however patient wishes to hold off at this time which is reasonable.  Further recommendations pending results of cardiac testing.  During this visit I reviewed and updated: Tobacco history   allergies  medication reconciliation   medical history   surgical history   family history   social history.  This note was created using a voice recognition software as a result there may be grammatical errors inadvertently enclosed that do not reflect the nature of this encounter. Every attempt is made to correct such errors.   Alethia Berthold, PA-C 09/29/2021, 3:18 PM Office: [phone]

## 2021-10-09 DIAGNOSIS — R000 Tachycardia, unspecified: Secondary | ICD-10-CM | POA: Diagnosis not present

## 2021-10-09 DIAGNOSIS — I491 Atrial premature depolarization: Secondary | ICD-10-CM | POA: Diagnosis not present

## 2021-10-20 DIAGNOSIS — I491 Atrial premature depolarization: Secondary | ICD-10-CM | POA: Diagnosis not present

## 2021-10-20 DIAGNOSIS — R000 Tachycardia, unspecified: Secondary | ICD-10-CM | POA: Diagnosis not present

## 2021-10-26 NOTE — Progress Notes (Unsigned)
Primary Physician/Referring:  Deland Pretty, MD  Patient ID: Jessica Burke, female    DOB: [birth year], 61 y.o.   MRN: [id number]  No chief complaint on file.  HPI:    Jessica Burke  is a 61 y.o. AA female with history of hypertension, hyperlipidemia, prediabetes, polymyositis (follows with rheumatology), osteoporosis, GERD. Has been on chronic steroids in starting in 1990s due to polymyositis.  Patient also has history of anxiety, particularly she reports when at medical offices.  Patient was originally referred to our office by PCP for evaluation of irregular heartbeat.  At last office visit EKG revealed frequent PACs, therefore shared decision was to obtain 2-week cardiac monitor to evaluate for underlying arrhythmias as well as PAC burden.  Also last office visit discussed options of proceeding with echocardiogram, stress test, as well as beta-blocker therapy however patient wished to hold off on these.  2-week ambulatory telemetry revealed 2 episodes of SVT as well as frequent PVCs with PVC burden of 5.4%. ***  ***  Patient is referred to our office for evaluation of irregular heartbeat at PCPs office yesterday.  Patient was being seen for regular follow-up and EKG was reported as concerning for A. Fib therefore patient was sent to our office for urgent evaluation.  Notably she is enrolled in RPM with PCPs office for blood pressure and heart rate monitoring.  Patient brings a log with her to today's office visit which shows blood pressure at home is well controlled and heart rate averages 80-90 bpm.  Patient is essentially asymptomatic, denies palpitations, chest pain, dyspnea.  Past Medical History:  Diagnosis Date   Anemia    Arthritis    CAD (coronary artery disease)    Chest pain 12/2016   Colon polyps    Depression    2003   GERD (gastroesophageal reflux disease)    H/O corticosteroid therapy 09/23/2019   History of abnormal cervical Pap smear    and LEEP    Hyperlipidemia    Hypertension    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[place])    Osteoporosis    Pneumonia    2016   Polymyositis ([place]) 12/2016   Pre-diabetes    Shortness of breath dyspnea    Vitamin D deficiency 09/24/2019   Past Surgical History:  Procedure Laterality Date   CESAREAN SECTION     COLONOSCOPY W/ BIOPSIES     COLONOSCOPY WITH PROPOFOL N/A 12/27/2020   Procedure: COLONOSCOPY WITH PROPOFOL;  Surgeon: Doran Stabler, MD;  Location: [facility];  Service: Gastroenterology;  Laterality: N/A;   ENDOMETRIAL ABLATION     HEMOSTASIS CLIP PLACEMENT  12/27/2020   Procedure: HEMOSTASIS CLIP PLACEMENT;  Surgeon: Doran Stabler, MD;  Location: [facility];  Service: Gastroenterology;;   LEEP  2006   ORIF TIBIA FRACTURE Left 09/22/2019   Procedure: OPEN REDUCTION INTERNAL FIXATION (ORIF) PROXIMAL TIBIA FRACTURE;  Surgeon: Altamese Grovetown, MD;  Location: [facility];  Service: Orthopedics;  Laterality: Left;   POLYPECTOMY  12/27/2020   Procedure: POLYPECTOMY;  Surgeon: Doran Stabler, MD;  Location: [facility];  Service: Gastroenterology;;   SUBMUCOSAL TATTOO INJECTION  12/27/2020   Procedure: SUBMUCOSAL TATTOO INJECTION;  Surgeon: Doran Stabler, MD;  Location: [facility];  Service: Gastroenterology;;   UTERINE ARTERY EMBOLIZATION  05/19/2008   Family History  Problem Relation Age of Onset   Hypertension Father    Heart disease Father        IHSS    Hypertension Mother  Osteoporosis Mother    Diabetes Maternal Aunt    Breast cancer Maternal Aunt     Social History   Tobacco Use   Smoking status: Former    Packs/day: 0.25    Years: 5.00    Pack years: 1.25    Types: Cigarettes    Quit date: 12/25/1988    Years since quitting: 32.8   Smokeless tobacco: Never  Substance Use Topics   Alcohol use: No   Marital Status: Married   ROS  Review of Systems  Cardiovascular:  Negative for chest pain, claudication, leg swelling, near-syncope, orthopnea, palpitations,  paroxysmal nocturnal dyspnea and syncope.  Respiratory:  Negative for shortness of breath.   Neurological:  Negative for dizziness.   Objective  Last menstrual period 11/26/2012.  Vitals with BMI 09/29/2021 06/30/2021 06/30/2021  Height 5' 3.5" - -  Weight 142 lbs 3 oz - -  BMI 41.66 - -  Systolic 063 016 010  Diastolic 95 77 75  Pulse 932 59 66      Physical Exam Vitals reviewed.  Constitutional:      Comments: Wheelchair-bound  Cardiovascular:     Rate and Rhythm: Regular rhythm. Tachycardia present. Occasional Extrasystoles are present.    Pulses: Intact distal pulses.          Carotid pulses are 2+ on the right side and 2+ on the left side.      Radial pulses are 2+ on the right side and 2+ on the left side.       Dorsalis pedis pulses are 1+ on the right side and 1+ on the left side.       Posterior tibial pulses are 1+ on the right side and 1+ on the left side.     Heart sounds: S1 normal and S2 normal. No murmur heard.   No gallop.  Pulmonary:     Effort: Pulmonary effort is normal. No respiratory distress.     Breath sounds: No wheezing, rhonchi or rales.  Musculoskeletal:     Right lower leg: No edema.     Left lower leg: No edema.  Neurological:     Mental Status: She is alert.    Laboratory examination:   Recent Labs    12/26/20 1526 01/16/21 1451 06/30/21 0247  NA 142 138 139  K 4.2 3.9 3.9  CL 105 104 103  CO2 31 27 27   GLUCOSE 97 144* 88  BUN 9 13 9   CREATININE <0.30* 0.30* <0.30*  CALCIUM 9.1 9.2 9.1  GFRNONAA NOT CALCULATED >60 NOT CALCULATED    CrCl cannot be calculated (Patient's most recent lab result is older than the maximum 21 days allowed.).  CMP Latest Ref Rng & Units 06/30/2021 01/16/2021 12/26/2020  Glucose 70 - 99 mg/dL 88 144(H) 97  BUN 8 - 23 mg/dL 9 13 9   Creatinine 0.44 - 1.00 mg/dL <0.30(L) 0.30(L) <0.30(L)  Sodium 135 - 145 mmol/L 139 138 142  Potassium 3.5 - 5.1 mmol/L 3.9 3.9 4.2  Chloride 98 - 111 mmol/L 103 104 105  CO2 22 -  32 mmol/L 27 27 31   Calcium 8.9 - 10.3 mg/dL 9.1 9.2 9.1  Total Protein 6.5 - 8.1 g/dL - 7.6 -  Total Bilirubin 0.3 - 1.2 mg/dL - 0.3 -  Alkaline Phos 38 - 126 U/L - 57 -  AST 15 - 41 U/L - 19 -  ALT 0 - 44 U/L - 16 -   CBC Latest Ref Rng & Units 06/30/2021 01/16/2021 12/26/2020  WBC 4.0 - 10.5 K/uL 8.2 10.6(H) 11.6(H)  Hemoglobin 12.0 - 15.0 g/dL 12.2 12.7 12.5  Hematocrit 36.0 - 46.0 % 40.2 41.6 41.6  Platelets 150 - 400 K/uL 207 331 258    Lipid Panel No results for input(s): CHOL, TRIG, LDLCALC, VLDL, HDL, CHOLHDL, LDLDIRECT in the last 8760 hours.  HEMOGLOBIN A1C Lab Results  Component Value Date   HGBA1C 5.7 (H) 01/22/2017   MPG 117 01/22/2017   TSH No results for input(s): TSH in the last 8760 hours.  External labs:   09/25/2021: Total cholesterol 171, HDL 46, LDL 112, triglycerides 64 Hgb 12.9, HCT 41.9, platelet 241 BUN 11, creatinine 0.32, potassium 4.4, sodium 141, GFR >60  Allergies   Allergies  Allergen Reactions   Sertraline Other (See Comments)    Chest pain/ increase blood pressure Other reaction(s): Unknown    Medications Prior to Visit:   Outpatient Medications Prior to Visit  Medication Sig Dispense Refill   acetaminophen (TYLENOL) 500 MG tablet Take 1 tablet (500 mg total) by mouth every 8 (eight) hours. (Patient taking differently: Take 500 mg by mouth every 8 (eight) hours as needed for mild pain or moderate pain.) 60 tablet 0   ACTHAR 80 UNIT/ML injectable gel Inject 80 Units into the skin once a week. Friday     APPLE CIDER VINEGAR PO Take 2 capsules by mouth daily. Gummies     ascorbic acid (VITAMIN C) 500 MG tablet Take 1 tablet (500 mg total) by mouth daily. 30 tablet 1   Cholecalciferol (VITAMIN D) 125 MCG (5000 UT) CAPS Take 1 capsule by mouth daily. (Patient taking differently: Take 5,000 Units by mouth daily.) 30 capsule 3   denosumab (PROLIA) 60 MG/ML SOSY injection Inject 60 mg into the skin every 6 (six) months.     Multiple Vitamin  (MULTIVITAMIN) tablet Take 1 tablet by mouth daily.     Omega-3 Fatty Acids (OMEGA-3 2100 PO)      riTUXimab (RITUXAN IV) Inject into the vein every 6 (six) months.     telmisartan-hydrochlorothiazide (MICARDIS HCT) 40-12.5 MG tablet Take 1 tablet by mouth daily.     Turmeric POWD See admin instructions.     Zinc Oxide (TRIPLE PASTE) 12.8 % ointment Apply 1 application topically daily as needed for irritation.     No facility-administered medications prior to visit.   Final Medications at End of Visit    No outpatient medications have been marked as taking for the 10/27/21 encounter (Appointment) with Rayetta Pigg, Danira Nylander C, PA-C.   Radiology:   No results found.  Cardiac Studies:  Ambulatory cardiac telemetry 3 days (09/29/2021 - 10/03/2021): Predominant underlying rhythm was sinus with 2 episodes of supraventricular tachycardia, longest lasting 9 beats with a maximum heart rate of 197 bpm.  Rare PVCs, frequent PACs with 5.4% burden.  There were no patient triggered events.  No evidence of significant cardiac arrhythmias including atrial fibrillation, VT, high degree AV block, or pauses >3 seconds.  Echocardiogram 01/22/2022:  Left ventricle: The cavity size was normal. Wall thickness was   increased in a pattern of mild LVH. Systolic function was normal.   The estimated ejection fraction was in the range of 60% to 65%.   Wall motion was normal; there were no regional wall motion    abnormalities. Doppler parameters are consistent with abnormal   left ventricular relaxation (grade 1 diastolic dysfunction). The   E/e&' ratio is <8, suggesting normal LV filling pressure.  -Aortic valve: Trileaflet. Sclerosis without stenosis.  There was   trivial regurgitation.  - Mitral valve: Mildly thickened leaflets . There was trivial   regurgitation.  - Left atrium: The atrium was normal in size.  - Right atrium: The atrium was normal in size.  - Tricuspid valve: There was mild regurgitation.  - Pulmonary  arteries: PA peak pressure: 45 mm Hg (S).  - Inferior vena cava: The vessel was normal in size. The   respirophasic diameter changes were in the normal range (= 50%),   consistent with normal central venous pressure.   Chemical stress test 01/22/2017: 1. No reversible ischemia or infarction. 2. Normal left ventricular wall motion. 3. Left ventricular ejection fraction 73% 4. Non invasive risk stratification: Low  EKG:   External EKG 09/28/2021: Sinus tachycardia with PACs at a rate of 125 bpm.  09/29/2021: Sinus tachycardia with frequent PACs at a rate of 107 bpm.  Left axis.  LVH with likely secondary ST-T wave abnormalities.  Assessment   No diagnosis found.    There are no discontinued medications.   No orders of the defined types were placed in this encounter.   Recommendations:   Jessica Burke is a 61 y.o. AA female with history of hypertension, hyperlipidemia, prediabetes, polymyositis (follows with rheumatology), osteoporosis, GERD. Has been on chronic steroids in starting in 1990s due to polymyositis.  Patient also has history of anxiety, particularly she reports when at medical offices.  Patient was originally referred for evaluation of "irregular heartbeat".  Patient was originally referred to our office by PCP for evaluation of irregular heartbeat.  At last office visit EKG revealed frequent PACs, therefore shared decision was to obtain 2-week cardiac monitor to evaluate for underlying arrhythmias as well as PAC burden.  Also last office visit discussed options of proceeding with echocardiogram, stress test, as well as beta-blocker therapy however patient wished to hold off on these.  2-week ambulatory telemetry revealed 2 episodes of SVT as well as frequent PVCs with PVC burden of 5.4%. ***  ***  I personally reviewed PCPs EKG which shows no evidence of atrial fibrillation, but rather sinus tachycardia with PACs.  I also reviewed external labs, lipids are uncontrolled  however given history of polymyositis PCP and patient have opted to hold off on statin therapy at this time.  Notably patient is very resistant to additional medications or medication changes.  She is otherwise fairly asymptomatic from a cardiovascular standpoint.  Patient's blood pressure is well controlled on home monitoring, however it is elevated in the office today.  I suspect a component of whitecoat hypertension.  Will defer further management of both hyperlipidemia and hypertension to PCP.  Patient does have evidence of frequent PACs, therefore shared decision was to obtain 2-week cardiac telemetry to evaluate PAC burden as well as to evaluate for underlying significant arrhythmias.  Could consider restarting metoprolol pending results of cardiac monitoring.  Also discussed with patient option to proceed with echocardiogram and stress test, however patient wishes to hold off at this time which is reasonable.  Further recommendations pending results of cardiac testing.  During this visit I reviewed and updated: Tobacco history   allergies  medication reconciliation   medical history   surgical history   family history   social history.  This note was created using a voice recognition software as a result there may be grammatical errors inadvertently enclosed that do not reflect the nature of this encounter. Every attempt is made to correct such errors.   Alethia Berthold, PA-C  10/26/2021, 12:22 PM Office: [phone]

## 2021-10-27 ENCOUNTER — Ambulatory Visit: Payer: Medicare HMO | Admitting: Student

## 2021-10-27 ENCOUNTER — Encounter: Payer: Self-pay | Admitting: Student

## 2021-10-27 ENCOUNTER — Other Ambulatory Visit: Payer: Self-pay | Admitting: Student

## 2021-10-27 ENCOUNTER — Other Ambulatory Visit: Payer: Self-pay

## 2021-10-27 VITALS — BP 162/102 | HR 106 | Temp 97.3°F | Resp 16 | Ht 63.0 in | Wt 140.6 lb

## 2021-10-27 DIAGNOSIS — R072 Precordial pain: Secondary | ICD-10-CM

## 2021-10-27 DIAGNOSIS — R0609 Other forms of dyspnea: Secondary | ICD-10-CM

## 2021-10-27 DIAGNOSIS — I491 Atrial premature depolarization: Secondary | ICD-10-CM

## 2021-10-27 MED ORDER — METOPROLOL SUCCINATE ER 25 MG PO TB24: 12.5000 mg | ORAL_TABLET | Freq: Every day | ORAL | 3 refills | Status: DC

## 2021-10-27 NOTE — Progress Notes (Signed)
Primary Physician/Referring:  Deland Pretty, MD  Patient ID: Jessica Burke, female    DOB: [birth year], 61 y.o.   MRN: [id number]  Chief Complaint  Patient presents with   Results   Follow-up   HPI:    Jessica Burke  is a 61 y.o. AA female with history of hypertension, hyperlipidemia, prediabetes, polymyositis (follows with rheumatology), osteoporosis, GERD. Has been on chronic steroids in starting in 1990s due to polymyositis.  Patient also has history of anxiety, particularly she reports when at medical offices.  Patient was originally referred to our office for evaluation of irregular heartbeat by PCP.  Patient presents for 4-week follow-up.  At last office visit EKG noted frequent PACs, therefore ordered cardiac monitor to evaluate PVC burden.  Also at that time discussed with patient medication management as well as echocardiogram or stress test, however patient was resistant to these last office visit.  Patient's monitor revealed 2 episodes of SVT and frequent PACs with PAC burden of 5.4%.  Patient complains of intermittent dyspnea, fatigue, and occasional episodes of chest pain over the last several years.  She is enrolled in RPM with PCPs office and brings with her a blood pressure log including the following readings over the last 1 month: 137/89, 98/67, 110/75, 97/53, 70-90 bpm.   Past Medical History:  Diagnosis Date   Anemia    Arthritis    CAD (coronary artery disease)    Chest pain 12/2016   Colon polyps    Depression    2003   GERD (gastroesophageal reflux disease)    H/O corticosteroid therapy 09/23/2019   History of abnormal cervical Pap smear    and LEEP   Hyperlipidemia    Hypertension    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[place])    Osteoporosis    Pneumonia    2016   Polymyositis ([place]) 12/2016   Pre-diabetes    Shortness of breath dyspnea    Vitamin D deficiency 09/24/2019   Past Surgical History:  Procedure Laterality Date   CESAREAN SECTION      COLONOSCOPY W/ BIOPSIES     COLONOSCOPY WITH PROPOFOL N/A 12/27/2020   Procedure: COLONOSCOPY WITH PROPOFOL;  Surgeon: Doran Stabler, MD;  [facility];  Service: Gastroenterology;  Laterality: N/A;   ENDOMETRIAL ABLATION     HEMOSTASIS CLIP PLACEMENT  12/27/2020   Procedure: HEMOSTASIS CLIP PLACEMENT;  Surgeon: Doran Stabler, MD;  [facility];  Service: Gastroenterology;;   LEEP  2006   ORIF TIBIA FRACTURE Left 09/22/2019   Procedure: OPEN REDUCTION INTERNAL FIXATION (ORIF) PROXIMAL TIBIA FRACTURE;  Surgeon: Altamese [place]oka, MD;  Location: [facility];  Service: Orthopedics;  Laterality: Left;   POLYPECTOMY  12/27/2020   Procedure: POLYPECTOMY;  Surgeon: Doran Stabler, MD;  [facility];  Service: Gastroenterology;;   SUBMUCOSAL TATTOO INJECTION  12/27/2020   Procedure: SUBMUCOSAL TATTOO INJECTION;  Surgeon: Doran Stabler, MD;  [facility];  Service: Gastroenterology;;   UTERINE ARTERY EMBOLIZATION  05/19/2008   Family History  Problem Relation Age of Onset   Hypertension Father    Heart disease Father        IHSS    Hypertension Mother    Osteoporosis Mother    Diabetes Maternal Aunt    Breast cancer Maternal Aunt     Social History   Tobacco Use   Smoking status: Former    Packs/day: 0.25    Years: 5.00    Pack years: 1.25  Types: Cigarettes    Quit date: 12/25/1988    Years since quitting: 32.8   Smokeless tobacco: Never  Substance Use Topics   Alcohol use: No   Marital Status: Married   ROS  Review of Systems  Cardiovascular:  Negative for chest pain, claudication, leg swelling, near-syncope, orthopnea, palpitations, paroxysmal nocturnal dyspnea and syncope.  Respiratory:  Negative for shortness of breath.   Neurological:  Negative for dizziness.   Objective  Blood pressure (!) 162/102, pulse (!) 106, temperature (!) 97.3 F (36.3 C), temperature source Temporal, resp. rate 16, height 5\' 3"  (1.6 m), weight  140 lb 9.6 oz (63.8 kg), last menstrual period 11/26/2012, SpO2 98 %.  Vitals with BMI 10/27/2021 10/27/2021 09/29/2021  Height - 5\' 3"  5' 3.5"  Weight - 140 lbs 10 oz 142 lbs 3 oz  BMI - 23.76 28.31  Systolic 517 616 073  Diastolic 710 626 95  Pulse 106 116 116      Physical Exam Vitals reviewed.  Constitutional:      Comments: Wheelchair-bound  Cardiovascular:     Rate and Rhythm: Regular rhythm. Tachycardia present. Occasional Extrasystoles are present.    Pulses: Intact distal pulses.          Carotid pulses are 2+ on the right side and 2+ on the left side.      Radial pulses are 2+ on the right side and 2+ on the left side.       Dorsalis pedis pulses are 1+ on the right side and 1+ on the left side.       Posterior tibial pulses are 1+ on the right side and 1+ on the left side.     Heart sounds: S1 normal and S2 normal. No murmur heard.   No gallop.  Pulmonary:     Effort: Pulmonary effort is normal. No respiratory distress.     Breath sounds: No wheezing, rhonchi or rales.  Musculoskeletal:     Right lower leg: No edema.     Left lower leg: No edema.  Neurological:     Mental Status: She is alert.  Physical exam unchanged compared to previous office visit.  Laboratory examination:   Recent Labs    12/26/20 1526 01/16/21 1451 06/30/21 0247  NA 142 138 139  K 4.2 3.9 3.9  CL 105 104 103  CO2 31 27 27   GLUCOSE 97 144* 88  BUN 9 13 9   CREATININE <0.30* 0.30* <0.30*  CALCIUM 9.1 9.2 9.1  GFRNONAA NOT CALCULATED >60 NOT CALCULATED   CrCl cannot be calculated (Patient's most recent lab result is older than the maximum 21 days allowed.).  CMP Latest Ref Rng & Units 06/30/2021 01/16/2021 12/26/2020  Glucose 70 - 99 mg/dL 88 144(H) 97  BUN 8 - 23 mg/dL 9 13 9   Creatinine 0.44 - 1.00 mg/dL <0.30(L) 0.30(L) <0.30(L)  Sodium 135 - 145 mmol/L 139 138 142  Potassium 3.5 - 5.1 mmol/L 3.9 3.9 4.2  Chloride 98 - 111 mmol/L 103 104 105  CO2 22 - 32 mmol/L 27 27 31   Calcium 8.9 -  10.3 mg/dL 9.1 9.2 9.1  Total Protein 6.5 - 8.1 g/dL - 7.6 -  Total Bilirubin 0.3 - 1.2 mg/dL - 0.3 -  Alkaline Phos 38 - 126 U/L - 57 -  AST 15 - 41 U/L - 19 -  ALT 0 - 44 U/L - 16 -   CBC Latest Ref Rng & Units 06/30/2021 01/16/2021 12/26/2020  WBC 4.0 - 10.5  K/uL 8.2 10.6(H) 11.6(H)  Hemoglobin 12.0 - 15.0 g/dL 12.2 12.7 12.5  Hematocrit 36.0 - 46.0 % 40.2 41.6 41.6  Platelets 150 - 400 K/uL 207 331 258    Lipid Panel No results for input(s): CHOL, TRIG, LDLCALC, VLDL, HDL, CHOLHDL, LDLDIRECT in the last 8760 hours.  HEMOGLOBIN A1C Lab Results  Component Value Date   HGBA1C 5.7 (H) 01/22/2017   MPG 117 01/22/2017   TSH No results for input(s): TSH in the last 8760 hours.  External labs:   09/25/2021: Total cholesterol 171, HDL 46, LDL 112, triglycerides 64 Hgb 12.9, HCT 41.9, platelet 241 BUN 11, creatinine 0.32, potassium 4.4, sodium 141, GFR >60  Allergies   Allergies  Allergen Reactions   Sertraline Other (See Comments)    Chest pain/ increase blood pressure Other reaction(s): Unknown    Medications Prior to Visit:   Outpatient Medications Prior to Visit  Medication Sig Dispense Refill   acetaminophen (TYLENOL) 500 MG tablet Take 1 tablet (500 mg total) by mouth every 8 (eight) hours. (Patient taking differently: Take 500 mg by mouth every 8 (eight) hours as needed for mild pain or moderate pain.) 60 tablet 0   ACTHAR 80 UNIT/ML injectable gel Inject 80 Units into the skin once a week. Friday     APPLE CIDER VINEGAR PO Take 2 capsules by mouth daily. Gummies     ascorbic acid (VITAMIN C) 500 MG tablet Take 1 tablet (500 mg total) by mouth daily. 30 tablet 1   Cholecalciferol (VITAMIN D) 125 MCG (5000 UT) CAPS Take 1 capsule by mouth daily. (Patient taking differently: Take 5,000 Units by mouth daily.) 30 capsule 3   denosumab (PROLIA) 60 MG/ML SOSY injection Inject 60 mg into the skin every 6 (six) months.     Multiple Vitamin (MULTIVITAMIN) tablet Take 1 tablet by  mouth daily.     Omega-3 Fatty Acids (OMEGA-3 2100 PO)      riTUXimab (RITUXAN IV) Inject into the vein every 6 (six) months.     telmisartan-hydrochlorothiazide (MICARDIS HCT) 40-12.5 MG tablet Take 1 tablet by mouth daily.     Turmeric POWD See admin instructions.     Zinc Oxide (TRIPLE PASTE) 12.8 % ointment Apply 1 application topically daily as needed for irritation.     No facility-administered medications prior to visit.   Final Medications at End of Visit    Current Meds  Medication Sig   acetaminophen (TYLENOL) 500 MG tablet Take 1 tablet (500 mg total) by mouth every 8 (eight) hours. (Patient taking differently: Take 500 mg by mouth every 8 (eight) hours as needed for mild pain or moderate pain.)   ACTHAR 80 UNIT/ML injectable gel Inject 80 Units into the skin once a week. Friday   APPLE CIDER VINEGAR PO Take 2 capsules by mouth daily. Gummies   ascorbic acid (VITAMIN C) 500 MG tablet Take 1 tablet (500 mg total) by mouth daily.   Cholecalciferol (VITAMIN D) 125 MCG (5000 UT) CAPS Take 1 capsule by mouth daily. (Patient taking differently: Take 5,000 Units by mouth daily.)   denosumab (PROLIA) 60 MG/ML SOSY injection Inject 60 mg into the skin every 6 (six) months.   metoprolol succinate (TOPROL-XL) 25 MG 24 hr tablet Take 0.5 tablets (12.5 mg total) by mouth daily.   Multiple Vitamin (MULTIVITAMIN) tablet Take 1 tablet by mouth daily.   Omega-3 Fatty Acids (OMEGA-3 2100 PO)    riTUXimab (RITUXAN IV) Inject into the vein every 6 (six) months.  telmisartan-hydrochlorothiazide (MICARDIS HCT) 40-12.5 MG tablet Take 1 tablet by mouth daily.   Turmeric POWD See admin instructions.   Zinc Oxide (TRIPLE PASTE) 12.8 % ointment Apply 1 application topically daily as needed for irritation.   Radiology:   No results found.  Cardiac Studies:  Ambulatory cardiac telemetry 3 days (09/29/2021 - 10/03/2021): Predominant underlying rhythm was sinus with 2 episodes of supraventricular  tachycardia, longest lasting 9 beats with a maximum heart rate of 197 bpm.  Rare PVCs, frequent PACs with 5.4% burden.  There were no patient triggered events.  No evidence of significant cardiac arrhythmias including atrial fibrillation, VT, high degree AV block, or pauses >3 seconds.  Echocardiogram 01/22/2017:  Left ventricle: The cavity size was normal. Wall thickness was   increased in a pattern of mild LVH. Systolic function was normal.   The estimated ejection fraction was in the range of 60% to 65%.   Wall motion was normal; there were no regional wall motion    abnormalities. Doppler parameters are consistent with abnormal   left ventricular relaxation (grade 1 diastolic dysfunction). The   E/e&' ratio is <8, suggesting normal LV filling pressure.  -Aortic valve: Trileaflet. Sclerosis without stenosis. There was   trivial regurgitation.  - Mitral valve: Mildly thickened leaflets . There was trivial   regurgitation.  - Left atrium: The atrium was normal in size.  - Right atrium: The atrium was normal in size.  - Tricuspid valve: There was mild regurgitation.  - Pulmonary arteries: PA peak pressure: 45 mm Hg (S).  - Inferior vena cava: The vessel was normal in size. The   respirophasic diameter changes were in the normal range (= 50%),   consistent with normal central venous pressure.   Chemical stress test 01/22/2017: 1. No reversible ischemia or infarction. 2. Normal left ventricular wall motion. 3. Left ventricular ejection fraction 73% 4. Non invasive risk stratification: Low  EKG:   External EKG 09/28/2021: Sinus tachycardia with PACs at a rate of 125 bpm.  09/29/2021: Sinus tachycardia with frequent PACs at a rate of 107 bpm.  Left axis.  LVH with likely secondary ST-T wave abnormalities.  Assessment     ICD-10-CM   1. PAC (premature atrial contraction)  I49.1     2. Dyspnea on exertion  R06.09 Myocardial Perfusion Imaging    ECHOCARDIOGRAM COMPLETE    3. Precordial pain   R07.2 Myocardial Perfusion Imaging    ECHOCARDIOGRAM COMPLETE       There are no discontinued medications.   Meds ordered this encounter  Medications   metoprolol succinate (TOPROL-XL) 25 MG 24 hr tablet    Sig: Take 0.5 tablets (12.5 mg total) by mouth daily.    Dispense:  15 tablet    Refill:  3    Recommendations:   Jessica Burke is a 61 y.o. AA female with history of hypertension, hyperlipidemia, prediabetes, polymyositis (follows with rheumatology), osteoporosis, GERD. Has been on chronic steroids in starting in 1990s due to polymyositis.  Patient also has history of anxiety, particularly she reports when at medical offices.  Patient was originally referred for evaluation of "irregular heartbeat".  Patient was originally referred to our office for evaluation of irregular heartbeat by PCP.  Patient presents for 4-week follow-up.  At last office visit EKG noted frequent PACs, therefore ordered cardiac monitor to evaluate PVC burden.  Also at that time discussed with patient medication management as well as echocardiogram or stress test, however patient was resistant to these last office  visit.  Patient's monitor revealed 2 episodes of SVT and frequent PACs with PAC burden of 5.4%.  Reviewed and discussed results of cardiac monitor with patient and her husband who is present at bedside, details above.  Given episodes of dyspnea and chest pain as well as PVC burden of 5.4% shared decision was to proceed with further evaluation including echocardiogram and nuclear stress test which will be scheduled at the hospital given patient's limited mobility secondary to polymyositis.   Decision was also to start low-dose metoprolol succinate 12.5 mg daily in order to treat PACs.  Patient does have episodes of soft blood pressure on home monitoring, advised her to continue to monitor blood pressure closely.  Will defer further management pressure to PCP.  Follow-up in 3 months, sooner if  needed.   Jessica Berthold, PA-C 10/27/2021, 12:32 PM Office: [phone]

## 2021-10-31 ENCOUNTER — Telehealth (HOSPITAL_COMMUNITY): Payer: Self-pay | Admitting: Student

## 2021-10-31 NOTE — Telephone Encounter (Signed)
Called patient to schedule echocardiogram that you ordered.  Please see below: ? ?10/31/21 Called patient and she does not wish to schedule at this time due to a death and will call us back to schedule at a later date. LBW 9:42 ?Order will be removed from the active echo wq and if patient calls back we will reinstate the order. Thank you ?

## 2021-10-31 NOTE — Telephone Encounter (Signed)
Thank you for letting us know.  

## 2021-11-07 ENCOUNTER — Telehealth (HOSPITAL_COMMUNITY): Payer: Self-pay | Admitting: Student

## 2021-11-07 NOTE — Telephone Encounter (Signed)
11/07/21 Called and spoke with patient  to schedule Myoview. Patient  doesnt wish to scheduled at this time due to the death of her husband. She will call us back when she is ready to schedule/LBW 8:59 Order will be removed from the [facility] and when patient calls back we will reinstate the order. Thank you ? ?

## 2021-11-29 DIAGNOSIS — I10 Essential (primary) hypertension: Secondary | ICD-10-CM | POA: Diagnosis not present

## 2021-11-29 DIAGNOSIS — M3320 Polymyositis, organ involvement unspecified: Secondary | ICD-10-CM | POA: Diagnosis not present

## 2021-11-29 DIAGNOSIS — Z79899 Other long term (current) drug therapy: Secondary | ICD-10-CM | POA: Diagnosis not present

## 2021-11-29 DIAGNOSIS — Z7952 Long term (current) use of systemic steroids: Secondary | ICD-10-CM | POA: Diagnosis not present

## 2021-11-29 DIAGNOSIS — E785 Hyperlipidemia, unspecified: Secondary | ICD-10-CM | POA: Diagnosis not present

## 2021-11-29 DIAGNOSIS — M810 Age-related osteoporosis without current pathological fracture: Secondary | ICD-10-CM | POA: Diagnosis not present

## 2021-12-01 ENCOUNTER — Other Ambulatory Visit: Payer: Self-pay | Admitting: Student

## 2021-12-24 DIAGNOSIS — M810 Age-related osteoporosis without current pathological fracture: Secondary | ICD-10-CM | POA: Diagnosis not present

## 2021-12-24 DIAGNOSIS — I2510 Atherosclerotic heart disease of native coronary artery without angina pectoris: Secondary | ICD-10-CM | POA: Diagnosis not present

## 2021-12-24 DIAGNOSIS — I10 Essential (primary) hypertension: Secondary | ICD-10-CM | POA: Diagnosis not present

## 2021-12-27 DIAGNOSIS — M810 Age-related osteoporosis without current pathological fracture: Secondary | ICD-10-CM | POA: Diagnosis not present

## 2021-12-27 DIAGNOSIS — I10 Essential (primary) hypertension: Secondary | ICD-10-CM | POA: Diagnosis not present

## 2022-01-03 ENCOUNTER — Telehealth: Payer: Self-pay

## 2022-01-03 NOTE — Telephone Encounter (Signed)
BP readings during routine home visit: ? ?Sitting RA 134/470 ?Sitting LA 138/72 ? ?Standing LA 148/78 ?152/80 ? ?Denies SOB, nausea, altered mental status. Patient was advised to continue to take all prescribed, and patient is still refusing to take Metoprolol and Micardis HCT.  ? ?During pharmacist appointment @ [facility] and patient's BP was : ? ?170/119 ? ?12/27/2021: ? ?161/114 ?132/99 ?160/112 ?129/94 ? ?12/28/2021: ?119/87 ? ?01/01/2022: ?96/73 ? ?On average form May 5th to May 9th: ?Her BP's are running 108/81 ? ?[facility] ants patient to re-considered the Echocardiogram. ? Raford Pitcher ?[phone] ? ?

## 2022-01-24 DIAGNOSIS — I2510 Atherosclerotic heart disease of native coronary artery without angina pectoris: Secondary | ICD-10-CM | POA: Diagnosis not present

## 2022-01-24 DIAGNOSIS — I10 Essential (primary) hypertension: Secondary | ICD-10-CM | POA: Diagnosis not present

## 2022-01-24 DIAGNOSIS — M810 Age-related osteoporosis without current pathological fracture: Secondary | ICD-10-CM | POA: Diagnosis not present

## 2022-01-25 ENCOUNTER — Encounter (HOSPITAL_COMMUNITY): Payer: Self-pay | Admitting: Radiology

## 2022-01-25 ENCOUNTER — Emergency Department (HOSPITAL_COMMUNITY): Payer: Medicare HMO

## 2022-01-25 ENCOUNTER — Emergency Department (HOSPITAL_COMMUNITY)
Admission: EM | Admit: 2022-01-25 | Discharge: 2022-01-25 | Disposition: A | Discharge status: Home / Self Care | Payer: Medicare HMO | Attending: Emergency Medicine | Admitting: Emergency Medicine

## 2022-01-25 ENCOUNTER — Other Ambulatory Visit: Payer: Self-pay

## 2022-01-25 DIAGNOSIS — Z743 Need for continuous supervision: Secondary | ICD-10-CM | POA: Diagnosis not present

## 2022-01-25 DIAGNOSIS — R0789 Other chest pain: Secondary | ICD-10-CM | POA: Diagnosis not present

## 2022-01-25 DIAGNOSIS — I2510 Atherosclerotic heart disease of native coronary artery without angina pectoris: Secondary | ICD-10-CM | POA: Insufficient documentation

## 2022-01-25 DIAGNOSIS — R000 Tachycardia, unspecified: Secondary | ICD-10-CM | POA: Diagnosis not present

## 2022-01-25 DIAGNOSIS — R079 Chest pain, unspecified: Secondary | ICD-10-CM | POA: Diagnosis not present

## 2022-01-25 DIAGNOSIS — Z87891 Personal history of nicotine dependence: Secondary | ICD-10-CM | POA: Diagnosis not present

## 2022-01-25 DIAGNOSIS — Z79899 Other long term (current) drug therapy: Secondary | ICD-10-CM | POA: Insufficient documentation

## 2022-01-25 DIAGNOSIS — I10 Essential (primary) hypertension: Secondary | ICD-10-CM | POA: Insufficient documentation

## 2022-01-25 DIAGNOSIS — I491 Atrial premature depolarization: Secondary | ICD-10-CM | POA: Diagnosis not present

## 2022-01-25 LAB — CBC
HCT: 39.2 % (ref 36.0–46.0)
Hemoglobin: 12.3 g/dL (ref 12.0–15.0)
MCH: 28.8 pg (ref 26.0–34.0)
MCHC: 31.4 g/dL (ref 30.0–36.0)
MCV: 91.8 fL (ref 80.0–100.0)
Platelets: 220 10*3/uL (ref 150–400)
RBC: 4.27 MIL/uL (ref 3.87–5.11)
RDW: 15 % (ref 11.5–15.5)
WBC: 8 10*3/uL (ref 4.0–10.5)
nRBC: 0 % (ref 0.0–0.2)

## 2022-01-25 LAB — URINALYSIS, ROUTINE W REFLEX MICROSCOPIC
Bilirubin Urine: NEGATIVE
Glucose, UA: NEGATIVE mg/dL
Hgb urine dipstick: NEGATIVE
Ketones, ur: 5 mg/dL — AB
Leukocytes,Ua: NEGATIVE
Nitrite: NEGATIVE
Protein, ur: NEGATIVE mg/dL
Specific Gravity, Urine: 1.006 (ref 1.005–1.030)
pH: 8 (ref 5.0–8.0)

## 2022-01-25 LAB — TROPONIN I (HIGH SENSITIVITY)
Troponin I (High Sensitivity): <2 ng/L (ref <18)
Troponin I (High Sensitivity): 3 ng/L (ref <18)

## 2022-01-25 LAB — BASIC METABOLIC PANEL
Anion gap: 7 (ref 5–15)
BUN: 6 mg/dL — ABNORMAL LOW (ref 8–23)
CO2: 29 mmol/L (ref 22–32)
Calcium: 9.4 mg/dL (ref 8.9–10.3)
Chloride: 103 mmol/L (ref 98–111)
Creatinine, Ser: <0.3 mg/dL — ABNORMAL LOW (ref 0.44–1.00)
Glucose, Bld: 108 mg/dL — ABNORMAL HIGH (ref 70–99)
Potassium: 3.8 mmol/L (ref 3.5–5.1)
Sodium: 139 mmol/L (ref 135–145)

## 2022-01-25 LAB — CK: Total CK: 26 U/L — ABNORMAL LOW (ref 38–234)

## 2022-01-25 MED ORDER — IOHEXOL 350 MG/ML SOLN
100.0000 mL | Freq: Once | INTRAVENOUS | Status: AC | PRN
  Administered 2022-01-25: 100 mL via INTRAVENOUS

## 2022-01-25 MED ORDER — NITROGLYCERIN 0.4 MG SL SUBL
SUBLINGUAL_TABLET | SUBLINGUAL | Status: AC
  Administered 2022-01-25: 0.8 mg via SUBLINGUAL
  Filled 2022-01-25: qty 2

## 2022-01-25 MED ORDER — METOPROLOL TARTRATE 5 MG/5ML IV SOLN
INTRAVENOUS | Status: AC
  Administered 2022-01-25: 5 mg via INTRAVENOUS
  Filled 2022-01-25: qty 5

## 2022-01-25 MED ORDER — METOPROLOL TARTRATE 25 MG PO TABS
100.0000 mg | ORAL_TABLET | Freq: Once | ORAL | Status: AC
  Administered 2022-01-25: 100 mg via ORAL
  Filled 2022-01-25: qty 4

## 2022-01-25 MED ORDER — METOPROLOL TARTRATE 5 MG/5ML IV SOLN: 5.0000 mg | Freq: Once | INTRAVENOUS | Status: AC

## 2022-01-25 MED ORDER — NITROGLYCERIN 0.4 MG SL SUBL: 0.8000 mg | SUBLINGUAL_TABLET | Freq: Once | SUBLINGUAL | Status: AC

## 2022-01-25 NOTE — ED Triage Notes (Signed)
Pt BIB EMS due to mid sternal chest pressure that started at 8 am, gradual onset that radiated to neck. Denies n/v SOB. Increased fatigued for about a day. Pt has hx of SVT and a muscle disease. Pt takes metoprolol. Axox4. VSS. Pt received 324g of Asprin and nitro with EMS.

## 2022-01-25 NOTE — Consult Note (Signed)
CARDIOLOGY CONSULT NOTE  Patient ID: Jessica Burke MRN: [id number] DOB/AGE: Apr 30, 1961 61 y.o.  Admit date: 01/25/2022 Referring Physician: [facility] Reason for Consultation:  Chest pain  HPI:   61 y.o. African American female  with hypertension, hyperlipidemia, prediabetes, polymyositis, severe bilateral lower extremity weakness, presents with chest pain.  This morning, patient did her routine exercises, which primarily includes upper body exercises.  She was go to the bathroom, when she felt left-sided and central chest tightness around 8 AM.  Episode lasted for about 10 minutes, and had already started subsiding after taking two 81 mg aspirin pills, prior to EMS arrived.  EMS gave 1 sublingual nitroglycerin, that further improve the pain.  Work-up in the ER so far as showed normal EKG, high sensitive troponin of <2.  Currently, patient denies any chest pain.  Patient reports longstanding history of polymyositis for which she has been on chronic steroid therapy.  She has had symptoms of aches and pains all over her body, including her chest, abdomen prior to this.  She was seen by Lawerance Cruel, PA in the office in March 2023.  She was supposed to undergo nuclear stress testing at some point, but this never happened.  Patient has been under a lot of stress and anxiety lately, after sudden passing of her husband.  Past Medical History:  Diagnosis Date   Anemia    Arthritis    CAD (coronary artery disease)    Chest pain 12/2016   Colon polyps    Depression    2003   GERD (gastroesophageal reflux disease)    H/O corticosteroid therapy 09/23/2019   History of abnormal cervical Pap smear    and LEEP   Hyperlipidemia    Hypertension    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[place])    Osteoporosis    Pneumonia    2016   Polymyositis ([place]) 12/2016   Pre-diabetes    Shortness of breath dyspnea    Vitamin D deficiency 09/24/2019     Past Surgical History:  Procedure Laterality Date    CESAREAN SECTION     COLONOSCOPY W/ BIOPSIES     COLONOSCOPY WITH PROPOFOL N/A 12/27/2020   Procedure: COLONOSCOPY WITH PROPOFOL;  Surgeon: Doran Stabler, MD;  [facility];  Service: Gastroenterology;  Laterality: N/A;   ENDOMETRIAL ABLATION     HEMOSTASIS CLIP PLACEMENT  12/27/2020   Procedure: HEMOSTASIS CLIP PLACEMENT;  Surgeon: Doran Stabler, MD;  [facility];  Service: Gastroenterology;;   LEEP  2006   ORIF TIBIA FRACTURE Left 09/22/2019   Procedure: OPEN REDUCTION INTERNAL FIXATION (ORIF) PROXIMAL TIBIA FRACTURE;  Surgeon: Altamese [place], MD;  Location: [facility];  Service: Orthopedics;  Laterality: Left;   POLYPECTOMY  12/27/2020   Procedure: POLYPECTOMY;  Surgeon: Doran Stabler, MD;  [facility];  Service: Gastroenterology;;   SUBMUCOSAL TATTOO INJECTION  12/27/2020   Procedure: SUBMUCOSAL TATTOO INJECTION;  Surgeon: Doran Stabler, MD;  [facility];  Service: Gastroenterology;;   UTERINE ARTERY EMBOLIZATION  05/19/2008      Family History  Problem Relation Age of Onset   Hypertension Father    Heart disease Father        IHSS    Hypertension Mother    Osteoporosis Mother    Diabetes Maternal Aunt    Breast cancer Maternal Aunt      Social History: Social History   Socioeconomic History   Marital status: Married    Spouse name:  [place]   Number of children: 1   Years of education: 14   Highest education level: Not on file  Occupational History   Occupation: retired   Occupation: disability  Tobacco Use   Smoking status: Former    Packs/day: 0.25    Years: 5.00    Pack years: 1.25    Types: Cigarettes    Quit date: 12/25/1988    Years since quitting: 33.1   Smokeless tobacco: Never  Vaping Use   Vaping Use: Never used  Substance and Sexual Activity   Alcohol use: No   Drug use: No   Sexual activity: Not Currently    Birth control/protection: Post-menopausal  Other Topics Concern   Not on file   Social History Narrative   Patient states she has about 4 cups of caffeine daily.   Patient is right handed.    Social Determinants of Health   Financial Resource Strain: Not on file  Food Insecurity: Not on file  Transportation Needs: Not on file  Physical Activity: Not on file  Stress: Not on file  Social Connections: Not on file  Intimate Partner Violence: Not on file     (Not in a hospital admission)   Review of Systems  Cardiovascular:  Positive for chest pain. Negative for dyspnea on exertion, leg swelling, palpitations and syncope.     Physical Exam: Physical Exam Vitals and nursing note reviewed.  Constitutional:      General: She is not in acute distress. Neck:     Vascular: No JVD.  Cardiovascular:     Rate and Rhythm: Normal rate and regular rhythm.     Heart sounds: Normal heart sounds. No murmur heard. Pulmonary:     Effort: Pulmonary effort is normal.     Breath sounds: Normal breath sounds. No wheezing or rales.  Musculoskeletal:     Right lower leg: No edema.     Left lower leg: No edema.       Imaging/tests reviewed and independently interpreted: Lab Results: CBC. BMP, trop HS  Cardiac Studies:  Telemetry 01/25/2022: Arrhythmia  EKG 01/25/2022: Sinus rhythm Normal EKG   Assessment & Recommendations:  60 y.o. African American female  with hypertension, hyperlipidemia, prediabetes, polymyositis, severe bilateral lower extremity weakness, presents with chest pain.  Chest pain: Some features of typical angina, however, resolution without nitroglycerin.  Note significant risk factors for CAD.  Work-up so far with normal EKG and high sensor troponin x1. We will obtain coronary CT angiogram. We will administer p.o. metoprolol tartrate 100 mg now.  If no obstructive CAD, or any other obvious abnormality noted, patient would like to go home from ER.  If significant obstructive CAD found, she may need further management.  Discussed  interpretation of tests and management recommendations with the primary team     Nigel Mormon, MD Pager: [phone] Office: [phone]

## 2022-01-25 NOTE — ED Provider Notes (Addendum)
Assumed care from Dr. Gilford Raid at 330.  Patient waiting for coronary CT as instructed by cardiology.  Patient's coronary CT which was evaluated by Dr. Audie Box shows minimally obstructive CAD.  Patient is stable for discharge home and follow-up.   Blanchie Dessert, MD 01/25/22 1707    Blanchie Dessert, MD 01/25/22 1730

## 2022-01-25 NOTE — ED Notes (Signed)
Patient transported to CT 

## 2022-01-25 NOTE — ED Notes (Signed)
RN reviewed discharge instructions w/ pt and family. Follow up and test results reviewed, no further questions.

## 2022-01-25 NOTE — Discharge Instructions (Addendum)
Your coronary CT was normal today.

## 2022-01-25 NOTE — ED Provider Notes (Signed)
[facility] EMERGENCY DEPARTMENT Provider Note   CSN: [id number] Arrival date & time: 01/25/22  1036     History  Chief Complaint  Patient presents with   Chest Pain    Jessica Burke is a 61 y.o. female.  Pt is a 61 yo female with a pmhx significant for htn, MD, arthritis, anemia, polymyositis, PACs, SVT, hyperlipidemia, and CAD.  Pt is followed by [facility] and last saw then in March.  A myocardial perfusion stress test and ECHO were ordered, but pt has not yet had these done.  Pt said she felt weak all over this morning when her grandson got her up.  Pt developed cp around 0800.  She said the pain radiated up to her neck.  She called EMS who gave her 1 nitro and 324 mg ASA.  Pt said pain went away after the nitro.  She feels well now.  She denies any sob.  No n/v.        Home Medications Prior to Admission medications   Medication Sig Start Date End Date Taking? Authorizing Provider  ACTHAR 80 UNIT/ML injectable gel Inject 80 Units into the skin once a week. Friday 04/20/19  Yes [provider]  APPLE CIDER VINEGAR PO Take 2 capsules by mouth daily. Gummies   Yes [provider]  ascorbic acid (VITAMIN C) 500 MG tablet Take 1 tablet (500 mg total) by mouth daily. 09/26/19  Yes Ainsley Spinner, PA-C  Cholecalciferol (VITAMIN D) 125 MCG (5000 UT) CAPS Take 1 capsule by mouth daily. Patient taking differently: Take 5,000 Units by mouth daily. 09/25/19  Yes Ainsley Spinner, PA-C  Multiple Vitamin (MULTIVITAMIN) tablet Take 1 tablet by mouth daily.   Yes [provider]  Omega-3 Fatty Acids (OMEGA-3 2100 PO)    Yes [provider]  telmisartan-hydrochlorothiazide (MICARDIS HCT) 40-12.5 MG tablet Take 1 tablet by mouth daily. 12/05/20  Yes [provider]  Turmeric POWD See admin instructions.   Yes [provider]  acetaminophen (TYLENOL) 500 MG tablet Take 1 tablet (500 mg total) by mouth every 8 (eight)  hours. Patient not taking: Reported on 01/25/2022 09/25/19   Ainsley Spinner, PA-C  denosumab (PROLIA) 60 MG/ML SOSY injection Inject 60 mg into the skin every 6 (six) months. Patient not taking: Reported on 01/25/2022    [provider]  metoprolol succinate (TOPROL-XL) 25 MG 24 hr tablet TAKE 1/2 TABLET BY MOUTH ONCE DAILY Patient not taking: Reported on 01/25/2022 12/04/21   Cantwell, Celeste C, PA-C  riTUXimab (RITUXAN IV) Inject into the vein every 6 (six) months. Patient not taking: Reported on 01/25/2022    [provider]  Zinc Oxide (TRIPLE PASTE) 12.8 % ointment Apply 1 application topically daily as needed for irritation. Patient not taking: Reported on 01/25/2022    [provider]  pantoprazole (PROTONIX) 40 MG tablet Take 1 tablet (40 mg total) by mouth daily. 01/24/17 04/30/19  Geradine Girt, DO      Allergies    Sertraline    Review of Systems   Review of Systems  Cardiovascular:  Positive for chest pain.  All other systems reviewed and are negative.  Physical Exam Updated Vital Signs BP (!) 151/81   Pulse 82   Temp 97.9 F (36.6 C) (Oral)   Resp 13   Ht '5\' 3"'$  (1.6 m)   Wt 61.2 kg   LMP 11/26/2012   SpO2 97%   BMI 23.91 kg/m  Physical Exam Vitals and  nursing note reviewed.  Constitutional:      Appearance: She is well-developed.  HENT:     Head: Normocephalic and atraumatic.  Eyes:     Extraocular Movements: Extraocular movements intact.     Pupils: Pupils are equal, round, and reactive to light.  Cardiovascular:     Rate and Rhythm: Normal rate and regular rhythm.     Heart sounds: Normal heart sounds.  Pulmonary:     Effort: Pulmonary effort is normal.     Breath sounds: Normal breath sounds.  Abdominal:     General: Bowel sounds are normal.     Palpations: Abdomen is soft.  Musculoskeletal:        General: Normal range of motion.     Cervical back: Normal range of motion and neck supple.  Skin:    General: Skin is warm and dry.      Capillary Refill: Capillary refill takes less than 2 seconds.  Neurological:     General: No focal deficit present.     Mental Status: She is alert and oriented to person, place, and time.  Psychiatric:        Mood and Affect: Mood normal.        Behavior: Behavior normal.    ED Results / Procedures / Treatments   Labs (all labs ordered are listed, but only abnormal results are displayed) Labs Reviewed  BASIC METABOLIC PANEL - Abnormal; Notable for the following components:      Result Value   Glucose, Bld 108 (*)    BUN 6 (*)    Creatinine, Ser <0.30 (*)    All other components within normal limits  URINALYSIS, ROUTINE W REFLEX MICROSCOPIC - Abnormal; Notable for the following components:   Color, Urine STRAW (*)    Ketones, ur 5 (*)    All other components within normal limits  CBC  CK  TROPONIN I (HIGH SENSITIVITY)  TROPONIN I (HIGH SENSITIVITY)    EKG EKG Interpretation  Date/Time:  Thursday January 25 2022 10:49:23 EDT Ventricular Rate:  76 PR Interval:  170 QRS Duration: 100 QT Interval:  377 QTC Calculation: 424 R Axis:   -8 Text Interpretation: Sinus rhythm Left ventricular hypertrophy No significant change since last tracing Confirmed by Isla Pence [phone]) on 01/25/2022 11:00:52 AM  Radiology DG Chest Port 1 View  Result Date: 01/25/2022 CLINICAL DATA:  Chest pain EXAM: PORTABLE CHEST 1 VIEW COMPARISON:  Chest radiograph dated June 30, 2021 FINDINGS: The heart size and mediastinal contours are within normal limits. Low lung volumes without evidence of focal consolidation or pleural effusion. The visualized skeletal structures are unremarkable. IMPRESSION: No active disease. Electronically Signed   By: Keane Police D.O.   On: 01/25/2022 11:35    Procedures Procedures    Medications Ordered in ED Medications  metoprolol tartrate (LOPRESSOR) tablet 100 mg (100 mg Oral Given 01/25/22 1356)    ED Course/ Medical Decision Making/ A&P                            Medical Decision Making Amount and/or Complexity of Data Reviewed Labs: ordered. Radiology: ordered.   This patient presents to the ED for concern of chest pain, this involves an extensive number of treatment options, and is a complaint that carries with it a high risk of complications and morbidity.  The differential diagnosis includes CAD, arrhythmia, pulm, gi   Co morbidities that complicate the patient evaluation  htn, MD,  arthritis, anemia, polymyositis, PACs, SVT, hyperlipidemia, and CAD   Additional history obtained:  Additional history obtained from epic chart review External records from outside source obtained and reviewed including EMS report   Lab Tests:  I Ordered, and personally interpreted labs.  The pertinent results include:  cbc nl; bmp nl; trop nl   Imaging Studies ordered:  I ordered imaging studies including cxr  I independently visualized and interpreted imaging which showed    IMPRESSION:  No active disease.   I agree with the radiologist interpretation   Cardiac Monitoring:  The patient was maintained on a cardiac monitor.  I personally viewed and interpreted the cardiac monitored which showed an underlying rhythm of: nsr   Medicines ordered and prescription drug management:  I have reviewed the patients home medicines and have made adjustments as needed   Test Considered:  CT coronary arteries   Critical Interventions:  Cardiology consult   Consultations Obtained:  I requested consultation with the cardiologist (Dr. Virgina Jock),  and discussed lab and imaging findings as well as pertinent plan - he will consult.  He did see pt.  He ordered a CT coronary arteries.  If the 2nd trop nl and that nl, she can be d/c with f/u in his office.   Problem List / ED Course:  Chest pain:  EKG and trop nl.  Pain gone with nitro.     Reevaluation:  After the interventions noted above, I reevaluated the patient and found that they have  :improved   Social Determinants of Health:  Lives at home with family   Dispostion:  Disposition pending CT coronary arteries.  Pt signed out to Dr. Maryan Rued.          Final Clinical Impression(s) / ED Diagnoses Final diagnoses:  Chest pain, unspecified type    Rx / DC Orders ED Discharge Orders     None         Isla Pence, MD 01/25/22 1426

## 2022-02-07 DIAGNOSIS — D7389 Other diseases of spleen: Secondary | ICD-10-CM | POA: Diagnosis not present

## 2022-02-07 DIAGNOSIS — I2510 Atherosclerotic heart disease of native coronary artery without angina pectoris: Secondary | ICD-10-CM | POA: Diagnosis not present

## 2022-02-09 ENCOUNTER — Other Ambulatory Visit: Payer: Self-pay | Admitting: Internal Medicine

## 2022-02-09 DIAGNOSIS — D7389 Other diseases of spleen: Secondary | ICD-10-CM

## 2022-02-20 ENCOUNTER — Other Ambulatory Visit (HOSPITAL_COMMUNITY): Payer: Self-pay | Admitting: Internal Medicine

## 2022-02-20 DIAGNOSIS — D7389 Other diseases of spleen: Secondary | ICD-10-CM

## 2022-02-22 ENCOUNTER — Other Ambulatory Visit: Payer: Self-pay | Admitting: Student

## 2022-02-23 DIAGNOSIS — I10 Essential (primary) hypertension: Secondary | ICD-10-CM | POA: Diagnosis not present

## 2022-02-23 DIAGNOSIS — I2510 Atherosclerotic heart disease of native coronary artery without angina pectoris: Secondary | ICD-10-CM | POA: Diagnosis not present

## 2022-02-23 DIAGNOSIS — M810 Age-related osteoporosis without current pathological fracture: Secondary | ICD-10-CM | POA: Diagnosis not present

## 2022-02-28 ENCOUNTER — Ambulatory Visit: Payer: Medicare HMO | Admitting: Gastroenterology

## 2022-02-28 DIAGNOSIS — I10 Essential (primary) hypertension: Secondary | ICD-10-CM | POA: Diagnosis not present

## 2022-02-28 DIAGNOSIS — E785 Hyperlipidemia, unspecified: Secondary | ICD-10-CM | POA: Diagnosis not present

## 2022-02-28 DIAGNOSIS — M3320 Polymyositis, organ involvement unspecified: Secondary | ICD-10-CM | POA: Diagnosis not present

## 2022-02-28 DIAGNOSIS — Z79899 Other long term (current) drug therapy: Secondary | ICD-10-CM | POA: Diagnosis not present

## 2022-02-28 DIAGNOSIS — Z7952 Long term (current) use of systemic steroids: Secondary | ICD-10-CM | POA: Diagnosis not present

## 2022-02-28 DIAGNOSIS — M810 Age-related osteoporosis without current pathological fracture: Secondary | ICD-10-CM | POA: Diagnosis not present

## 2022-03-04 ENCOUNTER — Emergency Department (HOSPITAL_COMMUNITY)
Admission: EM | Admit: 2022-03-04 | Discharge: 2022-03-05 | Disposition: A | Discharge status: Home / Self Care | Payer: Medicare HMO | Attending: Student | Admitting: Student

## 2022-03-04 ENCOUNTER — Other Ambulatory Visit: Payer: Self-pay

## 2022-03-04 ENCOUNTER — Emergency Department (HOSPITAL_COMMUNITY): Payer: Medicare HMO

## 2022-03-04 DIAGNOSIS — I10 Essential (primary) hypertension: Secondary | ICD-10-CM | POA: Diagnosis not present

## 2022-03-04 DIAGNOSIS — N3001 Acute cystitis with hematuria: Secondary | ICD-10-CM | POA: Diagnosis not present

## 2022-03-04 DIAGNOSIS — R739 Hyperglycemia, unspecified: Secondary | ICD-10-CM | POA: Insufficient documentation

## 2022-03-04 DIAGNOSIS — D72829 Elevated white blood cell count, unspecified: Secondary | ICD-10-CM | POA: Diagnosis not present

## 2022-03-04 DIAGNOSIS — N10 Acute tubulo-interstitial nephritis: Secondary | ICD-10-CM | POA: Insufficient documentation

## 2022-03-04 DIAGNOSIS — Z79899 Other long term (current) drug therapy: Secondary | ICD-10-CM | POA: Insufficient documentation

## 2022-03-04 DIAGNOSIS — D7389 Other diseases of spleen: Secondary | ICD-10-CM | POA: Diagnosis not present

## 2022-03-04 DIAGNOSIS — N1330 Unspecified hydronephrosis: Secondary | ICD-10-CM | POA: Diagnosis not present

## 2022-03-04 DIAGNOSIS — N1339 Other hydronephrosis: Secondary | ICD-10-CM | POA: Diagnosis not present

## 2022-03-04 DIAGNOSIS — N12 Tubulo-interstitial nephritis, not specified as acute or chronic: Secondary | ICD-10-CM

## 2022-03-04 DIAGNOSIS — R1030 Lower abdominal pain, unspecified: Secondary | ICD-10-CM | POA: Diagnosis not present

## 2022-03-04 DIAGNOSIS — N858 Other specified noninflammatory disorders of uterus: Secondary | ICD-10-CM | POA: Diagnosis not present

## 2022-03-04 DIAGNOSIS — K5790 Diverticulosis of intestine, part unspecified, without perforation or abscess without bleeding: Secondary | ICD-10-CM | POA: Diagnosis not present

## 2022-03-04 LAB — URINALYSIS, ROUTINE W REFLEX MICROSCOPIC
Bilirubin Urine: NEGATIVE
Glucose, UA: NEGATIVE mg/dL
Ketones, ur: 20 mg/dL — AB
Nitrite: NEGATIVE
Protein, ur: 100 mg/dL — AB
RBC / HPF: >50 RBC/hpf — ABNORMAL HIGH (ref 0–5)
Specific Gravity, Urine: 1.009 (ref 1.005–1.030)
WBC, UA: >50 WBC/hpf — ABNORMAL HIGH (ref 0–5)
pH: 6 (ref 5.0–8.0)

## 2022-03-04 LAB — CBC
HCT: 38.9 % (ref 36.0–46.0)
Hemoglobin: 12.1 g/dL (ref 12.0–15.0)
MCH: 28.3 pg (ref 26.0–34.0)
MCHC: 31.1 g/dL (ref 30.0–36.0)
MCV: 91.1 fL (ref 80.0–100.0)
Platelets: 248 10*3/uL (ref 150–400)
RBC: 4.27 MIL/uL (ref 3.87–5.11)
RDW: 14.5 % (ref 11.5–15.5)
WBC: 14.5 10*3/uL — ABNORMAL HIGH (ref 4.0–10.5)
nRBC: 0 % (ref 0.0–0.2)

## 2022-03-04 LAB — BASIC METABOLIC PANEL
Anion gap: 8 (ref 5–15)
BUN: 12 mg/dL (ref 8–23)
CO2: 29 mmol/L (ref 22–32)
Calcium: 10.1 mg/dL (ref 8.9–10.3)
Chloride: 103 mmol/L (ref 98–111)
Creatinine, Ser: <0.3 mg/dL — ABNORMAL LOW (ref 0.44–1.00)
Glucose, Bld: 117 mg/dL — ABNORMAL HIGH (ref 70–99)
Potassium: 4.4 mmol/L (ref 3.5–5.1)
Sodium: 140 mmol/L (ref 135–145)

## 2022-03-04 NOTE — ED Triage Notes (Signed)
Ambulatory to ED with c/o UTI x 1.5 weeks. States shes having dysuria, retention, and bloody urine. States pain is not going into there back.

## 2022-03-04 NOTE — ED Provider Notes (Signed)
[facility] DEPT Provider Note   CSN: [id number] Arrival date & time: 03/04/22  1832     History  Chief Complaint  Patient presents with   Urinary Tract Infection    Jessica Burke is a 61 y.o. female who presents to the ED complaining of concerns for an UTI onset 1 week.  Patient denies past medical history of UTI or kidney stones.  Has associated suprapubic abdominal pain, dysuria, frequency, left-sided flank pain, hematuria, urgency.  Notes that she has tried cranberry pills, increase hydration, Azo with mild relief of her symptoms.  Denies fever, nausea, vomiting.   The history is provided by the patient. No language interpreter was used.       Home Medications Prior to Admission medications   Medication Sig Start Date End Date Taking? Authorizing Provider  cefpodoxime (VANTIN) 200 MG tablet Take 1 tablet (200 mg total) by mouth 2 (two) times daily for 14 days. 03/05/22 03/19/22 Yes Bryona Foxworthy A, PA-C  ondansetron (ZOFRAN) 4 MG tablet Take 1 tablet (4 mg total) by mouth every 8 (eight) hours as needed for nausea or vomiting. 03/05/22  Yes Neftaly Swiss A, PA-C  acetaminophen (TYLENOL) 500 MG tablet Take 1 tablet (500 mg total) by mouth every 8 (eight) hours. Patient not taking: Reported on 01/25/2022 09/25/19   Ainsley Spinner, PA-C  ACTHAR 80 UNIT/ML injectable gel Inject 80 Units into the skin once a week. Friday 04/20/19   [provider]  APPLE CIDER VINEGAR PO Take 2 capsules by mouth daily. Gummies    [provider]  ascorbic acid (VITAMIN C) 500 MG tablet Take 1 tablet (500 mg total) by mouth daily. 09/26/19   Ainsley Spinner, PA-C  Cholecalciferol (VITAMIN D) 125 MCG (5000 UT) CAPS Take 1 capsule by mouth daily. Patient taking differently: Take 5,000 Units by mouth daily. 09/25/19   Ainsley Spinner, PA-C  denosumab (PROLIA) 60 MG/ML SOSY injection Inject 60 mg into the skin every 6 (six) months. Patient not taking: Reported on 01/25/2022     [provider]  metoprolol succinate (TOPROL-XL) 25 MG 24 hr tablet TAKE 1/2 TABLET BY MOUTH ONCE DAILY 02/22/22   Cantwell, Celeste C, PA-C  Multiple Vitamin (MULTIVITAMIN) tablet Take 1 tablet by mouth daily.    [provider]  Omega-3 Fatty Acids (OMEGA-3 2100 PO)     [provider]  riTUXimab (RITUXAN IV) Inject into the vein every 6 (six) months. Patient not taking: Reported on 01/25/2022    [provider]  telmisartan-hydrochlorothiazide (MICARDIS HCT) 40-12.5 MG tablet Take 1 tablet by mouth daily. 12/05/20   [provider]  Turmeric POWD See admin instructions.    [provider]  Zinc Oxide (TRIPLE PASTE) 12.8 % ointment Apply 1 application topically daily as needed for irritation. Patient not taking: Reported on 01/25/2022    [provider]  pantoprazole (PROTONIX) 40 MG tablet Take 1 tablet (40 mg total) by mouth daily. 01/24/17 04/30/19  Geradine Girt, DO      Allergies    Sertraline    Review of Systems   Review of Systems  Constitutional:  Negative for fever.  Gastrointestinal:  Positive for abdominal pain (suprapubic). Negative for nausea and vomiting.  Genitourinary:  Positive for dysuria, flank pain, frequency, hematuria and urgency.  All other systems reviewed and are negative.   Physical Exam Updated Vital Signs BP 138/89 (BP Location: Left Arm)   Pulse 100   Temp 99.1 F (37.3 C) (Oral)  Resp 16   Ht '5\' 3"'$  (1.6 m)   Wt 61.2 kg   LMP 11/26/2012   SpO2 100%   BMI 23.90 kg/m  Physical Exam Vitals and nursing note reviewed.  Constitutional:      General: She is not in acute distress.    Appearance: She is not diaphoretic.  HENT:     Head: Normocephalic and atraumatic.     Mouth/Throat:     Pharynx: No oropharyngeal exudate.  Eyes:     General: No scleral icterus.    Conjunctiva/sclera: Conjunctivae normal.  Cardiovascular:     Rate and Rhythm: Normal rate and regular rhythm.     Pulses:  Normal pulses.     Heart sounds: Normal heart sounds.  Pulmonary:     Effort: Pulmonary effort is normal. No respiratory distress.     Breath sounds: Normal breath sounds. No wheezing.  Abdominal:     General: Bowel sounds are normal.     Palpations: Abdomen is soft. There is no mass.     Tenderness: There is abdominal tenderness in the suprapubic area. There is left CVA tenderness. There is no right CVA tenderness, guarding or rebound.     Comments: Mild suprapubic abdominal tenderness to palpation. Left CVA tenderness to palpation.   Musculoskeletal:        General: Normal range of motion.     Cervical back: Normal range of motion and neck supple.  Skin:    General: Skin is warm and dry.  Neurological:     Mental Status: She is alert.  Psychiatric:        Behavior: Behavior normal.     ED Results / Procedures / Treatments   Labs (all labs ordered are listed, but only abnormal results are displayed) Labs Reviewed  URINALYSIS, ROUTINE W REFLEX MICROSCOPIC - Abnormal; Notable for the following components:      Result Value   APPearance TURBID (*)    Hgb urine dipstick LARGE (*)    Ketones, ur 20 (*)    Protein, ur 100 (*)    Leukocytes,Ua LARGE (*)    RBC / HPF >50 (*)    WBC, UA >50 (*)    Bacteria, UA MANY (*)    Non Squamous Epithelial 0-5 (*)    All other components within normal limits  BASIC METABOLIC PANEL - Abnormal; Notable for the following components:   Glucose, Bld 117 (*)    Creatinine, Ser <0.30 (*)    All other components within normal limits  CBC - Abnormal; Notable for the following components:   WBC 14.5 (*)    All other components within normal limits  URINE CULTURE    EKG None  Radiology CT Renal Stone Study  Result Date: 03/05/2022 CLINICAL DATA:  Dysuria hematuria EXAM: CT ABDOMEN AND PELVIS WITHOUT CONTRAST TECHNIQUE: Multidetector CT imaging of the abdomen and pelvis was performed following the standard protocol without IV contrast.  RADIATION DOSE REDUCTION: This exam was performed according to the departmental dose-optimization program which includes automated exposure control, adjustment of the mA and/or kV according to patient size and/or use of iterative reconstruction technique. COMPARISON:  Pelvic ultrasound 04/27/2021 FINDINGS: Lower chest: Lung bases demonstrate mild lower lobe bronchiectasis. Mild scarring at the bases. No acute airspace disease. Hepatobiliary: 11 mm hypodensity in the left hepatic lobe likely a cyst. Status post cholecystectomy. No biliary dilatation. Pancreas: Unremarkable. No pancreatic ductal dilatation or surrounding inflammatory changes. Spleen: 2 cm low-density lesion at the superior spleen with density  values suggesting a cyst. No specific imaging follow-up is recommended. Adrenals/Urinary Tract: Adrenal glands are normal. Moderate bilateral perinephric fat stranding. Mild bilateral hydronephrosis and hydroureter with suspected urothelial thickening throughout. Stranding about the renal pelvis left greater than right. No obstructing stones. The urinary bladder is grossly unremarkable Stomach/Bowel: The stomach is nonenlarged. There is no dilated small bowel. Negative appendix. Diverticular disease of the left colon without acute inflammatory process. Vascular/Lymphatic: Moderate aortic atherosclerosis. No aneurysm. No suspicious lymph nodes. Reproductive: Extensive uterine calcifications presumably due to calcified fibroids and history of prior embolization therapy. 4.3 cm oval soft tissue mass in the right pelvis probably represents non calcified fibroid versus adnexal mass. Other: Negative for pelvic effusion or free air. Musculoskeletal: Extensive fatty atrophy of the paraspinal, gluteal and hip muscles. Chronic appearing wedge deformity at L1. IMPRESSION: 1. Moderate perinephric fat stranding. Mild bilateral hydronephrosis and hydroureter without obstructing stone. Soft tissue stranding about both ureters  and the renal pelvises left greater than right, question if findings are secondary to ascending/upper urinary tract infection. 2. Extensive uterine calcifications, consistent with fibroids and history of prior embolization therapy. Noncalcified oval soft tissue mass in the right pelvis measuring up to 4.3 cm, probably reflects non calcified fibroid, less likely adnexal lesion. This may correspond to the ovoid soft tissue mass on ultrasound from 2022. Consider nonemergent follow-up pelvic ultrasound. Electronically Signed   By: Donavan Foil M.D.   On: 03/05/2022 01:25    Procedures Procedures    Medications Ordered in ED Medications  cefTRIAXone (ROCEPHIN) 1 g in sodium chloride 0.9 % 100 mL IVPB (0 g Intravenous Stopped 03/05/22 0316)    ED Course/ Medical Decision Making/ A&P Clinical Course as of 03/05/22 0336  Mon Mar 05, 2022  0153 Re-evaluated and discussed with patient lab and imaging findings.  Discussed treatment plan consisting of Rocephin in the ED as well as antibiotics at home.  Patient agreeable at this time. [SB]    Clinical Course User Index [SB] Kimberly Nieland A, PA-C                           Medical Decision Making Amount and/or Complexity of Data Reviewed Labs: ordered.  Risk Prescription drug management.   Patient with concerns for UTI onset 1 week.  Has dysuria, frequency, urgency, hematuria, left-sided flank pain.  Has tried cranberry pills, increasing hydration, Azo with mild relief of her symptoms.  Denies fever, nausea, vomiting.  Patient afebrile.  On exam patient with mild suprapubic abdominal tenderness to palpation.  Left CVA tenderness to palpation.  No acute cardiovascular respiratory exam findings.  Differential diagnosis includes acute cystitis, pyelonephritis, nephrolithiasis.    Co morbidities that complicate the patient evaluation: Hypertension  Labs:  I ordered, and personally interpreted labs.  The pertinent results include:   Urinalysis  notable for turbid in appearance, large amount of hemoglobin, large amount of leukocytes, nitrate negative. BMP with slightly elevated glucose 117 otherwise unremarkable. CBC with elevated WBC of 14.5 otherwise unremarkable.   Urine culture sent.  Imaging: I ordered imaging studies including CT renal stone study I independently visualized and interpreted imaging which showed:  Moderate perinephric fat stranding. Mild bilateral hydronephrosis  and hydroureter without obstructing stone. Soft tissue stranding  about both ureters and the renal pelvises left greater than right,  question if findings are secondary to ascending/upper urinary tract  infection.   I agree with the radiologist interpretation  Medications:  I ordered medication including  Rocephin for antibiotic prophylaxis I have reviewed the patients home medicines and have made adjustments as needed  Disposition: Presentation suspicious for acute cystitis with hematuria as well as pyelonephritis.  Doubt nephrolithiasis at this time.  After consideration of the diagnostic results and the patients response to treatment, I feel that the patient would benefit from Discharge home.  Patient will be sent home with a prescription for Vantin and Zofran.  Instructed patient to follow-up with primary care provider this week regarding today's ED visit.  Supportive care measures and strict return precautions discussed with patient at bedside. Pt acknowledges and verbalizes understanding. Pt appears safe for discharge. Follow up as indicated in discharge paperwork.    This chart was dictated using voice recognition software, Dragon. Despite the best efforts of this provider to proofread and correct errors, errors may still occur which can change documentation meaning.  Final Clinical Impression(s) / ED Diagnoses Final diagnoses:  Acute cystitis with hematuria  Pyelonephritis    Rx / DC Orders ED Discharge Orders          Ordered     cefpodoxime (VANTIN) 200 MG tablet  2 times daily        03/05/22 0312    ondansetron (ZOFRAN) 4 MG tablet  Every 8 hours PRN        03/05/22 0314              Lahoma Constantin A, PA-C 03/05/22 0336    Teressa Lower, MD 03/05/22 1224

## 2022-03-04 NOTE — ED Notes (Signed)
Pt wants to wait for purewick to give urine sample

## 2022-03-04 NOTE — ED Provider Triage Note (Signed)
Emergency Medicine Provider Triage Evaluation Note  Jessica Burke , a 61 y.o. female  was evaluated in triage.  Pt complains of dysuria, hematuria, frequency and left flank pain.  She feels like she has a urinary tract infection. No hx of kidney stones   Review of Systems  Positive: Dysuria, hematuria Negative:   Physical Exam  BP 138/89 (BP Location: Left Arm)   Pulse 100   Temp 99.1 F (37.3 C) (Oral)   Resp 16   Ht '5\' 3"'$  (1.6 m)   Wt 61.2 kg   LMP 11/26/2012   SpO2 100%   BMI 23.90 kg/m  Gen:   Awake, no distress   Resp:  Normal effort  MSK:   Moves extremities without difficulty  Other:    Medical Decision Making  Medically screening exam initiated at 8:48 PM.  Appropriate orders placed.  BRALEIGH MASSOUD was informed that the remainder of the evaluation will be completed by another provider, this initial triage assessment does not replace that evaluation, and the importance of remaining in the ED until their evaluation is complete.  Dysuria, hematuria   Mailani Degroote A, PA-C 03/04/22 2049

## 2022-03-05 ENCOUNTER — Emergency Department (HOSPITAL_COMMUNITY): Payer: Medicare HMO

## 2022-03-05 DIAGNOSIS — N858 Other specified noninflammatory disorders of uterus: Secondary | ICD-10-CM | POA: Diagnosis not present

## 2022-03-05 DIAGNOSIS — D7389 Other diseases of spleen: Secondary | ICD-10-CM | POA: Diagnosis not present

## 2022-03-05 DIAGNOSIS — K5790 Diverticulosis of intestine, part unspecified, without perforation or abscess without bleeding: Secondary | ICD-10-CM | POA: Diagnosis not present

## 2022-03-05 DIAGNOSIS — N1330 Unspecified hydronephrosis: Secondary | ICD-10-CM | POA: Diagnosis not present

## 2022-03-05 MED ORDER — SODIUM CHLORIDE 0.9 % IV SOLN
1.0000 g | Freq: Once | INTRAVENOUS | Status: AC
  Administered 2022-03-05: 1 g via INTRAVENOUS
  Filled 2022-03-05: qty 10

## 2022-03-05 MED ORDER — ONDANSETRON HCL 4 MG PO TABS: 4.0000 mg | ORAL_TABLET | Freq: Three times a day (TID) | ORAL | 0 refills | Status: DC | PRN

## 2022-03-05 MED ORDER — CEFPODOXIME PROXETIL 200 MG PO TABS: 200.0000 mg | ORAL_TABLET | Freq: Two times a day (BID) | ORAL | 0 refills | Status: AC

## 2022-03-05 NOTE — Discharge Instructions (Addendum)
It was a pleasure taking care of you today!  Your urinalysis and CT scan today showed concerns for a UTI going up to the kidneys leading to pyelonephritis (inflammation of the kidneys).  You will be sent home with a prescription for Vantin, take as prescribed.  You will also be sent home a prescription for Zofran to take as directed if you are experiencing nausea or vomiting.  Ensure to maintain fluid intake with water.  Call your primary care provider to set up a follow-up appointment this week regarding today's ED visit.  Return to the ED if you are experiencing increasing/worsening fever, vomiting, abdominal pain, worsening symptoms.

## 2022-03-08 LAB — URINE CULTURE: Culture: >=100000 — AB

## 2022-03-09 ENCOUNTER — Telehealth: Payer: Self-pay

## 2022-03-09 NOTE — Telephone Encounter (Signed)
Post ED Visit - Positive Culture Follow-up  Culture report reviewed by antimicrobial stewardship pharmacist: [facility] Team '[]'$  Elenor Quinones, Pharm.D. '[]'$  Heide Guile, Pharm.D., BCPS AQ-ID '[]'$  Parks Neptune, Pharm.D., BCPS '[x]'$  Alycia Rossetti, Pharm.D., BCPS '[]'$  [place], Pharm.D., BCPS, AAHIVP '[]'$  Legrand Como, Pharm.D., BCPS, AAHIVP '[]'$  Salome Arnt, PharmD, BCPS '[]'$  Johnnette Gourd, PharmD, BCPS '[]'$  Hughes Better, PharmD, BCPS '[]'$  Leeroy Cha, PharmD '[]'$  Laqueta Linden, PharmD, BCPS '[]'$  Albertina Parr, PharmD  [facility] Team '[]'$  Leodis Sias, PharmD '[]'$  Lindell Spar, PharmD '[]'$  Royetta Asal, PharmD '[]'$  Graylin Shiver, Rph '[]'$  Rema Fendt) Glennon Mac, PharmD '[]'$  Arlyn Dunning, PharmD '[]'$  Netta Cedars, PharmD '[]'$  Dia Sitter, PharmD '[]'$  Leone Haven, PharmD '[]'$  Gretta Arab, PharmD '[]'$  Theodis Shove, PharmD '[]'$  Peggyann Juba, PharmD '[]'$  Reuel Boom, PharmD   Positive urine culture Treated with Cefpodoxime Proxetil, organism sensitive to the same and no further patient follow-up is required at this time.  Glennon Hamilton 03/09/2022, 8:50 AM

## 2022-03-26 DIAGNOSIS — M810 Age-related osteoporosis without current pathological fracture: Secondary | ICD-10-CM | POA: Diagnosis not present

## 2022-03-26 DIAGNOSIS — I2510 Atherosclerotic heart disease of native coronary artery without angina pectoris: Secondary | ICD-10-CM | POA: Diagnosis not present

## 2022-03-26 DIAGNOSIS — I10 Essential (primary) hypertension: Secondary | ICD-10-CM | POA: Diagnosis not present

## 2022-03-29 ENCOUNTER — Ambulatory Visit (HOSPITAL_COMMUNITY)
Admission: RE | Admit: 2022-03-29 | Discharge: 2022-03-29 | Disposition: A | Discharge status: Home / Self Care | Payer: Medicare HMO | Source: Ambulatory Visit | Attending: Internal Medicine | Admitting: Internal Medicine

## 2022-03-29 DIAGNOSIS — D1803 Hemangioma of intra-abdominal structures: Secondary | ICD-10-CM | POA: Diagnosis not present

## 2022-03-29 DIAGNOSIS — D7389 Other diseases of spleen: Secondary | ICD-10-CM | POA: Insufficient documentation

## 2022-03-29 DIAGNOSIS — K7689 Other specified diseases of liver: Secondary | ICD-10-CM | POA: Diagnosis not present

## 2022-03-29 DIAGNOSIS — Z9049 Acquired absence of other specified parts of digestive tract: Secondary | ICD-10-CM | POA: Diagnosis not present

## 2022-03-29 MED ORDER — GADOBUTROL 1 MMOL/ML IV SOLN
6.0000 mL | Freq: Once | INTRAVENOUS | Status: AC | PRN
  Administered 2022-03-29: 6 mL via INTRAVENOUS

## 2022-04-02 ENCOUNTER — Ambulatory Visit: Payer: Medicare HMO | Admitting: Gastroenterology

## 2022-04-02 ENCOUNTER — Encounter: Payer: Self-pay | Admitting: Gastroenterology

## 2022-04-02 VITALS — BP 122/80 | HR 88

## 2022-04-02 DIAGNOSIS — Z86010 Personal history of colonic polyps: Secondary | ICD-10-CM | POA: Diagnosis not present

## 2022-04-02 DIAGNOSIS — K7689 Other specified diseases of liver: Secondary | ICD-10-CM

## 2022-04-02 NOTE — Progress Notes (Signed)
[facility] progress note:  History: Jessica Burke 04/02/2022    Reason for consult/chief complaint: Positive Cologuard  Subjective  HPI: Jessica Burke was seen in April 2022 for positive Cologuard, having previously undergone colonoscopies with Dr. Collene Mares. She was scheduled for a colonoscopy the following month with hospital admission beforehand for bowel preparation due to her debility. Multiple colon polyps were removed, preparation was fair despite 3 L of PEG solution  [name] was here with her daughter and grandson to discuss a colonoscopy.  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  She still has limited mobility and is in a wheelchair most of the time.  Denies rectal bleeding, change in bowel habits or upper digestive symptoms.   ROS:  Review of Systems She denies chest pain dyspnea or cough Recent UTI  Past Medical History: Past Medical History:  Diagnosis Date   Anemia    Arthritis    CAD (coronary artery disease)    Chest pain 12/2016   Colon polyps    Depression    2003   Diverticulosis    GERD (gastroesophageal reflux disease)    H/O corticosteroid therapy 09/23/2019   History of abnormal cervical Pap smear    and LEEP   Hyperlipidemia    Hypertension    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[place])    Osteoporosis    Pneumonia    2016   Polymyositis ([place]) 12/2016   Pre-diabetes    Shortness of breath dyspnea    Vitamin D deficiency 09/24/2019     Past Surgical History: Past Surgical History:  Procedure Laterality Date   CESAREAN SECTION     COLONOSCOPY W/ BIOPSIES     COLONOSCOPY WITH PROPOFOL N/A 12/27/2020   Procedure: COLONOSCOPY WITH PROPOFOL;  Surgeon: Doran Stabler, MD;  [facility];  Service: Gastroenterology;  Laterality: N/A;   ENDOMETRIAL ABLATION     HEMOSTASIS CLIP PLACEMENT  12/27/2020   Procedure: HEMOSTASIS CLIP PLACEMENT;  Surgeon: Doran Stabler, MD;  [facility];   Service: Gastroenterology;;   LEEP  2006   ORIF TIBIA FRACTURE Left 09/22/2019   Procedure: OPEN REDUCTION INTERNAL FIXATION (ORIF) PROXIMAL TIBIA FRACTURE;  Surgeon: Altamese Calvin, MD;  Location: [facility];  Service: Orthopedics;  Laterality: Left;   POLYPECTOMY  12/27/2020   Procedure: POLYPECTOMY;  Surgeon: Doran Stabler, MD;  [facility];  Service: Gastroenterology;;   SUBMUCOSAL TATTOO INJECTION  12/27/2020   Procedure: SUBMUCOSAL TATTOO INJECTION;  Surgeon: Doran Stabler, MD;  [facility];  Service: Gastroenterology;;   UTERINE ARTERY EMBOLIZATION  05/19/2008     Family History: Family History  Problem Relation Age of Onset   Hypertension Mother    Osteoporosis Mother    Dementia Mother    Hypertension Father    Heart disease Father        IHSS    Coronary artery disease Maternal Grandmother    Diabetes Maternal Aunt    Breast cancer Maternal Aunt    Gallbladder disease Daughter     Social History: Social History   Socioeconomic History   Marital status: Married    Spouse name: Larion   Number of children: 1   Years of education: 14   Highest education level: Not on file  Occupational History   Occupation: retired   Occupation: disability  Tobacco Use   Smoking status: Former    Packs/day: 0.25    Years: 5.00    Total  pack years: 1.25    Types: Cigarettes    Quit date: 12/25/1988    Years since quitting: 33.2   Smokeless tobacco: Never  Vaping Use   Vaping Use: Never used  Substance and Sexual Activity   Alcohol use: No   Drug use: No   Sexual activity: Not Currently    Birth control/protection: Post-menopausal  Other Topics Concern   Not on file  Social History Narrative   Patient states she has about 4 cups of caffeine daily.   Patient is right handed.    Social Determinants of Health   Financial Resource Strain: Not on file  Food Insecurity: Not on file  Transportation Needs: No Transportation Needs (01/21/2020)    PRAPARE - Hydrologist (Medical): No    Lack of Transportation (Non-Medical): No  Physical Activity: Not on file  Stress: Not on file  Social Connections: Not on file    Allergies: Allergies  Allergen Reactions   Sertraline Other (See Comments)    Chest pain/ increase blood pressure Other reaction(s): Unknown    Outpatient Meds: Current Outpatient Medications  Medication Sig Dispense Refill   acetaminophen (TYLENOL) 500 MG tablet Take 1 tablet (500 mg total) by mouth every 8 (eight) hours. 60 tablet 0   ACTHAR 80 UNIT/ML injectable gel Inject 80 Units into the skin once a week. Friday     APPLE CIDER VINEGAR PO Take 2 capsules by mouth daily. Gummies     ascorbic acid (VITAMIN C) 500 MG tablet Take 1 tablet (500 mg total) by mouth daily. 30 tablet 1   Cholecalciferol (VITAMIN D) 125 MCG (5000 UT) CAPS Take 1 capsule by mouth daily. (Patient taking differently: Take 5,000 Units by mouth daily.) 30 capsule 3   Multiple Vitamin (MULTIVITAMIN) tablet Take 1 tablet by mouth daily.     Omega-3 Fatty Acids (OMEGA-3 2100 PO)      riTUXimab (RITUXAN IV) Inject into the vein every 6 (six) months.     telmisartan-hydrochlorothiazide (MICARDIS HCT) 40-12.5 MG tablet Take 1 tablet by mouth daily.     Turmeric POWD See admin instructions.     denosumab (PROLIA) 60 MG/ML SOSY injection Inject 60 mg into the skin every 6 (six) months. (Patient not taking: Reported on 01/25/2022)     metoprolol succinate (TOPROL-XL) 25 MG 24 hr tablet TAKE 1/2 TABLET BY MOUTH ONCE DAILY (Patient not taking: Reported on 04/02/2022) 15 tablet 3   ondansetron (ZOFRAN) 4 MG tablet Take 1 tablet (4 mg total) by mouth every 8 (eight) hours as needed for nausea or vomiting. (Patient not taking: Reported on 04/02/2022) 10 tablet 0   Zinc Oxide (TRIPLE PASTE) 12.8 % ointment Apply 1 application topically daily as needed for irritation. (Patient not taking: Reported on 01/25/2022)     No current  facility-administered medications for this visit.      ___________________________________________________________________ Objective   Exam:  BP 122/80 (BP Location: Left Arm, Patient Position: Sitting, Cuff Size: Normal)   Pulse 88   LMP 11/26/2012  Wt Readings from Last 3 Encounters:  03/04/22 134 lb 14.7 oz (61.2 kg)  01/25/22 135 lb (61.2 kg)  10/27/21 140 lb 9.6 oz (63.8 kg)    General: Decreased muscle mass as before, more so left arm and leg compared to right Eyes: sclera anicteric, no redness ENT: oral mucosa moist without lesions, no cervical or supraclavicular lymphadenopathy CV: Regular without murmur,no peripheral edema Resp: clear to auscultation bilaterally, normal RR and effort noted  GI: soft, no tenderness, with active bowel sounds. No guarding or palpable mass or hepatosplenomegaly (limited as she is in a wheelchair)  Labs: Pathology results from May 2022 colonoscopy The perianal and digital rectal examinations were normal. - Repeat examination of right colon under NBI performed. - Two flat and sessile polyps were found in the ascending colon. The polyps were 4 to 6 mm in size. These polyps were removed with a cold snare. Resection and retrieval were complete. - Multiple diverticula were found in the left colon and right colon. - Normal mucosa was found in the entire colon. Biopsies for histology were taken with a cold forceps from the right colon and left colon for evaluation of microscopic colitis. - The exam was otherwise without abnormality on direct and retroflexion views.  Recommendation was for a 1 year recall __________________________________________________   Radiology:  CLINICAL DATA:  Follow-up splenic lesion seen on prior CT.   EXAM: MRI ABDOMEN WITHOUT AND WITH CONTRAST   TECHNIQUE: Multiplanar multisequence MR imaging of the abdomen was performed both before and after the administration of intravenous contrast.   CONTRAST:  32m  GADAVIST GADOBUTROL 1 MMOL/ML IV SOLN   COMPARISON:  CT heart January 25, 2022 and CT abdomen pelvis March 05, 2022.   FINDINGS: Lower chest: No acute abnormality.   Hepatobiliary: No significant hepatic steatosis. Scattered nonenhancing fluid signal lesions measure up to 13 mm on image 14/4 consistent with benign hepatic cysts. Well-circumscribed T2 hyperintense 5 mm lesion along the posterior right lobe of the liver on image 15/4 demonstrates peripheral nodular enhancement with filling in of the lesion on delayed postcontrast pulse sequences similar in intensity to background aorta consistent with a benign hepatic hemangioma.   Gallbladder surgically absent.  No biliary ductal dilation.   Pancreas: No pancreatic ductal dilation or evidence of acute inflammation. No cystic or solid hyperenhancing pancreatic lesion identified.   Spleen: Fluid signal 2.2 cm lesion on image 10/4 demonstrates thin internal enhancing septations consistent with a benign lymphangioma.   Adrenals/Urinary Tract: No suspicious masses identified. No evidence of hydronephrosis.   Stomach/Bowel: Limited evaluation reveals no acute abnormality.   Vascular/Lymphatic: Normal caliber abdominal aorta. The portal, splenic and superior mesenteric veins are patent. No pathologically enlarged abdominal lymph nodes.   Other:  No significant abdominal free fluid.   Musculoskeletal: No suspicious bone lesions identified.   IMPRESSION: 1. 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 2. Benign 5 mm hepatic hemangioma in the posterior right lobe of the liver. 3. Benign bilobar hepatic cysts measure up 13 mm.     Electronically Signed   By: JDahlia BailiffM.D.   On: 03/30/2022 10:49   Assessment: Encounter Diagnoses  Name Primary?   Personal history of colonic polyps Yes   Hepatic cyst    Benign findings on recent MRI  She is due for a surveillance colonoscopy due to previous  findings and fair preparation. As before, Regla requires hospital admission for bowel preparation today prior to procedure.  She and her family were agreeable to that, and we will contact her with October schedule when it is available (my September [place] block is full).  Thank you for the courtesy of this consult.  Please call me with any questions or concerns.  HNelida MeuseIII  CC: Referring provider noted above

## 2022-04-02 NOTE — Patient Instructions (Signed)
_______________________________________________________  If you are age 63 or older, your body mass index should be between 23-30. Your There is no height or weight on file to calculate BMI. If this is out of the aforementioned range listed, please consider follow up with your Primary Care Provider.  If you are age 60 or younger, your body mass index should be between 19-25. Your There is no height or weight on file to calculate BMI. If this is out of the aformentioned range listed, please consider follow up with your Primary Care Provider.   ________________________________________________________  The [facility] providers would like to encourage you to use [facility] to communicate with providers for non-urgent requests or questions.  Due to long hold times on the telephone, sending your provider a message by [facility] may be a faster and more efficient way to get a response.  Please allow 48 business hours for a response.  Please remember that this is for non-urgent requests.  _______________________________________________________   We will contact you soon regarding your hospital procedure date.   Thank you for entrusting me with your care and choosing [facility].  Dr. Loletha Carrow

## 2022-04-26 DIAGNOSIS — M810 Age-related osteoporosis without current pathological fracture: Secondary | ICD-10-CM | POA: Diagnosis not present

## 2022-04-26 DIAGNOSIS — I2510 Atherosclerotic heart disease of native coronary artery without angina pectoris: Secondary | ICD-10-CM | POA: Diagnosis not present

## 2022-04-26 DIAGNOSIS — I10 Essential (primary) hypertension: Secondary | ICD-10-CM | POA: Diagnosis not present

## 2022-05-15 ENCOUNTER — Emergency Department (HOSPITAL_BASED_OUTPATIENT_CLINIC_OR_DEPARTMENT_OTHER)
Admission: EM | Admit: 2022-05-15 | Discharge: 2022-05-15 | Disposition: A | Discharge status: Home / Self Care | Payer: Medicare HMO | Attending: Emergency Medicine | Admitting: Emergency Medicine

## 2022-05-15 ENCOUNTER — Encounter (HOSPITAL_BASED_OUTPATIENT_CLINIC_OR_DEPARTMENT_OTHER): Payer: Self-pay

## 2022-05-15 ENCOUNTER — Emergency Department (HOSPITAL_BASED_OUTPATIENT_CLINIC_OR_DEPARTMENT_OTHER): Payer: Medicare HMO

## 2022-05-15 DIAGNOSIS — Z20822 Contact with and (suspected) exposure to covid-19: Secondary | ICD-10-CM | POA: Insufficient documentation

## 2022-05-15 DIAGNOSIS — R051 Acute cough: Secondary | ICD-10-CM | POA: Insufficient documentation

## 2022-05-15 DIAGNOSIS — R059 Cough, unspecified: Secondary | ICD-10-CM | POA: Diagnosis not present

## 2022-05-15 DIAGNOSIS — Z87891 Personal history of nicotine dependence: Secondary | ICD-10-CM | POA: Insufficient documentation

## 2022-05-15 DIAGNOSIS — I10 Essential (primary) hypertension: Secondary | ICD-10-CM | POA: Insufficient documentation

## 2022-05-15 DIAGNOSIS — I2510 Atherosclerotic heart disease of native coronary artery without angina pectoris: Secondary | ICD-10-CM | POA: Diagnosis not present

## 2022-05-15 DIAGNOSIS — R0602 Shortness of breath: Secondary | ICD-10-CM | POA: Diagnosis not present

## 2022-05-15 DIAGNOSIS — U071 COVID-19: Secondary | ICD-10-CM | POA: Diagnosis not present

## 2022-05-15 LAB — SARS CORONAVIRUS 2 BY RT PCR: SARS Coronavirus 2 by RT PCR: NEGATIVE

## 2022-05-15 NOTE — ED Triage Notes (Signed)
Pt states that she woke up tonight coughing, reports family member has covid. Reports SOB with lying down.

## 2022-05-15 NOTE — ED Provider Notes (Addendum)
[facility] Provider Note: Georgena Spurling, MD, FACEP  CSN: [id number] MRN: [id number] ARRIVAL: 05/15/22 at 0444 ROOM: DB012/DB012   CHIEF COMPLAINT  Cough   HISTORY OF PRESENT ILLNESS  05/15/22 4:52 AM Jessica Burke is a 61 y.o. female who woke earlier this morning with shortness of breath, worse when lying supine, and coughing.  She has been exposed to [place] by a family member.  She has baseline shortness of breath due to muscle wasting disease.  She denies chest pain.  She has not had a fever.   Past Medical History:  Diagnosis Date   Anemia    Arthritis    CAD (coronary artery disease)    Chest pain 12/2016   Colon polyps    Depression    2003   Diverticulosis    GERD (gastroesophageal reflux disease)    H/O corticosteroid therapy 09/23/2019   History of abnormal cervical Pap smear    and LEEP   Hyperlipidemia    Hypertension    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[place])    Osteoporosis    Pneumonia    2016   Polymyositis ([place]) 12/2016   Pre-diabetes    Shortness of breath dyspnea    Vitamin D deficiency 09/24/2019    Past Surgical History:  Procedure Laterality Date   CESAREAN SECTION     COLONOSCOPY W/ BIOPSIES     COLONOSCOPY WITH PROPOFOL N/A 12/27/2020   Procedure: COLONOSCOPY WITH PROPOFOL;  Surgeon: Doran Stabler, MD;  [facility];  Service: Gastroenterology;  Laterality: N/A;   ENDOMETRIAL ABLATION     HEMOSTASIS CLIP PLACEMENT  12/27/2020   Procedure: HEMOSTASIS CLIP PLACEMENT;  Surgeon: Doran Stabler, MD;  [facility];  Service: Gastroenterology;;   LEEP  2006   ORIF TIBIA FRACTURE Left 09/22/2019   Procedure: OPEN REDUCTION INTERNAL FIXATION (ORIF) PROXIMAL TIBIA FRACTURE;  Surgeon: Altamese Wagoner, MD;  Location: [facility];  Service: Orthopedics;  Laterality: Left;   POLYPECTOMY  12/27/2020   Procedure: POLYPECTOMY;  Surgeon: Doran Stabler, MD;  [facility];  Service: Gastroenterology;;   SUBMUCOSAL  TATTOO INJECTION  12/27/2020   Procedure: SUBMUCOSAL TATTOO INJECTION;  Surgeon: Doran Stabler, MD;  [facility];  Service: Gastroenterology;;   UTERINE ARTERY EMBOLIZATION  05/19/2008    Family History  Problem Relation Age of Onset   Hypertension Mother    Osteoporosis Mother    Dementia Mother    Hypertension Father    Heart disease Father        IHSS    Coronary artery disease Maternal Grandmother    Diabetes Maternal Aunt    Breast cancer Maternal Aunt    Gallbladder disease Daughter     Social History   Tobacco Use   Smoking status: Former    Packs/day: 0.25    Years: 5.00    Total pack years: 1.25    Types: Cigarettes    Quit date: 12/25/1988    Years since quitting: 33.4   Smokeless tobacco: Never  Vaping Use   Vaping Use: Never used  Substance Use Topics   Alcohol use: No   Drug use: No    Prior to Admission medications   Medication Sig Start Date End Date Taking? Authorizing Provider  acetaminophen (TYLENOL) 500 MG tablet Take 1 tablet (500 mg total) by mouth every 8 (eight) hours. 09/25/19   Ainsley Spinner, PA-C  ACTHAR 80 UNIT/ML injectable gel Inject 80 Units into the skin once a  week. Friday 04/20/19   [provider]  APPLE CIDER VINEGAR PO Take 2 capsules by mouth daily. Gummies    [provider]  ascorbic acid (VITAMIN C) 500 MG tablet Take 1 tablet (500 mg total) by mouth daily. 09/26/19   Ainsley Spinner, PA-C  Cholecalciferol (VITAMIN D) 125 MCG (5000 UT) CAPS Take 1 capsule by mouth daily. Patient taking differently: Take 5,000 Units by mouth daily. 09/25/19   Ainsley Spinner, PA-C  Multiple Vitamin (MULTIVITAMIN) tablet Take 1 tablet by mouth daily.    [provider]  Omega-3 Fatty Acids (OMEGA-3 2100 PO)     [provider]  riTUXimab (RITUXAN IV) Inject into the vein every 6 (six) months.    [provider]  telmisartan-hydrochlorothiazide (MICARDIS HCT) 40-12.5 MG tablet Take 1 tablet by mouth  daily. 12/05/20   [provider]  Turmeric POWD See admin instructions.    [provider]  pantoprazole (PROTONIX) 40 MG tablet Take 1 tablet (40 mg total) by mouth daily. 01/24/17 04/30/19  Geradine Girt, DO    Allergies Sertraline   REVIEW OF SYSTEMS  Negative except as noted here or in the History of Present Illness.   PHYSICAL EXAMINATION  Initial Vital Signs Blood pressure (!) 154/97, pulse (!) 115, temperature 98.2 F (36.8 C), temperature source Oral, resp. rate 18, last menstrual period 11/26/2012, SpO2 100 %.  Examination General: Well-developed, thin female in no acute distress; appearance consistent with age of record HENT: normocephalic; atraumatic Eyes: Normal appearance Neck: supple Heart: regular rate and rhythm Lungs: Sounds Abdomen: soft; nondistended; nontender; bowel sounds present Extremities: Arthritic changes; generalized muscle wasting Neurologic: Awake, alert and oriented; motor function intact in all extremities and symmetric; no facial droop Skin: Warm and dry; prominent dermatosis papulosa nigra Psychiatric: Normal mood and affect   RESULTS  Summary of this visit's results, reviewed and interpreted by myself:   EKG Interpretation  Date/Time:    Ventricular Rate:    PR Interval:    QRS Duration:   QT Interval:    QTC Calculation:   R Axis:     Text Interpretation:         Laboratory Studies: Results for orders placed or performed during the hospital encounter of 05/15/22 (from the past 24 hour(s))  SARS Coronavirus 2 by RT PCR (hospital order, performed in [facility] hospital lab) *cepheid single result test* Anterior Nasal Swab     Status: None   Collection Time: 05/15/22  4:50 AM   Specimen: Anterior Nasal Swab  Result Value Ref Range   SARS Coronavirus 2 by RT PCR NEGATIVE NEGATIVE   Imaging Studies: DG Chest Port 1 View  Result Date: 05/15/2022 CLINICAL DATA:  Cough and shortness of breath. EXAM: PORTABLE  CHEST 1 VIEW COMPARISON:  01/25/2022 FINDINGS: Low volume chest exacerbated by kyphotic positioning. There is no edema, consolidation, effusion, or pneumothorax. Normal heart size and stable mediastinal contours. Cholecystectomy clips. Moderate gaseous distention of the stomach. IMPRESSION: Clear low volume chest. Electronically Signed   By: Jorje Guild M.D.   On: 05/15/2022 05:33    ED COURSE and MDM  Nursing notes, initial and subsequent vitals signs, including pulse oximetry, reviewed and interpreted by myself.  Vitals:   05/15/22 0451  BP: (!) 154/97  Pulse: (!) 115  Resp: 18  Temp: 98.2 F (36.8 C)  TempSrc: Oral  SpO2: 100%   Medications - No data to display  5:38 AM Patient negative for COVID.  Chest x-ray is  clear although volumes are low but this is likely due in part to the patient's muscle disease.  The patient does not wish any prescriptions but she was advised to return if symptoms worsen.  Symptoms may be due to a viral illness although she is not having nasal congestion, sore throat or fever at this point.  PROCEDURES  Procedures   ED DIAGNOSES     ICD-10-CM   1. Acute cough  R05.1     2. COVID-19 virus not detected  Y70.623          Shanon Rosser, MD 05/15/22 0540    Shanon Rosser, MD 05/15/22 [phone]

## 2022-06-13 ENCOUNTER — Other Ambulatory Visit: Payer: Self-pay

## 2022-06-13 ENCOUNTER — Telehealth: Payer: Self-pay

## 2022-06-13 DIAGNOSIS — Z86010 Personal history of colonic polyps: Secondary | ICD-10-CM

## 2022-06-13 NOTE — Telephone Encounter (Signed)
Pt returned call. She states that she is available for colonoscopy appt on 08/09/22 at [facility]. Pt is aware that she will be contacted by [facility] bed placement on 08/08/22. Pt is aware that I will mail her appointment information to her. Pt verbalized understanding and had no concerns at the end of the call.  Ambulatory referral to GI in epic. Reminder in epic to contact hospital PA the week of patient's procedure. Appt information mailed to patient. Called and spoke with Dawn at [facility] bed placement. She entered request for AM bed, they will try to get patient in as soon as they can on 08/08/22.

## 2022-06-13 NOTE — Telephone Encounter (Signed)
Left patient a detailed vm on her mobile phone letting her know that Dr. Loletha Carrow has availability to perform her colonoscopy on Thursday, 08/09/22 at [facility]. I advised that patient will be admitted to [facility] the day prior to the procedure so that she can have assistance with her bowel prep for the procedure. I asked that patient give me a call back to discuss.

## 2022-07-04 DIAGNOSIS — Z79899 Other long term (current) drug therapy: Secondary | ICD-10-CM | POA: Diagnosis not present

## 2022-07-04 DIAGNOSIS — M3320 Polymyositis, organ involvement unspecified: Secondary | ICD-10-CM | POA: Diagnosis not present

## 2022-07-04 DIAGNOSIS — M810 Age-related osteoporosis without current pathological fracture: Secondary | ICD-10-CM | POA: Diagnosis not present

## 2022-07-04 DIAGNOSIS — Z7952 Long term (current) use of systemic steroids: Secondary | ICD-10-CM | POA: Diagnosis not present

## 2022-07-04 DIAGNOSIS — I10 Essential (primary) hypertension: Secondary | ICD-10-CM | POA: Diagnosis not present

## 2022-07-04 DIAGNOSIS — R7303 Prediabetes: Secondary | ICD-10-CM | POA: Diagnosis not present

## 2022-07-04 DIAGNOSIS — E785 Hyperlipidemia, unspecified: Secondary | ICD-10-CM | POA: Diagnosis not present

## 2022-07-31 ENCOUNTER — Telehealth: Payer: Self-pay | Admitting: Gastroenterology

## 2022-07-31 NOTE — Telephone Encounter (Signed)
This patient is on my [facility] endoscopy schedule for next Thursday, December 14th.  Due to her mobility limitations, the plan was for admission to the hospital the day prior in order for her to undergo bowel preparation.  Due to limited inpatient bed availability for the last few months and for the foreseeable future, many patients in the emergency departments and those awaiting transfer from outside hospitals have prolonged wait for beds.  As a result, we cannot accommodate an elective admission for bowel preparation prior to her procedure.  If Jessica Burke can get the help and support of her family to assist her with a bowel preparation at home the evening before her procedure, then I could still do her colonoscopy as an outpatient as scheduled on 08/09/2022.  If not, then we are not able to accommodate her colonoscopy at this time.  Please contact her with this information and tell her that we would need an answer from her by midday on 08/01/2022 with whether or not she feels she could prep at home for the outpatient colonoscopy.  (If so, she would need [employer] bowel prep instructions from [place])  If she is unable to do so, I certainly understand, and we would then need time to offer that slot to other patients on our waiting list.   - HD

## 2022-08-01 ENCOUNTER — Telehealth: Payer: Self-pay | Admitting: Gastroenterology

## 2022-08-01 ENCOUNTER — Encounter (HOSPITAL_COMMUNITY): Payer: Self-pay | Admitting: Gastroenterology

## 2022-08-01 NOTE — Telephone Encounter (Signed)
Pt returned call. She states that she received my voicemail, but she needs to cancel her procedure anyway because her mother is in the hospital and she is not doing to well. Pt is aware that we will place her back on the hospital wait list and it will likely be 2024 before we can get her rescheduled. Pt verbalized understanding and had no concerns at the end of the call.

## 2022-08-01 NOTE — Telephone Encounter (Signed)
See 07/31/22 telephone encounter for details. 

## 2022-08-01 NOTE — Telephone Encounter (Signed)
Left patient a detailed vm asking that she give me a call back to discuss upcoming procedure. I informed her that unfortunately due to the limited availability at the hospital we will not be able to admit her a day prior to her procedure for bowel prep assistance. I asked that patient give me a call back ASAP to further discuss.

## 2022-08-01 NOTE — Telephone Encounter (Signed)
Patient is calling states she is wanting to reschedule her procedure due to her being in the hospital. Please advise

## 2022-08-02 NOTE — Telephone Encounter (Signed)
Noted, thanks!

## 2022-08-02 NOTE — Telephone Encounter (Signed)
Inbound call from patient at stating she received a call from the hospital and they provided her with further info for procedure. Advised patient that her procedure is cancelled and she is on the waiting list for 2024.  Patient advised understanding.

## 2022-08-08 ENCOUNTER — Ambulatory Visit (HOSPITAL_COMMUNITY): Admission: RE | Admit: 2022-08-08 | Payer: Medicare HMO | Source: Home / Self Care | Admitting: Gastroenterology

## 2022-08-09 ENCOUNTER — Encounter (HOSPITAL_COMMUNITY): Admission: RE | Payer: Self-pay | Source: Home / Self Care

## 2022-08-09 SURGERY — COLONOSCOPY WITH PROPOFOL
Anesthesia: Monitor Anesthesia Care

## 2022-10-25 DIAGNOSIS — R7303 Prediabetes: Secondary | ICD-10-CM | POA: Diagnosis not present

## 2022-10-25 DIAGNOSIS — M810 Age-related osteoporosis without current pathological fracture: Secondary | ICD-10-CM | POA: Diagnosis not present

## 2022-10-25 DIAGNOSIS — I10 Essential (primary) hypertension: Secondary | ICD-10-CM | POA: Diagnosis not present

## 2022-11-10 ENCOUNTER — Other Ambulatory Visit: Payer: Self-pay

## 2022-11-10 ENCOUNTER — Emergency Department (HOSPITAL_COMMUNITY): Payer: Medicare HMO

## 2022-11-10 ENCOUNTER — Emergency Department (HOSPITAL_COMMUNITY)
Admission: EM | Admit: 2022-11-10 | Discharge: 2022-11-10 | Disposition: A | Discharge status: Home / Self Care | Payer: Medicare HMO | Attending: Emergency Medicine | Admitting: Emergency Medicine

## 2022-11-10 DIAGNOSIS — R0602 Shortness of breath: Secondary | ICD-10-CM | POA: Diagnosis not present

## 2022-11-10 DIAGNOSIS — I2510 Atherosclerotic heart disease of native coronary artery without angina pectoris: Secondary | ICD-10-CM | POA: Diagnosis not present

## 2022-11-10 DIAGNOSIS — R079 Chest pain, unspecified: Secondary | ICD-10-CM

## 2022-11-10 DIAGNOSIS — Z79899 Other long term (current) drug therapy: Secondary | ICD-10-CM | POA: Insufficient documentation

## 2022-11-10 DIAGNOSIS — I10 Essential (primary) hypertension: Secondary | ICD-10-CM | POA: Diagnosis not present

## 2022-11-10 DIAGNOSIS — R0789 Other chest pain: Secondary | ICD-10-CM | POA: Diagnosis not present

## 2022-11-10 DIAGNOSIS — R002 Palpitations: Secondary | ICD-10-CM | POA: Insufficient documentation

## 2022-11-10 LAB — CBC WITH DIFFERENTIAL/PLATELET
Abs Immature Granulocytes: 0.02 10*3/uL (ref 0.00–0.07)
Basophils Absolute: 0 10*3/uL (ref 0.0–0.1)
Basophils Relative: 1 %
Eosinophils Absolute: 0.1 10*3/uL (ref 0.0–0.5)
Eosinophils Relative: 1 %
HCT: 36.4 % (ref 36.0–46.0)
Hemoglobin: 11.6 g/dL — ABNORMAL LOW (ref 12.0–15.0)
Immature Granulocytes: 0 %
Lymphocytes Relative: 23 %
Lymphs Abs: 1.3 10*3/uL (ref 0.7–4.0)
MCH: 28.5 pg (ref 26.0–34.0)
MCHC: 31.9 g/dL (ref 30.0–36.0)
MCV: 89.4 fL (ref 80.0–100.0)
Monocytes Absolute: 0.3 10*3/uL (ref 0.1–1.0)
Monocytes Relative: 6 %
Neutro Abs: 4.1 10*3/uL (ref 1.7–7.7)
Neutrophils Relative %: 69 %
Platelets: 201 10*3/uL (ref 150–400)
RBC: 4.07 MIL/uL (ref 3.87–5.11)
RDW: 14.5 % (ref 11.5–15.5)
WBC: 5.9 10*3/uL (ref 4.0–10.5)
nRBC: 0 % (ref 0.0–0.2)

## 2022-11-10 LAB — BASIC METABOLIC PANEL
Anion gap: 11 (ref 5–15)
BUN: 11 mg/dL (ref 8–23)
CO2: 26 mmol/L (ref 22–32)
Calcium: 9.4 mg/dL (ref 8.9–10.3)
Chloride: 100 mmol/L (ref 98–111)
Creatinine, Ser: 0.43 mg/dL — ABNORMAL LOW (ref 0.44–1.00)
GFR, Estimated: >60 mL/min (ref >60)
Glucose, Bld: 104 mg/dL — ABNORMAL HIGH (ref 70–99)
Potassium: 3.7 mmol/L (ref 3.5–5.1)
Sodium: 137 mmol/L (ref 135–145)

## 2022-11-10 LAB — TROPONIN I (HIGH SENSITIVITY)
Troponin I (High Sensitivity): 2 ng/L (ref <18)
Troponin I (High Sensitivity): <2 ng/L (ref <18)

## 2022-11-10 NOTE — ED Notes (Signed)
AVS reviewed with pt prior to discharge. Pt verbalizes understanding. Belongings with pt upon depart. Pt taken to POV via wheelchair with family driving home.

## 2022-11-10 NOTE — ED Triage Notes (Addendum)
Pt BIB EMS from home. Per EMS, pt started having central chest pain starting at 0400 this AM. Pain does not radiate. Pt also c/o SOB and palpitations. 324mg  of aspirin given en route. Pain relieved after aspirin given. A/Ox4

## 2022-11-10 NOTE — Discharge Instructions (Addendum)
You were seen in the emergency department today for chest pain. Your work up here has been very reassuring. Please return for worsening symptoms of chest pain, shortness of breath or feeling of your heart racing.

## 2022-11-10 NOTE — ED Provider Notes (Signed)
[facility] Provider Note   CSN: [id number] Arrival date & time: 11/10/22  1051     History  Chief Complaint  Patient presents with   Chest Pain    Jessica Burke is a 62 y.o. female.  With past medical history of hypertension, polymyositis, muscular dystrophy, osteoporosis, CAD, hyperlipidemia who presents to the emergency department with chest pain.  Patient states that symptoms began around 4 AM.  She woke up from sleep having central chest tightness and feeling short of breath.  She states that she called her grandson to help her get up to go to the restroom.  She states that briefly symptoms went away and then they returned again having central chest pain and shortness of breath and feeling palpitations.  At that time she called EMS.  They gave her aspirin which improved her symptoms.  She is currently chest pain-free.  She denies having nausea or diaphoresis or lightheadedness.   Chest Pain Associated symptoms: palpitations and shortness of breath        Home Medications Prior to Admission medications   Medication Sig Start Date End Date Taking? Authorizing Provider  acetaminophen (TYLENOL) 500 MG tablet Take 1 tablet (500 mg total) by mouth every 8 (eight) hours. 09/25/19   Ainsley Spinner, PA-C  ACTHAR 80 UNIT/ML injectable gel Inject 80 Units into the skin once a week. Friday 04/20/19   [provider]  APPLE CIDER VINEGAR PO Take 2 capsules by mouth daily. Gummies    [provider]  ascorbic acid (VITAMIN C) 500 MG tablet Take 1 tablet (500 mg total) by mouth daily. 09/26/19   Ainsley Spinner, PA-C  Cholecalciferol (VITAMIN D) 125 MCG (5000 UT) CAPS Take 1 capsule by mouth daily. Patient taking differently: Take 5,000 Units by mouth daily. 09/25/19   Ainsley Spinner, PA-C  Multiple Vitamin (MULTIVITAMIN) tablet Take 1 tablet by mouth daily.    [provider]  Omega-3 Fatty Acids (OMEGA-3 2100 PO)     [provider]  riTUXimab (RITUXAN IV) Inject into the vein every 6 (six) months.    [provider]  telmisartan-hydrochlorothiazide (MICARDIS HCT) 40-12.5 MG tablet Take 1 tablet by mouth daily. 12/05/20   [provider]  Turmeric POWD See admin instructions.    [provider]  pantoprazole (PROTONIX) 40 MG tablet Take 1 tablet (40 mg total) by mouth daily. 01/24/17 04/30/19  Geradine Girt, DO      Allergies    Sertraline    Review of Systems   Review of Systems  Respiratory:  Positive for chest tightness and shortness of breath.   Cardiovascular:  Positive for chest pain and palpitations.  All other systems reviewed and are negative.   Physical Exam Updated Vital Signs BP 104/77 (BP Location: Right Arm)   Pulse 78   Temp 97.6 F (36.4 C) (Oral)   Resp 18   Ht 5\' 3"  (1.6 m)   Wt 53.5 kg   LMP 11/26/2012   SpO2 100%   BMI 20.90 kg/m  Physical Exam Vitals and nursing note reviewed.  Constitutional:      General: She is not in acute distress.    Appearance: Normal appearance. She is well-developed. She is not ill-appearing or toxic-appearing.  HENT:     Head: Normocephalic and atraumatic.  Eyes:     General: No scleral icterus.    Extraocular Movements: Extraocular movements intact.     Pupils: Pupils are equal, round,  and reactive to light.  Neck:     Vascular: No JVD.  Cardiovascular:     Rate and Rhythm: Normal rate and regular rhythm.     Pulses:          Carotid pulses are 1+ on the right side and 1+ on the left side.    Heart sounds: Normal heart sounds. No murmur heard. Pulmonary:     Effort: Pulmonary effort is normal. No tachypnea.     Breath sounds: Normal breath sounds.  Chest:     Chest wall: No tenderness.  Abdominal:     General: Bowel sounds are normal.     Palpations: Abdomen is soft.  Musculoskeletal:     Cervical back: Normal range of motion.     Right lower leg: No edema.     Left lower leg: No edema.  Skin:     General: Skin is warm and dry.     Capillary Refill: Capillary refill takes less than 2 seconds.  Neurological:     General: No focal deficit present.     Mental Status: She is alert and oriented to person, place, and time.  Psychiatric:        Mood and Affect: Mood normal.        Behavior: Behavior normal.     ED Results / Procedures / Treatments   Labs (all labs ordered are listed, but only abnormal results are displayed) Labs Reviewed  BASIC METABOLIC PANEL - Abnormal; Notable for the following components:      Result Value   Glucose, Bld 104 (*)    Creatinine, Ser 0.43 (*)    All other components within normal limits  CBC WITH DIFFERENTIAL/PLATELET - Abnormal; Notable for the following components:   Hemoglobin 11.6 (*)    All other components within normal limits  TROPONIN I (HIGH SENSITIVITY)  TROPONIN I (HIGH SENSITIVITY)    EKG EKG Interpretation  Date/Time:  Saturday November 10 2022 11:03:21 EDT Ventricular Rate:  79 PR Interval:  174 QRS Duration: 84 QT Interval:  374 QTC Calculation: 429 R Axis:   -12 Text Interpretation: Sinus rhythm Atrial premature complexes Abnormal R-wave progression, early transition Left ventricular hypertrophy Confirmed by Fredia Sorrow [phone]) on 11/10/2022 11:11:25 AM  Radiology DG Chest 2 View  Result Date: 11/10/2022 CLINICAL DATA:  Chest pain. EXAM: CHEST - 2 VIEW COMPARISON:  05/15/2022, 01/25/2022, 01/16/2021 FINDINGS: Lungs are hypoinflated without focal airspace consolidation or effusion. Cardiomediastinal silhouette and remainder of the exam is unchanged. IMPRESSION: Hypoinflation without acute cardiopulmonary disease. Electronically Signed   By: Marin Olp M.D.   On: 11/10/2022 11:36    Procedures Procedures   Medications Ordered in ED Medications - No data to display  ED Course/ Medical Decision Making/ A&P   {            HEART Score: 4                Medical Decision Making Amount and/or Complexity of Data  Reviewed Labs: ordered. Radiology: ordered.  Initial Impression and Ddx 62 year old female who presents to the emergency department with chest pain Patient PMH that increases complexity of ED encounter: Muscular dystrophy, polymyositis, hypertension, hyperlipidemia, CAD Differential: Acute chest syndrome, stable angina, atypical angina, pulmonary embolism, pneumothorax, aortic dissection, pleural effusion, CHF, COPD, asthma, myocarditis, pericarditis, cardiac tamponade, chest wall pain   Interpretation of Diagnostics I independent reviewed and interpreted the labs as followed: CBC with stable anemia, BMP without electrolyte dysfunction or AKI.  Troponin  2, 2.   - I independently visualized the following imaging with scope of interpretation limited to determining acute life threatening conditions related to emergency care: Chest x-ray, which revealed hypoinflation without acute findings   Patient Reassessment and Ultimate Disposition/Management 62 year old female who presents to the emergency department with chest pain.  She is overall well-appearing, nonseptic and nontoxic appearing.  She is hemodynamically stable.  She is in no acute respiratory distress. EKG with frequent PACs.  This has been documented previously by cardiology.  They placed her on low-dose metoprolol last year for the symptoms. Will obtain ACS workup  EKG without ischemia or infarction, troponin x2 negative, doubt ACS  Does not appear fluid volume overloaded on exam, no edema on CXR, doubt CHF exacerbation  Considered but doubt PE.  Wells low risk so will defer d-dimer or CTA PE study at this time. CXR without evidence of pneumonia, pleural effusion or pneumothorax. No recent illnesses and troponin negative so doubt pericarditis or myocarditis  Symptoms inconsistent with aortic dissection  HEART Score: 4    Heart score is mildly elevated which is likely due to age, comorbidities.  However her EKG here is without new  findings.  She has 2 negative high-sensitivity troponins.  She is currently asymptomatic.  I have low suspicion for ACS at this time.  She does state that earlier today she felt anxious like she may have had a panic attack.  This certainly could be causing her symptoms.  She also has a known history of PACs and palpitations which she has today on her EKG. I discussed the case with my attending, Dr. Rogene Houston who is agreeable with discharge and pcp/cardiology follow-up. She is established cardiology patient. Given her strict return precautions but do not feel she needs admission at this time. Asymptomatic at time of discharge.   The patient has been appropriately medically screened and/or stabilized in the ED. I have low suspicion for any other emergent medical condition which would require further screening, evaluation or treatment in the ED or require inpatient management. At time of discharge the patient is hemodynamically stable and in no acute distress. I have discussed work-up results and diagnosis with patient and answered all questions. Patient is agreeable with discharge plan. We discussed strict return precautions for returning to the emergency department and they verbalized understanding.     Patient management required discussion with the following services or consulting groups:  None  Complexity of Problems Addressed Acute complicated illness or Injury  Additional Data Reviewed and Analyzed Further history obtained from: Past medical history and medications listed in the EMR, Prior ED visit notes, and Care Everywhere  Patient Encounter Risk Assessment Consideration of hospitalization  Final Clinical Impression(s) / ED Diagnoses Final diagnoses:  Nonspecific chest pain    Rx / DC Orders ED Discharge Orders     None         Mickie Hillier, PA-C 11/11/22 0716    Fredia Sorrow, MD 11/11/22 0800

## 2022-11-10 NOTE — ED Notes (Signed)
Patient transported to X-ray 

## 2022-11-12 DIAGNOSIS — D489 Neoplasm of uncertain behavior, unspecified: Secondary | ICD-10-CM | POA: Diagnosis not present

## 2022-11-12 DIAGNOSIS — Z8639 Personal history of other endocrine, nutritional and metabolic disease: Secondary | ICD-10-CM | POA: Diagnosis not present

## 2022-11-12 DIAGNOSIS — D181 Lymphangioma, any site: Secondary | ICD-10-CM | POA: Diagnosis not present

## 2022-11-12 DIAGNOSIS — Z0000 Encounter for general adult medical examination without abnormal findings: Secondary | ICD-10-CM | POA: Diagnosis not present

## 2022-11-12 DIAGNOSIS — M810 Age-related osteoporosis without current pathological fracture: Secondary | ICD-10-CM | POA: Diagnosis not present

## 2022-11-12 DIAGNOSIS — I2510 Atherosclerotic heart disease of native coronary artery without angina pectoris: Secondary | ICD-10-CM | POA: Diagnosis not present

## 2022-11-12 DIAGNOSIS — M3320 Polymyositis, organ involvement unspecified: Secondary | ICD-10-CM | POA: Diagnosis not present

## 2022-11-14 DIAGNOSIS — R079 Chest pain, unspecified: Secondary | ICD-10-CM | POA: Diagnosis not present

## 2022-11-14 DIAGNOSIS — R000 Tachycardia, unspecified: Secondary | ICD-10-CM | POA: Diagnosis not present

## 2022-11-15 ENCOUNTER — Telehealth: Payer: Self-pay

## 2022-11-15 NOTE — Telephone Encounter (Signed)
     Patient  visit on 3/16  at [facility]    Have you been able to follow up with your primary care physician? Yes   The patient was or was not able to obtain any needed medicine or equipment. Yes   Are there diet recommendations that you are having difficulty following? Na   Patient expresses understanding of discharge instructions and education provided has no other needs at this time.  Yes     [facility] [phone] [address], [place], [place] [address] Phone: [phone] Email: [email or web address]

## 2022-12-17 ENCOUNTER — Institutional Professional Consult (permissible substitution): Payer: Medicare HMO | Admitting: Plastic Surgery

## 2023-01-07 DIAGNOSIS — M3320 Polymyositis, organ involvement unspecified: Secondary | ICD-10-CM | POA: Diagnosis not present

## 2023-01-07 DIAGNOSIS — M810 Age-related osteoporosis without current pathological fracture: Secondary | ICD-10-CM | POA: Diagnosis not present

## 2023-01-07 DIAGNOSIS — I10 Essential (primary) hypertension: Secondary | ICD-10-CM | POA: Diagnosis not present

## 2023-01-07 DIAGNOSIS — E785 Hyperlipidemia, unspecified: Secondary | ICD-10-CM | POA: Diagnosis not present

## 2023-03-26 DIAGNOSIS — M810 Age-related osteoporosis without current pathological fracture: Secondary | ICD-10-CM | POA: Diagnosis not present

## 2023-03-26 DIAGNOSIS — I10 Essential (primary) hypertension: Secondary | ICD-10-CM | POA: Diagnosis not present

## 2023-05-08 ENCOUNTER — Telehealth: Payer: Self-pay | Admitting: Internal Medicine

## 2023-05-08 ENCOUNTER — Telehealth: Payer: Self-pay | Admitting: Gastroenterology

## 2023-05-08 NOTE — Telephone Encounter (Addendum)
Error

## 2023-05-08 NOTE — Telephone Encounter (Signed)
Patient called would like to reschedule a colonoscopy procedure that was canceled.

## 2023-05-08 NOTE — Telephone Encounter (Signed)
Dr. Myrtie Neither, are you OK with adding patient to 06/20/23 [facility] slot? Do you want to see her back in the office or is PV okay?

## 2023-05-09 NOTE — Telephone Encounter (Signed)
This patient's last colonoscopy was in May 2022.  For that exam, she required hospital admission the day prior for bowel preparation because of  limited mobility from her polymyositis.  Despite that, her quality of bowel preparation was poor despite Dulcolax and 3 L of MoviPrep Our plan at that time was for a repeat colonoscopy about a year later.  Unfortunately, we are no longer able to accommodate elective hospital admissions for bowel preparation due to [facility] systemwide and [facility] practice staffing and provider constraints.   Unless her family would be able to manage assisting her with an extensive at-home bowel prep (Suprep 2 days prior followed by split-dose golytely evening before/AM of procedure), we would no longer be able to accommodate an outpatient colonoscopy for her.   - H. Danis

## 2023-05-10 NOTE — Telephone Encounter (Signed)
Lm on vm for patient to return call

## 2023-05-10 NOTE — Telephone Encounter (Signed)
Pt returned call. We reviewed information below. Pt states that the 2-day at home bowel prep is something that she will have to do because she needs the colonoscopy done. Pt states that she will have her daughter and grandson help her. Pt has been scheduled for in person PV on 06/06/23 at 3:30 pm. I advised pt to bring her son or grandson with her for PV appt since it will be an extensive 2-day bowel prep. Hospital colonoscopy scheduled for 06/20/23 at 8:30 am, pt knows to arrive at [facility] by 7 am with a care partner. Pt verbalized understanding of all information and had no concerns at the end of the call.

## 2023-06-06 ENCOUNTER — Ambulatory Visit: Payer: Medicare HMO

## 2023-06-06 VITALS — Ht 63.0 in | Wt 118.0 lb

## 2023-06-06 DIAGNOSIS — Z860100 Personal history of colon polyps, unspecified: Secondary | ICD-10-CM

## 2023-06-06 MED ORDER — PEG 3350-KCL-NA BICARB-NACL 420 G PO SOLR: 4000.0000 mL | Freq: Once | ORAL | 0 refills | Status: AC

## 2023-06-06 MED ORDER — BISACODYL 5 MG PO TBEC: 5.0000 mg | DELAYED_RELEASE_TABLET | Freq: Every day | ORAL | Status: AC | PRN

## 2023-06-06 MED ORDER — NA SULFATE-K SULFATE-MG SULF 17.5-3.13-1.6 GM/177ML PO SOLN: 1.0000 | Freq: Once | ORAL | 0 refills | Status: AC

## 2023-06-06 NOTE — Progress Notes (Signed)
No egg or soy allergy known to patient  No issues known to pt with past sedation with any surgeries or procedures Patient denies ever being told they had issues or difficulty with intubation  No FH of Malignant Hyperthermia Pt is not on diet pills Pt is not on  home 02  Pt is not on blood thinners  Pt denies issues with constipation  No A fib or A flutter Have any cardiac testing pending--no Pt can not ambulate Pt denies use of chewing tobacco Discussed diabetic I weight loss medication holds Discussed NSAID holds Checked BMI Pt instructed to use [employer] or GoodRx for a price reduction on prep  Patient's chart reviewed by Cathlyn Parsons CNRA prior to previsit and patient appropriate for the [facility].  Pre visit completed and red dot placed by patient's name on their procedure day (on provider's schedule).

## 2023-06-10 ENCOUNTER — Encounter: Payer: Self-pay | Admitting: Gastroenterology

## 2023-06-13 ENCOUNTER — Encounter (HOSPITAL_COMMUNITY): Payer: Self-pay | Admitting: Gastroenterology

## 2023-06-13 ENCOUNTER — Telehealth: Payer: Self-pay

## 2023-06-13 NOTE — Telephone Encounter (Signed)
Pt was called to confirm that she is good to go for her procedure on 06/20/2023 at [facility]  Pt stated that she plans to be there. Pt verbalized understanding with all questions answered.  Routed as [employer]

## 2023-06-13 NOTE — Telephone Encounter (Signed)
Pt stated that she had to cancel her procedure due to a scheduling conflict with her transportation. Pt prep instructions that were provided to pt had the wrong date due to the  appointment desk tab stating that pt previous appointment was on 06/19/2023 and not 06/20/2023  . Sheri Facilities manager made aware. Pt was rescheduled to 07/11/2023 at 10:15 AM at [facility]. Pt made aware.  Case ID [id number]:  Augusto Gamble please assist with Prep Instructions and ambulatory referral to GI.

## 2023-06-18 NOTE — Telephone Encounter (Signed)
Spoke with patient regarding appointment on 07/11/23 at [facility].  Patient verbalized understanding of new appointment time, dates and prep instructions.  Prep instructions sent in My Chart and mailed to home address.

## 2023-06-19 ENCOUNTER — Ambulatory Visit (HOSPITAL_COMMUNITY): Admit: 2023-06-19 | Payer: Medicare HMO | Admitting: Gastroenterology

## 2023-06-20 ENCOUNTER — Encounter (HOSPITAL_COMMUNITY): Payer: Self-pay

## 2023-06-20 SURGERY — COLONOSCOPY WITH PROPOFOL
Anesthesia: Monitor Anesthesia Care

## 2023-06-20 NOTE — Plan of Care (Signed)
[facility] Tonsillectomy/Adenoidectomy, Postoperative PEDS care plan entered in error.

## 2023-07-04 ENCOUNTER — Telehealth: Payer: Self-pay

## 2023-07-04 NOTE — Telephone Encounter (Signed)
Confirmed with patient that she will be attending procedure next week 07/11/23 with Dr. Myrtie Neither.

## 2023-07-11 ENCOUNTER — Encounter (HOSPITAL_COMMUNITY)
Admission: RE | Disposition: A | Discharge status: Home / Self Care | Payer: Self-pay | Source: Home / Self Care | Attending: Gastroenterology

## 2023-07-11 ENCOUNTER — Ambulatory Visit (HOSPITAL_COMMUNITY): Payer: Medicare HMO | Admitting: Anesthesiology

## 2023-07-11 ENCOUNTER — Encounter (HOSPITAL_COMMUNITY): Payer: Self-pay | Admitting: Gastroenterology

## 2023-07-11 ENCOUNTER — Other Ambulatory Visit: Payer: Self-pay

## 2023-07-11 ENCOUNTER — Ambulatory Visit (HOSPITAL_COMMUNITY)
Admission: RE | Admit: 2023-07-11 | Discharge: 2023-07-11 | Disposition: A | Discharge status: Home / Self Care | Payer: Medicare HMO | Attending: Gastroenterology | Admitting: Gastroenterology

## 2023-07-11 DIAGNOSIS — K5730 Diverticulosis of large intestine without perforation or abscess without bleeding: Secondary | ICD-10-CM | POA: Diagnosis not present

## 2023-07-11 DIAGNOSIS — K219 Gastro-esophageal reflux disease without esophagitis: Secondary | ICD-10-CM | POA: Insufficient documentation

## 2023-07-11 DIAGNOSIS — Z87891 Personal history of nicotine dependence: Secondary | ICD-10-CM | POA: Insufficient documentation

## 2023-07-11 DIAGNOSIS — Z860101 Personal history of adenomatous and serrated colon polyps: Secondary | ICD-10-CM | POA: Diagnosis not present

## 2023-07-11 DIAGNOSIS — I10 Essential (primary) hypertension: Secondary | ICD-10-CM | POA: Diagnosis not present

## 2023-07-11 DIAGNOSIS — K635 Polyp of colon: Secondary | ICD-10-CM | POA: Insufficient documentation

## 2023-07-11 DIAGNOSIS — K6389 Other specified diseases of intestine: Secondary | ICD-10-CM | POA: Diagnosis not present

## 2023-07-11 DIAGNOSIS — D120 Benign neoplasm of cecum: Secondary | ICD-10-CM | POA: Diagnosis not present

## 2023-07-11 DIAGNOSIS — I2510 Atherosclerotic heart disease of native coronary artery without angina pectoris: Secondary | ICD-10-CM | POA: Diagnosis not present

## 2023-07-11 DIAGNOSIS — Z09 Encounter for follow-up examination after completed treatment for conditions other than malignant neoplasm: Secondary | ICD-10-CM | POA: Insufficient documentation

## 2023-07-11 DIAGNOSIS — D126 Benign neoplasm of colon, unspecified: Secondary | ICD-10-CM | POA: Diagnosis not present

## 2023-07-11 DIAGNOSIS — Z860100 Personal history of colon polyps, unspecified: Secondary | ICD-10-CM

## 2023-07-11 DIAGNOSIS — Q438 Other specified congenital malformations of intestine: Secondary | ICD-10-CM | POA: Diagnosis not present

## 2023-07-11 DIAGNOSIS — Z1211 Encounter for screening for malignant neoplasm of colon: Secondary | ICD-10-CM

## 2023-07-11 DIAGNOSIS — D125 Benign neoplasm of sigmoid colon: Secondary | ICD-10-CM | POA: Diagnosis not present

## 2023-07-11 DIAGNOSIS — K6289 Other specified diseases of anus and rectum: Secondary | ICD-10-CM | POA: Diagnosis not present

## 2023-07-11 HISTORY — PX: HEMOSTASIS CLIP PLACEMENT: SHX6857

## 2023-07-11 HISTORY — PX: POLYPECTOMY: SHX5525

## 2023-07-11 HISTORY — PX: COLONOSCOPY WITH PROPOFOL: SHX5780

## 2023-07-11 SURGERY — COLONOSCOPY WITH PROPOFOL
Anesthesia: Monitor Anesthesia Care

## 2023-07-11 MED ORDER — LIDOCAINE 2% (20 MG/ML) 5 ML SYRINGE
INTRAMUSCULAR | Status: DC | PRN
  Administered 2023-07-11: 60 mg via INTRAVENOUS

## 2023-07-11 MED ORDER — PROPOFOL 10 MG/ML IV BOLUS
INTRAVENOUS | Status: DC | PRN
  Administered 2023-07-11: 50 mg via INTRAVENOUS

## 2023-07-11 MED ORDER — PROPOFOL 500 MG/50ML IV EMUL
INTRAVENOUS | Status: DC | PRN
  Administered 2023-07-11: 125 ug/kg/min via INTRAVENOUS

## 2023-07-11 MED ORDER — SODIUM CHLORIDE 0.9% FLUSH: 10.0000 mL | Freq: Two times a day (BID) | INTRAVENOUS | Status: DC

## 2023-07-11 MED ORDER — PHENYLEPHRINE HCL (PRESSORS) 10 MG/ML IV SOLN
INTRAVENOUS | Status: DC | PRN
  Administered 2023-07-11: 160 ug via INTRAVENOUS
  Administered 2023-07-11: 80 ug via INTRAVENOUS

## 2023-07-11 MED ORDER — SODIUM CHLORIDE 0.9 % IV SOLN: INTRAVENOUS | Status: DC | PRN

## 2023-07-11 SURGICAL SUPPLY — 22 items
ELECT REM PT RETURN 9FT ADLT (ELECTROSURGICAL)
ELECTRODE REM PT RTRN 9FT ADLT (ELECTROSURGICAL) IMPLANT
FCP BXJMBJMB 240X2.8X (CUTTING FORCEPS)
FLOOR PAD 36X40 (MISCELLANEOUS) ×2
FORCEPS BIOP RAD 4 LRG CAP 4 (CUTTING FORCEPS) IMPLANT
FORCEPS BIOP RJ4 240 W/NDL (CUTTING FORCEPS)
FORCEPS BXJMBJMB 240X2.8X (CUTTING FORCEPS) IMPLANT
INJECTOR/SNARE I SNARE (MISCELLANEOUS) IMPLANT
LUBRICANT JELLY 4.5OZ STERILE (MISCELLANEOUS) IMPLANT
MANIFOLD NEPTUNE II (INSTRUMENTS) IMPLANT
NDL SCLEROTHERAPY 25GX240 (NEEDLE) IMPLANT
NEEDLE SCLEROTHERAPY 25GX240 (NEEDLE)
PAD FLOOR 36X40 (MISCELLANEOUS) ×3 IMPLANT
PROBE APC STR FIRE (PROBE) IMPLANT
PROBE INJECTION GOLD (MISCELLANEOUS)
PROBE INJECTION GOLD 7FR (MISCELLANEOUS) IMPLANT
SNARE ROTATE MED OVAL 20MM (MISCELLANEOUS) IMPLANT
SYR 50ML LL SCALE MARK (SYRINGE) IMPLANT
TRAP SPECIMEN MUCOUS 40CC (MISCELLANEOUS) IMPLANT
TUBING ENDO SMARTCAP PENTAX (MISCELLANEOUS) IMPLANT
TUBING IRRIGATION ENDOGATOR (MISCELLANEOUS) ×3 IMPLANT
WATER STERILE IRR 1000ML POUR (IV SOLUTION) IMPLANT

## 2023-07-11 NOTE — Interval H&P Note (Signed)
History and Physical Interval Note:  07/11/2023 10:30 AM  Jessica Burke  has presented today for surgery, with the diagnosis of hx of colon polyps.  The various methods of treatment have been discussed with the patient and family. After consideration of risks, benefits and other options for treatment, the patient has consented to  Procedure(s): COLONOSCOPY WITH PROPOFOL (N/A) as a surgical intervention.  The patient's history has been reviewed, patient examined, no change in status, stable for surgery.  I have reviewed the patient's chart and labs.  Questions were answered to the patient's satisfaction.     Charlie Pitter III

## 2023-07-11 NOTE — H&P (Signed)
History and Physical:  This patient presents for endoscopic testing for:   History of colon polyps 62 year old woman known to me from a colonoscopy May 2022 done for positive Cologuard test.  On that exam, she had multiple adenomatous and serrated polyps, preparation was fair despite an elective hospital admission for bowel prep. On this occasion, she underwent extensive bowel prep in the outpatient setting with the assistance of her family due to her mobility issues.  She received a Suprep followed by [facility].  With that, she believes that the quality of her bowel preparation is good. She has tendencies to constipation, denies nausea vomiting or dysphagia. Patient is otherwise without complaints or active issues today.   Past Medical History: Past Medical History:  Diagnosis Date   Anemia    Arthritis    CAD (coronary artery disease)    Chest pain 12/2016   Colon polyps    Depression    2003   Diverticulosis    GERD (gastroesophageal reflux disease)    H/O corticosteroid therapy 09/23/2019   History of abnormal cervical Pap smear    and LEEP   Hyperlipidemia    Hypertension    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HCC)    Osteoporosis    Pneumonia    2016   Polymyositis (HCC) 12/2016   Pre-diabetes    Shortness of breath dyspnea    Vitamin D deficiency 09/24/2019     Past Surgical History: Past Surgical History:  Procedure Laterality Date   CESAREAN SECTION     COLONOSCOPY W/ BIOPSIES     COLONOSCOPY WITH PROPOFOL N/A 12/27/2020   Procedure: COLONOSCOPY WITH PROPOFOL;  Surgeon: Sherrilyn Rist, MD;  Location: [facility];  Service: Gastroenterology;  Laterality: N/A;   ENDOMETRIAL ABLATION     HEMOSTASIS CLIP PLACEMENT  12/27/2020   Procedure: HEMOSTASIS CLIP PLACEMENT;  Surgeon: Sherrilyn Rist, MD;  Location: [facility];  Service: Gastroenterology;;   LEEP  2006   ORIF TIBIA FRACTURE Left 09/22/2019   Procedure: OPEN REDUCTION INTERNAL FIXATION (ORIF) PROXIMAL TIBIA  FRACTURE;  Surgeon: Myrene Galas, MD;  Location: [facility] OR;  Service: Orthopedics;  Laterality: Left;   POLYPECTOMY  12/27/2020   Procedure: POLYPECTOMY;  Surgeon: Sherrilyn Rist, MD;  Location: [facility];  Service: Gastroenterology;;   SUBMUCOSAL TATTOO INJECTION  12/27/2020   Procedure: SUBMUCOSAL TATTOO INJECTION;  Surgeon: Sherrilyn Rist, MD;  Location: [facility];  Service: Gastroenterology;;   UTERINE ARTERY EMBOLIZATION  05/19/2008    Allergies: Allergies  Allergen Reactions   Sertraline Other (See Comments)    Chest pain/ increase blood pressure Other reaction(s): Unknown    Outpatient Meds: Current Facility-Administered Medications  Medication Dose Route Frequency Provider Last Rate Last Admin   sodium chloride flush (NS) 0.9 % injection 10 mL  10 mL Intravenous Q12H Danis, Starr [place] III, MD          ___________________________________________________________________ Objective   Exam:  BP (!) 145/95   Pulse 86   Temp 97.7 F (36.5 C) (Temporal)   Resp 16   Ht 5\' 3"  (1.6 m)   Wt 54.4 kg   LMP 11/26/2012   SpO2 (P) 93%   BMI 21.26 kg/m   CV: regular , S1/S2 Resp: clear to auscultation bilaterally, normal RR and effort noted GI: soft, no tenderness, with active bowel sounds.  Firm uterus (history fibroids)   Assessment: History of colon polyps   Plan: Colonoscopy   The benefits and risks of the planned procedure were  described in detail with the patient or (when appropriate) their health care proxy.  Risks were outlined as including, but not limited to, bleeding, infection, perforation, adverse medication reaction leading to cardiac or pulmonary decompensation, pancreatitis (if ERCP).  The limitation of incomplete mucosal visualization was also discussed.  No guarantees or warranties were given.  The patient is appropriate for an endoscopic procedure in the ambulatory setting.   - Amada Jupiter, MD

## 2023-07-11 NOTE — Discharge Instructions (Signed)

## 2023-07-11 NOTE — Transfer of Care (Signed)
Immediate Anesthesia Transfer of Care Note  Patient: Jessica Burke  Procedure(s) Performed: COLONOSCOPY WITH PROPOFOL POLYPECTOMY HEMOSTASIS CONTROL HEMOSTASIS CLIP PLACEMENT  Patient Location: Endoscopy Unit  Anesthesia Type:MAC  Level of Consciousness: awake, alert , and oriented  Airway & Oxygen Therapy: Patient Spontanous Breathing and Patient connected to nasal cannula oxygen  Post-op Assessment: Report given to RN and Post -op Vital signs reviewed and stable  Post vital signs: Reviewed and stable  Last Vitals:  Vitals Value Taken Time  BP 124/80 07/11/23 1127  Temp    Pulse    Resp 13 07/11/23 1128  SpO2    Vitals shown include unfiled device data.  Last Pain:  Vitals:   07/11/23 0855  TempSrc: Temporal  PainSc: 0-No pain         Complications: No notable events documented.

## 2023-07-11 NOTE — Op Note (Signed)
[facility] Patient Name: Jessica Burke Procedure Date : 07/11/2023 MRN: [id number] Attending MD: Starr Lake. 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 Myrtie Neither, MD, Suzy Bouchard, RN, Weston Settle, RN, Rozetta Nunnery, Technician Referring MD:              Medicines:                Monitored Anesthesia Care Complications:            No immediate complications. Estimated Blood Loss:     Estimated blood loss was minimal. Procedure:                Pre-Anesthesia Assessment:                           - Prior to the procedure, a History and Physical                            was performed, and patient medications and                            allergies were reviewed. The patient's tolerance of                            previous anesthesia was also reviewed. The risks                            and benefits of the procedure and the sedation  options and risks were discussed with the patient.                            All questions were answered, and informed consent                            was obtained. Prior Anticoagulants: The patient has                            taken no anticoagulant or antiplatelet agents. ASA                            Grade Assessment: III - A patient with severe                             systemic disease. After reviewing the risks and                            benefits, the patient was deemed in satisfactory                            condition to undergo the procedure.                           After obtaining informed consent, the colonoscope                            was passed under direct vision. Throughout the                            procedure, the patient's blood pressure, pulse, and                            oxygen saturations were monitored continuously. The                            CF-HQ190L ([id number] Olympus coloscope was                            introduced through the anus and advanced to the the                            cecum, identified by appendiceal orifice and                            ileocecal valve. The colonoscopy was performed with                            difficulty due to multiple diverticula in the                            colon, a redundant colon, significant looping and a  tortuous colon. Successful completion of the                            procedure was aided by using manual pressure and                            straightening and shortening the scope to obtain                            bowel loop reduction. The patient tolerated the                            procedure well. The quality of the bowel                            preparation was excellent. The ileocecal valve,                            appendiceal orifice, and rectum were photographed.                            The bowel preparation used was Suprep followed by                            [employer] (2 days). Scope In: 10:41:57 AM Scope Out: 11:21:50 AM Scope Withdrawal Time: 0 hours 33 minutes 39 seconds  Total Procedure Duration: 0 hours 39 minutes 53 seconds  Findings:      The perianal and digital rectal examinations were normal except for       decreased resting sphincter tone.      Repeat examination of right colon  under NBI performed.      A 15 mm polyp was found in the cecum. The polyp was multi-lobulated and       sessile. The polyp was removed with a piecemeal technique using a hot       and cold snare. Resection and retrieval were complete. To prevent       bleeding post-intervention, two hemostatic clips were successfully       placed (MR conditional). Clip manufacturer: [employer].      Diverticula were found in the entire colon.      A tattoo was seen in the descending colon. The tattoo site appeared       normal.      A diminutive polyp was found in the sigmoid colon. The polyp was       semi-sessile. The polyp was removed with a cold snare. Resection and       retrieval were complete.      Anal papilla(e) were hypertrophied.      The exam was otherwise without abnormality on direct and retroflexion       views. Impression:               - One 15 mm polyp in the cecum, removed piecemeal                            using a hot snare. Resected and retrieved. Clips                            (  MR conditional) were placed. Clip manufacturer:                            [employer].                           - Diverticulosis in the entire examined colon.                           - A tattoo was seen in the descending colon. The                            tattoo site appeared normal.                           - One diminutive polyp in the sigmoid colon,                            removed with a cold snare. Resected and retrieved.                           - Anal papilla(e) were hypertrophied.                           - The examination was otherwise normal on direct                            and retroflexion views. Recommendation:           - Patient has a contact number available for                            emergencies. The signs and symptoms of potential                            delayed complications were discussed with the                            patient. Return to normal  activities tomorrow.                            Written discharge instructions were provided to the                            patient.                           - Resume previous diet.                           - Continue present medications.                           - Await pathology results.                           - Repeat colonoscopy  is recommended for                            surveillance. The colonoscopy date will be                            determined after pathology results from today's                            exam become available for review.                           - The findings and recommendations were discussed                            with the patient's family. (daughter Homer                            phone)                           - The findings and recommendations were discussed                            with the patient. Procedure Code(s):        --- Professional ---                           [phone], Colonoscopy, flexible; with removal of                            tumor(s), polyp(s), or other lesion(s) by snare                            technique Diagnosis Code(s):        --- Professional ---                           Z86.010, Personal history of colonic polyps                           D12.0, Benign neoplasm of cecum                           D12.5, Benign neoplasm of sigmoid colon                           K62.89, Other specified diseases of anus and rectum                           K57.30, Diverticulosis of large intestine without                            perforation or abscess without bleeding CPT copyright 2022 [employer]. All rights reserved. The codes documented in this report are preliminary and upon coder review may  be revised to meet current compliance requirements. Sherilyn Cooter  Raynaldo Opitz, MD 07/11/2023 11:33:03 AM This report has been signed electronically. Number of Addenda: 0

## 2023-07-11 NOTE — Anesthesia Preprocedure Evaluation (Addendum)
Anesthesia Evaluation  Patient identified by MRN, date of birth, ID band Patient awake    Reviewed: Allergy & Precautions, NPO status , Patient's Chart, lab work & pertinent test results  History of Anesthesia Complications Negative for: history of anesthetic complications  Airway Mallampati: I  TM Distance: >3 FB Neck ROM: Full    Dental  (+) Dental Advisory Given, Missing   Pulmonary former smoker   breath sounds clear to auscultation       Cardiovascular hypertension, Pt. on medications (-) angina + CAD   Rhythm:Regular Rate:Normal  '18 stress:  1. No reversible ischemia or infarction.  2. Normal left ventricular wall motion.  3. Left ventricular ejection fraction 73%  '18 ECHO: EF mild LVH. Systolic function was normal.    EF 60% to 65%.    Wall motion was normal; there were no regional wall motion    abnormalities.  (grade 1 diastolic dysfunction).  - Aortic valve: Trileaflet. Sclerosis without stenosis. There was    trivial regurgitation.  - Mitral valve: Mildly thickened leaflets . There was trivial    regurgitation.  - Left atrium: The atrium was normal in size.  - Right atrium: The atrium was normal in size.  - Tricuspid valve: There was mild regurgitation.     Neuro/Psych    Depression       GI/Hepatic Neg liver ROS,GERD  Controlled,,  Endo/Other  negative endocrine ROS    Renal/GU negative Renal ROS     Musculoskeletal  (+) Arthritis ,  Polymyositis: wheelchair   Abdominal   Peds  Hematology negative hematology ROS (+)   Anesthesia Other Findings   Reproductive/Obstetrics                             Anesthesia Physical Anesthesia Plan  ASA: 3  Anesthesia Plan: MAC   Post-op Pain Management: Minimal or no pain anticipated   Induction:   PONV Risk Score and Plan: 2 and Treatment may vary due to age or medical condition  Airway Management Planned: Natural  Airway and Nasal Cannula  Additional Equipment: None  Intra-op Plan:   Post-operative Plan:   Informed Consent: I have reviewed the patients History and Physical, chart, labs and discussed the procedure including the risks, benefits and alternatives for the proposed anesthesia with the patient or authorized representative who has indicated his/her understanding and acceptance.     Dental advisory given  Plan Discussed with: CRNA and Surgeon  Anesthesia Plan Comments:         Anesthesia Quick Evaluation

## 2023-07-11 NOTE — Anesthesia Postprocedure Evaluation (Signed)
Anesthesia Post Note  Patient: Jessica Burke  Procedure(s) Performed: COLONOSCOPY WITH PROPOFOL POLYPECTOMY HEMOSTASIS CONTROL HEMOSTASIS CLIP PLACEMENT     Patient location during evaluation: Endoscopy Anesthesia Type: MAC Level of consciousness: awake and alert, patient cooperative and oriented Pain management: pain level controlled Vital Signs Assessment: post-procedure vital signs reviewed and stable Respiratory status: nonlabored ventilation, spontaneous breathing and respiratory function stable Cardiovascular status: blood pressure returned to baseline and stable Postop Assessment: no apparent nausea or vomiting Anesthetic complications: no   There were no known notable events for this encounter.  Last Vitals:  Vitals:   07/11/23 1140 07/11/23 1151  BP: 115/71 128/89  Pulse: 85 73  Resp: (!) 21 11  Temp:    SpO2: 100% 100%    Last Pain:  Vitals:   07/11/23 1151  TempSrc:   PainSc: 0-No pain                 [name],E. Zamani Crocker

## 2023-07-12 ENCOUNTER — Encounter (HOSPITAL_COMMUNITY): Payer: Self-pay | Admitting: Gastroenterology

## 2023-07-12 LAB — SURGICAL PATHOLOGY

## 2023-07-16 ENCOUNTER — Encounter: Payer: Self-pay | Admitting: Gastroenterology

## 2023-10-03 DIAGNOSIS — I10 Essential (primary) hypertension: Secondary | ICD-10-CM | POA: Diagnosis not present

## 2023-10-03 DIAGNOSIS — M3320 Polymyositis, organ involvement unspecified: Secondary | ICD-10-CM | POA: Diagnosis not present

## 2023-10-03 DIAGNOSIS — M810 Age-related osteoporosis without current pathological fracture: Secondary | ICD-10-CM | POA: Diagnosis not present

## 2023-10-03 DIAGNOSIS — E785 Hyperlipidemia, unspecified: Secondary | ICD-10-CM | POA: Diagnosis not present

## 2023-11-11 DIAGNOSIS — E7800 Pure hypercholesterolemia, unspecified: Secondary | ICD-10-CM | POA: Diagnosis not present

## 2023-11-11 DIAGNOSIS — M3320 Polymyositis, organ involvement unspecified: Secondary | ICD-10-CM | POA: Diagnosis not present

## 2023-11-11 DIAGNOSIS — R7303 Prediabetes: Secondary | ICD-10-CM | POA: Diagnosis not present

## 2023-11-11 DIAGNOSIS — I10 Essential (primary) hypertension: Secondary | ICD-10-CM | POA: Diagnosis not present

## 2023-11-11 DIAGNOSIS — M810 Age-related osteoporosis without current pathological fracture: Secondary | ICD-10-CM | POA: Diagnosis not present

## 2023-11-14 DIAGNOSIS — Z0000 Encounter for general adult medical examination without abnormal findings: Secondary | ICD-10-CM | POA: Diagnosis not present

## 2023-11-14 DIAGNOSIS — D849 Immunodeficiency, unspecified: Secondary | ICD-10-CM | POA: Diagnosis not present

## 2023-11-14 DIAGNOSIS — M3320 Polymyositis, organ involvement unspecified: Secondary | ICD-10-CM | POA: Diagnosis not present

## 2023-11-14 DIAGNOSIS — H259 Unspecified age-related cataract: Secondary | ICD-10-CM | POA: Diagnosis not present

## 2023-11-14 DIAGNOSIS — M810 Age-related osteoporosis without current pathological fracture: Secondary | ICD-10-CM | POA: Diagnosis not present

## 2023-11-14 DIAGNOSIS — I2510 Atherosclerotic heart disease of native coronary artery without angina pectoris: Secondary | ICD-10-CM | POA: Diagnosis not present

## 2023-11-14 DIAGNOSIS — L821 Other seborrheic keratosis: Secondary | ICD-10-CM | POA: Diagnosis not present

## 2023-12-04 DIAGNOSIS — M810 Age-related osteoporosis without current pathological fracture: Secondary | ICD-10-CM | POA: Diagnosis not present

## 2024-02-19 ENCOUNTER — Encounter: Payer: Self-pay | Admitting: Internal Medicine

## 2024-02-19 ENCOUNTER — Other Ambulatory Visit: Payer: Self-pay | Admitting: Obstetrics and Gynecology

## 2024-02-19 DIAGNOSIS — Z1231 Encounter for screening mammogram for malignant neoplasm of breast: Secondary | ICD-10-CM

## 2024-03-23 ENCOUNTER — Ambulatory Visit
Admission: RE | Admit: 2024-03-23 | Discharge: 2024-03-23 | Disposition: A | Discharge status: Home / Self Care | Source: Ambulatory Visit | Attending: Obstetrics and Gynecology | Admitting: Obstetrics and Gynecology

## 2024-03-23 DIAGNOSIS — Z1231 Encounter for screening mammogram for malignant neoplasm of breast: Secondary | ICD-10-CM | POA: Diagnosis not present

## 2024-03-26 ENCOUNTER — Ambulatory Visit: Payer: Self-pay | Admitting: Obstetrics and Gynecology

## 2024-04-30 DIAGNOSIS — M3320 Polymyositis, organ involvement unspecified: Secondary | ICD-10-CM | POA: Diagnosis not present

## 2024-04-30 DIAGNOSIS — M810 Age-related osteoporosis without current pathological fracture: Secondary | ICD-10-CM | POA: Diagnosis not present

## 2024-04-30 DIAGNOSIS — Z7409 Other reduced mobility: Secondary | ICD-10-CM | POA: Diagnosis not present

## 2024-04-30 DIAGNOSIS — M1990 Unspecified osteoarthritis, unspecified site: Secondary | ICD-10-CM | POA: Diagnosis not present

## 2024-05-02 DIAGNOSIS — Z9181 History of falling: Secondary | ICD-10-CM | POA: Diagnosis not present

## 2024-05-02 DIAGNOSIS — Z87891 Personal history of nicotine dependence: Secondary | ICD-10-CM | POA: Diagnosis not present

## 2024-05-02 DIAGNOSIS — M1990 Unspecified osteoarthritis, unspecified site: Secondary | ICD-10-CM | POA: Diagnosis not present

## 2024-05-02 DIAGNOSIS — M810 Age-related osteoporosis without current pathological fracture: Secondary | ICD-10-CM | POA: Diagnosis not present

## 2024-05-07 DIAGNOSIS — M810 Age-related osteoporosis without current pathological fracture: Secondary | ICD-10-CM | POA: Diagnosis not present

## 2024-05-13 DIAGNOSIS — Z9181 History of falling: Secondary | ICD-10-CM | POA: Diagnosis not present

## 2024-05-13 DIAGNOSIS — M810 Age-related osteoporosis without current pathological fracture: Secondary | ICD-10-CM | POA: Diagnosis not present

## 2024-05-13 DIAGNOSIS — M3320 Polymyositis, organ involvement unspecified: Secondary | ICD-10-CM | POA: Diagnosis not present

## 2024-05-13 DIAGNOSIS — Z87891 Personal history of nicotine dependence: Secondary | ICD-10-CM | POA: Diagnosis not present

## 2024-05-13 DIAGNOSIS — M1990 Unspecified osteoarthritis, unspecified site: Secondary | ICD-10-CM | POA: Diagnosis not present

## 2024-05-13 DIAGNOSIS — I10 Essential (primary) hypertension: Secondary | ICD-10-CM | POA: Diagnosis not present

## 2024-06-03 DIAGNOSIS — Z87891 Personal history of nicotine dependence: Secondary | ICD-10-CM | POA: Diagnosis not present

## 2024-06-03 DIAGNOSIS — Z9181 History of falling: Secondary | ICD-10-CM | POA: Diagnosis not present

## 2024-06-03 DIAGNOSIS — M810 Age-related osteoporosis without current pathological fracture: Secondary | ICD-10-CM | POA: Diagnosis not present

## 2024-06-03 DIAGNOSIS — M1990 Unspecified osteoarthritis, unspecified site: Secondary | ICD-10-CM | POA: Diagnosis not present

## 2024-06-03 DIAGNOSIS — M3320 Polymyositis, organ involvement unspecified: Secondary | ICD-10-CM | POA: Diagnosis not present

## 2024-06-05 DIAGNOSIS — M3320 Polymyositis, organ involvement unspecified: Secondary | ICD-10-CM | POA: Diagnosis not present

## 2024-06-05 DIAGNOSIS — M810 Age-related osteoporosis without current pathological fracture: Secondary | ICD-10-CM | POA: Diagnosis not present

## 2024-06-05 DIAGNOSIS — Z87891 Personal history of nicotine dependence: Secondary | ICD-10-CM | POA: Diagnosis not present

## 2024-06-05 DIAGNOSIS — M1990 Unspecified osteoarthritis, unspecified site: Secondary | ICD-10-CM | POA: Diagnosis not present

## 2024-06-10 DIAGNOSIS — M810 Age-related osteoporosis without current pathological fracture: Secondary | ICD-10-CM | POA: Diagnosis not present

## 2024-06-10 DIAGNOSIS — M1990 Unspecified osteoarthritis, unspecified site: Secondary | ICD-10-CM | POA: Diagnosis not present

## 2024-06-10 DIAGNOSIS — Z87891 Personal history of nicotine dependence: Secondary | ICD-10-CM | POA: Diagnosis not present

## 2024-06-10 DIAGNOSIS — Z9181 History of falling: Secondary | ICD-10-CM | POA: Diagnosis not present

## 2024-06-11 DIAGNOSIS — M1990 Unspecified osteoarthritis, unspecified site: Secondary | ICD-10-CM | POA: Diagnosis not present

## 2024-06-11 DIAGNOSIS — M3320 Polymyositis, organ involvement unspecified: Secondary | ICD-10-CM | POA: Diagnosis not present

## 2024-06-11 DIAGNOSIS — M810 Age-related osteoporosis without current pathological fracture: Secondary | ICD-10-CM | POA: Diagnosis not present

## 2024-06-11 DIAGNOSIS — Z9181 History of falling: Secondary | ICD-10-CM | POA: Diagnosis not present

## 2024-06-25 DIAGNOSIS — M810 Age-related osteoporosis without current pathological fracture: Secondary | ICD-10-CM | POA: Diagnosis not present

## 2024-06-25 DIAGNOSIS — Z9181 History of falling: Secondary | ICD-10-CM | POA: Diagnosis not present

## 2024-06-25 DIAGNOSIS — Z87891 Personal history of nicotine dependence: Secondary | ICD-10-CM | POA: Diagnosis not present

## 2024-06-25 DIAGNOSIS — M3320 Polymyositis, organ involvement unspecified: Secondary | ICD-10-CM | POA: Diagnosis not present

## 2024-06-25 DIAGNOSIS — M1990 Unspecified osteoarthritis, unspecified site: Secondary | ICD-10-CM | POA: Diagnosis not present

## 2024-06-26 DIAGNOSIS — M810 Age-related osteoporosis without current pathological fracture: Secondary | ICD-10-CM | POA: Diagnosis not present

## 2024-06-26 DIAGNOSIS — Z87891 Personal history of nicotine dependence: Secondary | ICD-10-CM | POA: Diagnosis not present

## 2024-06-26 DIAGNOSIS — M3320 Polymyositis, organ involvement unspecified: Secondary | ICD-10-CM | POA: Diagnosis not present

## 2024-06-26 DIAGNOSIS — Z9181 History of falling: Secondary | ICD-10-CM | POA: Diagnosis not present
